# Patient Record
Sex: Female | Born: 1975 | Race: White | Hispanic: No | Marital: Single | Smoking: Never smoker
Health system: Southern US, Community
[De-identification: ages and names within clinical notes are randomized; demographics above are authoritative.]

## PROBLEM LIST (undated history)

## (undated) DIAGNOSIS — I10 Essential (primary) hypertension: Secondary | ICD-10-CM

## (undated) DIAGNOSIS — M5431 Sciatica, right side: Secondary | ICD-10-CM

## (undated) DIAGNOSIS — M1990 Unspecified osteoarthritis, unspecified site: Secondary | ICD-10-CM

## (undated) DIAGNOSIS — K279 Peptic ulcer, site unspecified, unspecified as acute or chronic, without hemorrhage or perforation: Secondary | ICD-10-CM

## (undated) DIAGNOSIS — F32A Depression, unspecified: Secondary | ICD-10-CM

## (undated) DIAGNOSIS — D649 Anemia, unspecified: Secondary | ICD-10-CM

## (undated) DIAGNOSIS — K219 Gastro-esophageal reflux disease without esophagitis: Secondary | ICD-10-CM

## (undated) DIAGNOSIS — N938 Other specified abnormal uterine and vaginal bleeding: Secondary | ICD-10-CM

## (undated) DIAGNOSIS — M47816 Spondylosis without myelopathy or radiculopathy, lumbar region: Secondary | ICD-10-CM

## (undated) DIAGNOSIS — G4730 Sleep apnea, unspecified: Secondary | ICD-10-CM

## (undated) DIAGNOSIS — M722 Plantar fascial fibromatosis: Secondary | ICD-10-CM

## (undated) DIAGNOSIS — F419 Anxiety disorder, unspecified: Secondary | ICD-10-CM

## (undated) HISTORY — DX: Anxiety disorder, unspecified: F41.9

## (undated) HISTORY — DX: Depression, unspecified: F32.A

## (undated) HISTORY — DX: Plantar fascial fibromatosis: M72.2

## (undated) HISTORY — PX: NO PAST SURGERIES: SHX2092

## (undated) HISTORY — DX: Other specified abnormal uterine and vaginal bleeding: N93.8

## (undated) HISTORY — DX: Peptic ulcer, site unspecified, unspecified as acute or chronic, without hemorrhage or perforation: K27.9

## (undated) HISTORY — DX: Gastro-esophageal reflux disease without esophagitis: K21.9

## (undated) HISTORY — DX: Essential (primary) hypertension: I10

## (undated) HISTORY — DX: Anemia, unspecified: D64.9

## (undated) HISTORY — PX: COLONOSCOPY: SHX174

## (undated) HISTORY — DX: Unspecified osteoarthritis, unspecified site: M19.90

## (undated) HISTORY — DX: Spondylosis without myelopathy or radiculopathy, lumbar region: M47.816

## (undated) HISTORY — DX: Sciatica, right side: M54.31

---

## 1994-10-21 HISTORY — PX: CHOLECYSTECTOMY: SHX55

## 2007-06-05 ENCOUNTER — Inpatient Hospital Stay: Payer: Self-pay | Admitting: Obstetrics and Gynecology

## 2007-06-05 ENCOUNTER — Observation Stay: Payer: Self-pay

## 2010-07-10 ENCOUNTER — Emergency Department: Payer: Self-pay | Admitting: Emergency Medicine

## 2011-04-03 IMAGING — CR DG ANKLE COMPLETE 3+V*L*
1 series · 5 of 5 positions shown · non-contrast
Comparison: none

REASON FOR EXAM: injury
COMMENTS:

PROCEDURE:     DXR - DXR ANKLE LEFT COMPLETE  - July 10, 2010 [DATE]
RESULT:     No fracture, dislocation or other acute bony abnormality is
identified. The ankle mortise is well maintained.

[Series 1: view not recorded · 0.17mm/px · 5 of 5 slices shown]
[im 1/5]
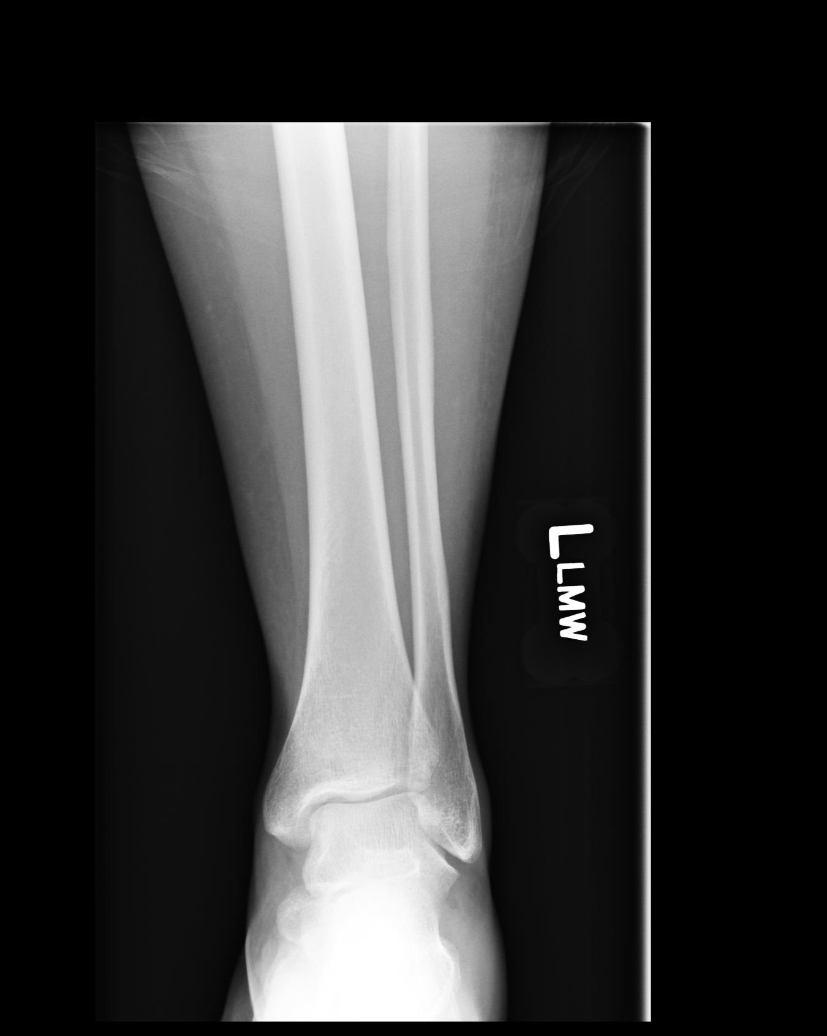
[im 2/5]
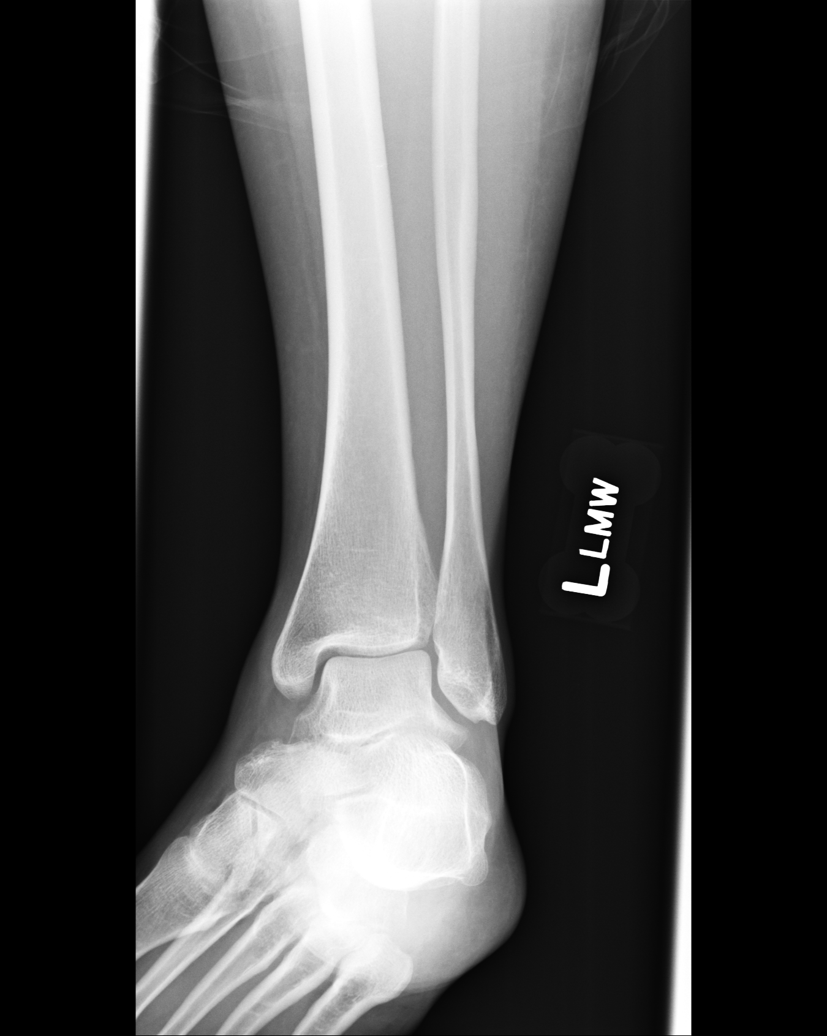
[im 3/5]
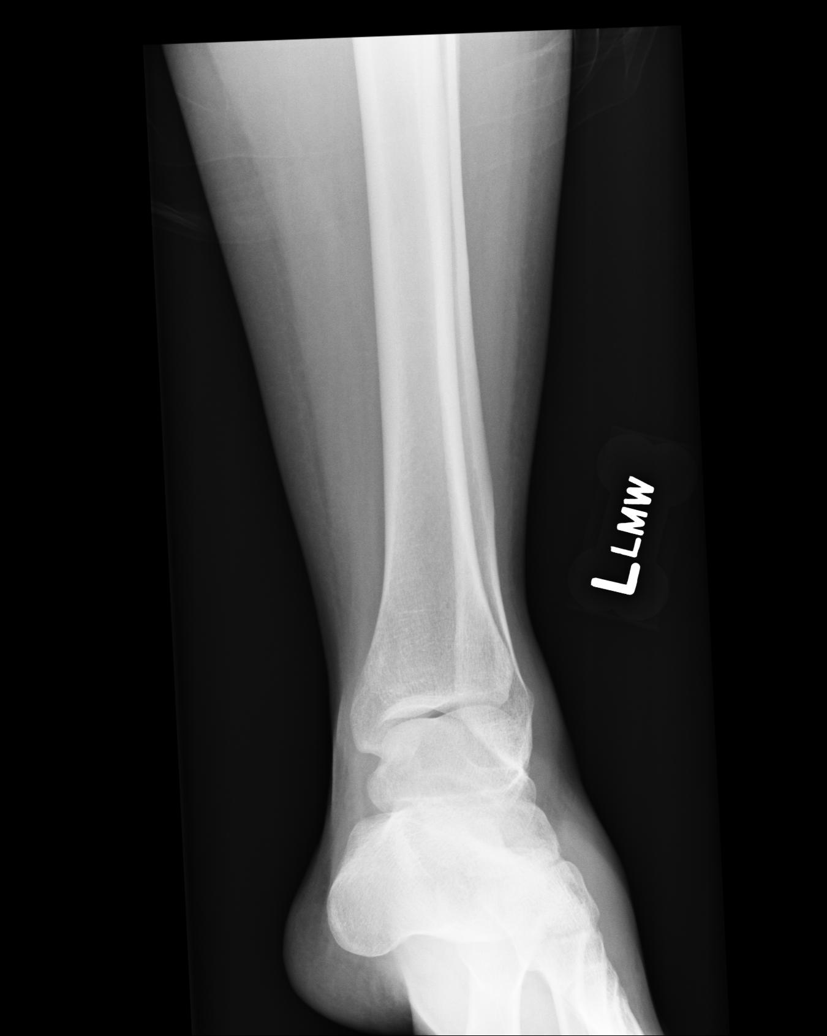
[im 4/5]
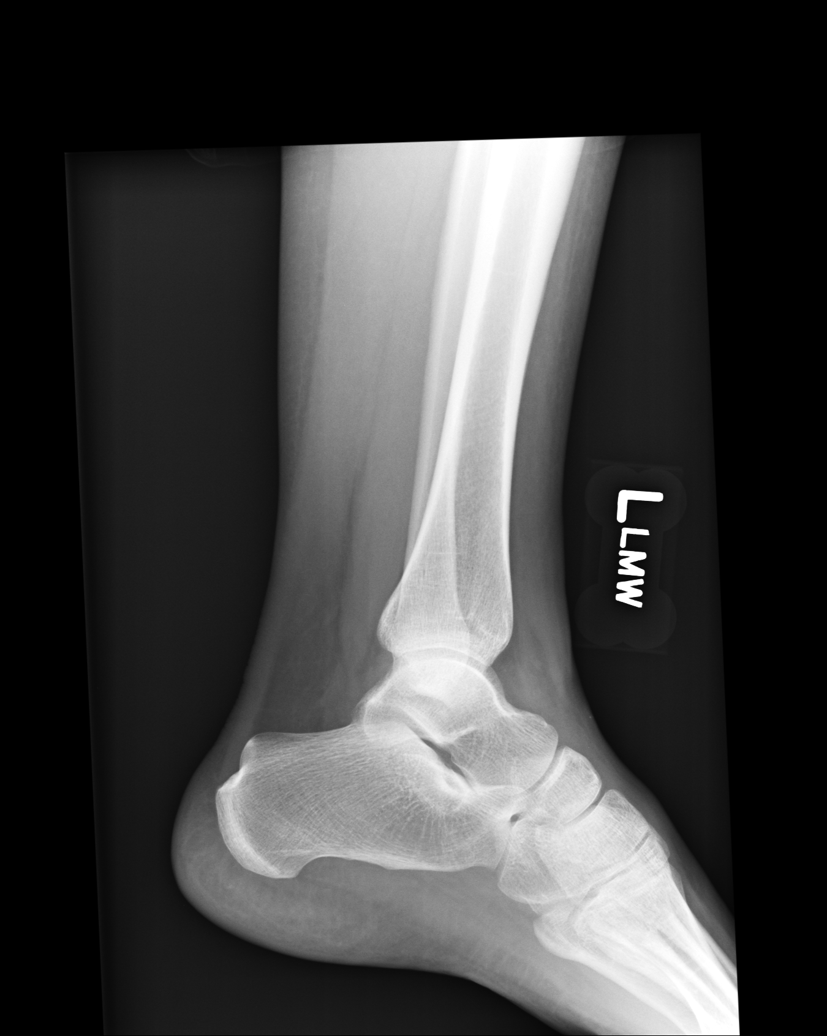
[im 5/5]
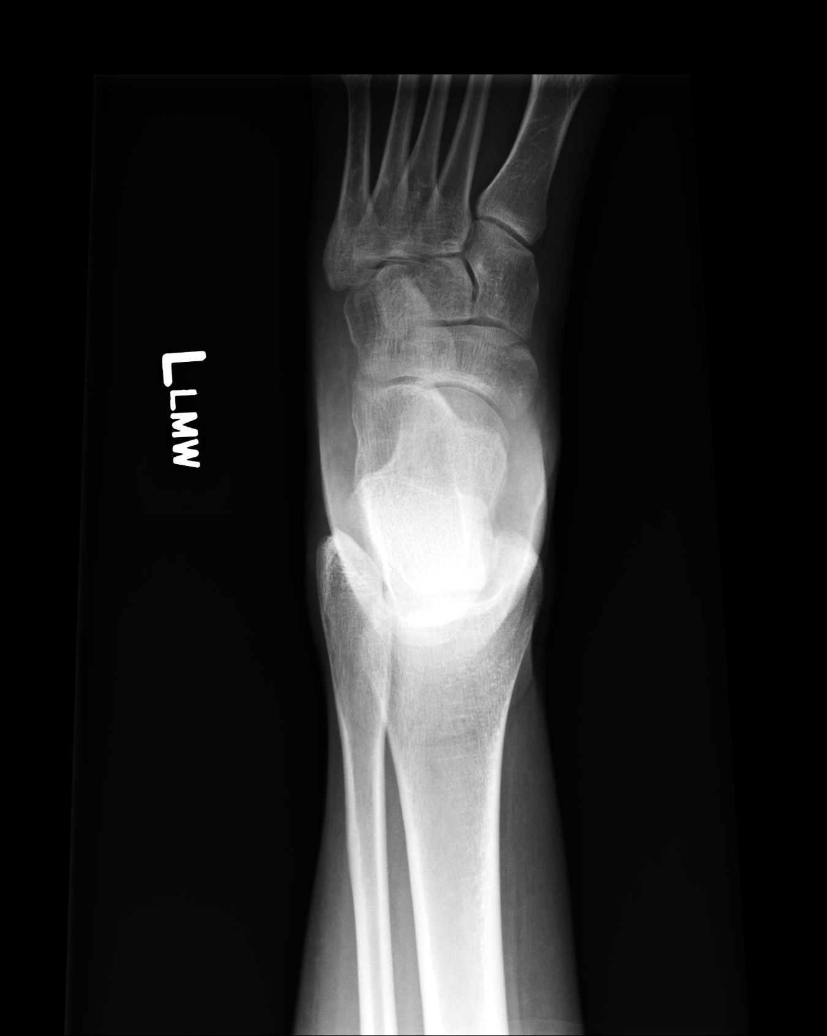

[5 of 5 positions shown; findings below may reference images not displayed]

IMPRESSION: No significant abnormalities are noted.

## 2015-01-24 HISTORY — PX: ANTERIOR CRUCIATE LIGAMENT REPAIR: SHX115

## 2015-10-04 ENCOUNTER — Other Ambulatory Visit: Payer: Self-pay | Admitting: Gastroenterology

## 2015-12-08 ENCOUNTER — Emergency Department
Admission: EM | Admit: 2015-12-08 | Disposition: A | Discharge status: Home / Self Care | Payer: Self-pay | Source: Ambulatory Visit | Attending: Emergency Medicine | Admitting: Emergency Medicine

## 2015-12-08 LAB — HM HIV SCREENING OFFERED

## 2015-12-08 MED ORDER — LIDOCAINE 5 % EX PTCH *I*
1.0000 | MEDICATED_PATCH | CUTANEOUS | Status: DC
Start: 2015-12-08 — End: 2015-12-09
  Administered 2015-12-08: 1 via TRANSDERMAL
  Filled 2015-12-08: qty 1

## 2015-12-08 MED ORDER — OXYCODONE HCL 5 MG PO TABS *I*
10.0000 mg | ORAL_TABLET | Freq: Once | ORAL | Status: DC
Start: 2015-12-08 — End: 2015-12-08

## 2015-12-08 MED ORDER — ACETAMINOPHEN 500 MG PO TABS *I*
1000.0000 mg | ORAL_TABLET | Freq: Once | ORAL | Status: DC
Start: 2015-12-08 — End: 2015-12-09

## 2015-12-08 NOTE — ED Notes (Signed)
Pt presents to the d with c/o chronic lower back and RT knee pain. Pt states she recently relocated from [place]. [place] and she doesn't have a PCP. Pt states she takes Percocet for chronic pain.

## 2015-12-08 NOTE — ED Triage Notes (Signed)
Right knee and back pain / chronic issues / just moved from [place]        Triage Note   Charlett Lango, RN

## 2015-12-08 NOTE — First Provider Contact (Signed)
ED Medical Screening Exam Note    Initial provider evaluation performed by   ED First Provider Contact     Date/Time Event User Comments    12/08/15 1541 ED Provider First Contact Elowen Debruyn, [facility] ANN Initial Face to Face Provider Contact        Patient states she just moved here from [place], hx back and knee pain, now bothering her again, no new injury  Vital signs reviewed.    Orders placed:  ANALGESIA     Patient requires further evaluation.     Mads Borgmeyer ANN Aquilla, NP, 12/08/2015, 3:41 PM    Supervising physician Dr Karleen Hampshire was immediately available     Valarie Cones, Chales Abrahams, NP  12/08/15 1542

## 2015-12-08 NOTE — Discharge Instructions (Signed)
You were seen in the Emergency Department for back pain and knee pain. Even though we did not find any life-threatening process that would require you to stay in the hospital during this visit, if you feel worse or are not improving, we are open 24 hours a day/7 days a week and would be happy to re-evaluate you. It is important that you follow up your visit with a primary care provider in the next 24-48 hours. Please continue taking your home medications as prescribed unless otherwise directed.       Please return to the Emergency Department if:    You have pain that is not relieved with rest or medicine.   You have increasing pain going down into the legs or buttocks.   You have pain that does not improve in one week.   You have night pain.   You lose weight.   You have a fever or chills.      Or you if develop Any worsening or new concerning symptoms    Please follow up with your primary care provider as soon as you can.  A referral to primary care has been made for you.    Thank you for letting us take part in your care today.

## 2015-12-08 NOTE — ED Provider Notes (Addendum)
History     Chief Complaint   Patient presents with    Knee Pain     HPI Comments: [name] is a 40 y.o. female who  has no past medical history on file.  [name] presents with acute exacerbation of her chronic right knee pain and right lumbar back pain.  She reports that this is because she has run out of her Percocet prescription because she recently moved here from [place] and has not been his able to establish care and primary care provider here in [place].  She denies any new trauma to her knee or back, she reports that her pain is her typical pain when not treated with Percocets, she states that she needs a referral to primary care.  He states that her pain is achy and severe.    she  denies: focal weakness, loss of sensation, saddle paresthesia, bowel or bladder incontinence, recent back surgery, IV drug use, waking from sleep due to pain or night sweats.              History provided by:  Patient  Language interpreter used: No      No past medical history on file.     No past surgical history on file.  No family history on file.    Social History    has no tobacco, alcohol, drug, and sexual activity history on file.    Living Situation     Questions Responses    Patient lives with     Homeless     Caregiver for other family member     External Services     Employment     Domestic Violence Risk           Problem List   There is no problem list on file for this patient.      Review of Systems   Review of Systems   Constitutional: Negative for chills and fever.   HENT: Negative for ear pain and sore throat.    Eyes: Negative for photophobia and visual disturbance.   Respiratory: Negative for chest tightness and shortness of breath.    Cardiovascular: Negative for chest pain and palpitations.   Gastrointestinal: Negative for abdominal pain, nausea and vomiting.   Genitourinary: Negative for difficulty urinating and dysuria.   Musculoskeletal: Positive for back pain and joint swelling.    Neurological: Negative for dizziness and light-headedness.   Psychiatric/Behavioral: Negative for confusion and decreased concentration.       Physical Exam     ED Triage Vitals   BP Heart Rate Heart Rate (via Pulse Ox) Resp Temp Temp src SpO2 O2 Device O2 Flow Rate   12/08/15 1543 12/08/15 1543 12/08/15 1543 12/08/15 1543 12/08/15 1543 12/08/15 1543 12/08/15 1543 12/08/15 1543 --   156/92 87 87 18 36.5 C (97.7 F) TEMPORAL 99 % None (Room air)       Weight           12/08/15 1543           86.2 kg (190 lb)                    Physical Exam   Constitutional: She is oriented to person, place, and time. She appears well-developed and well-nourished. No distress.   HENT:   Head: Normocephalic and atraumatic.   Eyes: EOM are normal. Pupils are equal, round, and reactive to light.   Neck: Normal range of motion. Neck supple.  Cardiovascular: Normal rate and regular rhythm.    Pulmonary/Chest: Effort normal and breath sounds normal.   Abdominal: Soft. Bowel sounds are normal. She exhibits no distension. There is no tenderness.   Musculoskeletal: Normal range of motion. She exhibits tenderness (Of paraspinal lumbar musculature on the right.). She exhibits no edema.   Neurological: She is alert and oriented to person, place, and time.   Skin: Skin is warm and dry. She is not diaphoretic.   Nursing note and vitals reviewed.      Medical Decision Making        Initial Evaluation:  ED First Provider Contact     Date/Time Event User Comments    12/08/15 1541 ED Provider First Contact Glennon Mac ANN Initial Face to Face Provider Contact          Patient seen by me today 12/08/2015 at 1630    Assessment:  39 y.o.female comes to the ED with acute exacerbation of chronic back pain due to having run out of her Percocet prescription.    Denies any traumas, new symptoms or acutely concerning issues with back pain.    Differential includes: strain/sprain, osteoarthritis, vertebral fracture (no mechanism to suggest this), central  disc herniation (no extremity involvement, neurologically intact), discitis/epidural abscess (unlikely as patient is afebrile, nontoxic appearance, no posterior erythema, warmth or midline tenderness; no history of IVDU), cauda equina syndrome/cord compression (no bowel or bladder incontinence, normal perineal sensation)    Abdominal exam reassuring, doubt referred pain from intraabdominal pathology. H&P not consistent with renal colic. No rash to suggest zoster. Pain mechanically reproducible.     Findings consistent with lumbar muscular strain, ligamentous sprain. Clinically, acute on chronic back pain. No new symptoms that suggest advanced imaging required.      PLAN:  Disposition:  Patient is safe and stable for discharge.  We'll provide patient with referral to primary care to continue treatment here in [place] for her chronic back pain.  Patient is ambulatory.  Patient has phone she can call 911 with if needed.  Patient will be provided with return precautions prior to discharge.  She is amenable to this plan.        Renaldo Fiddler, DO         Renaldo Fiddler, DO  Resident  12/08/15 2251      Resident Attestation:     Patient seen by me on arrival date of 12/08/2015 at 1817    History:   I reviewed this patient, reviewed the resident's note and agree.  Exam:   I examined this patient, reviewed the resident's note and agree.    Decision Making:   I discussed with the resident his/her documented decision making  and agree.    This is a 40 year old female presenting with chronic right knee and lower back pain.  She is usually followed in [place] recently moved here and does not have a primary care provider.  She states that she is not able to get her insurance for at least several weeks and didn't know where else to go to get her chronic pain medications.  Patient has been without these medications reportedly for several weeks, since the beginning of the month.  She has no focal findings on  examination though she does have scoliosis and some paraspinal muscle spasms.  I discussed with the patient in the emergency department is not the place to come for chronic pain management however I would give her lidocaine patch and ibuprofen as needed.  She  verbalized understanding and agreement with plan and she was given PCP follow-up instructions and was discharged home.    Author Jasper Loser, MD       Jasper Loser, MD  12/09/15 [phone]

## 2015-12-08 NOTE — Progress Notes (Signed)
[facility] SOCIAL WORK  PHARMACY FORM     Todays date:  December 08, 2015    Patient Name: [name]      Medical Record #: [id number]   DOB: [birth year]  Patients Address: [address] [address]                Social Worker: Annamary Rummage, LMSW       Date of Service: December 08, 2015       Funding Source: [employer] Medication Assistance Fund  ___________________________________________________________________    Pharmacy Information:  Date/time sent: December 08, 2015     Time needed: asap    Patient Location: ED    Medication Pick-up Preference: Patient will pick up at the pharmacy    Pharmacy Contact:Donna  Social work signature: Annamary Rummage, LMSW  Supervisor/Manager Approval (if indicated):  Date:  (supervisor signature not required for Medicaid pending)

## 2015-12-09 ENCOUNTER — Encounter: Payer: Self-pay | Admitting: Emergency Medicine

## 2015-12-15 ENCOUNTER — Ambulatory Visit: Payer: Self-pay | Admitting: Primary Care

## 2015-12-15 ENCOUNTER — Encounter: Payer: Self-pay | Admitting: Primary Care

## 2015-12-15 ENCOUNTER — Telehealth: Payer: Self-pay

## 2015-12-15 VITALS — BP 140/82 | HR 74 | Ht 69.0 in | Wt 195.0 lb

## 2015-12-15 DIAGNOSIS — F32A Depression, unspecified: Secondary | ICD-10-CM

## 2015-12-15 DIAGNOSIS — W57XXXA Bitten or stung by nonvenomous insect and other nonvenomous arthropods, initial encounter: Secondary | ICD-10-CM

## 2015-12-15 DIAGNOSIS — M5431 Sciatica, right side: Secondary | ICD-10-CM | POA: Insufficient documentation

## 2015-12-15 DIAGNOSIS — M419 Scoliosis, unspecified: Secondary | ICD-10-CM | POA: Insufficient documentation

## 2015-12-15 DIAGNOSIS — N938 Other specified abnormal uterine and vaginal bleeding: Secondary | ICD-10-CM

## 2015-12-15 DIAGNOSIS — M545 Low back pain, unspecified: Secondary | ICD-10-CM | POA: Insufficient documentation

## 2015-12-15 DIAGNOSIS — M47816 Spondylosis without myelopathy or radiculopathy, lumbar region: Secondary | ICD-10-CM

## 2015-12-15 DIAGNOSIS — D649 Anemia, unspecified: Secondary | ICD-10-CM | POA: Insufficient documentation

## 2015-12-15 DIAGNOSIS — K279 Peptic ulcer, site unspecified, unspecified as acute or chronic, without hemorrhage or perforation: Secondary | ICD-10-CM | POA: Insufficient documentation

## 2015-12-15 DIAGNOSIS — I10 Essential (primary) hypertension: Secondary | ICD-10-CM | POA: Insufficient documentation

## 2015-12-15 DIAGNOSIS — G8929 Other chronic pain: Secondary | ICD-10-CM | POA: Insufficient documentation

## 2015-12-15 MED ORDER — MIRTAZAPINE 30 MG PO TABS *I*
30.0000 mg | ORAL_TABLET | Freq: Every evening | ORAL | 5 refills | Status: DC
Start: 2015-12-15 — End: 2016-01-12

## 2015-12-15 MED ORDER — GABAPENTIN 300 MG PO CAPSULE *I*
300.0000 mg | ORAL_CAPSULE | Freq: Three times a day (TID) | ORAL | 5 refills | Status: DC
Start: 2015-12-15 — End: 2015-12-15

## 2015-12-15 MED ORDER — GABAPENTIN 300 MG PO CAPSULE *I*
900.0000 mg | ORAL_CAPSULE | Freq: Three times a day (TID) | ORAL | 5 refills | Status: DC
Start: 2015-12-15 — End: 2015-12-23

## 2015-12-15 MED ORDER — CYCLOBENZAPRINE HCL 10 MG PO TABS *I*
10.0000 mg | ORAL_TABLET | Freq: Three times a day (TID) | ORAL | 5 refills | Status: DC | PRN
Start: 2015-12-15 — End: 2016-01-12

## 2015-12-15 MED ORDER — AMLODIPINE BESYLATE 10 MG PO TABS *I*
10.0000 mg | ORAL_TABLET | Freq: Every day | ORAL | 5 refills | Status: DC
Start: 2015-12-15 — End: 2016-01-12

## 2015-12-15 MED ORDER — BUPROPION HCL 150 MG PO TB12 *I*
150.0000 mg | ORAL_TABLET | Freq: Two times a day (BID) | ORAL | 5 refills | Status: DC
Start: 2015-12-15 — End: 2016-01-12

## 2015-12-15 MED ORDER — FERROUS SULFATE 325 (65 FE) MG PO TABS *WRAPPED* *I*
325.0000 mg | ORAL_TABLET | Freq: Every day | ORAL | 3 refills | Status: DC
Start: 2015-12-15 — End: 2016-01-12

## 2015-12-15 MED ORDER — OMEPRAZOLE 40 MG PO CPDR *I*
40.0000 mg | DELAYED_RELEASE_CAPSULE | Freq: Every day | ORAL | 5 refills | Status: DC
Start: 2015-12-15 — End: 2016-01-12

## 2015-12-15 MED ORDER — LISINOPRIL 10 MG PO TABS *I*
10.0000 mg | ORAL_TABLET | Freq: Every day | ORAL | 5 refills | Status: DC
Start: 2015-12-15 — End: 2016-01-12

## 2015-12-15 NOTE — Telephone Encounter (Addendum)
Ms. Mallery is calling to schedule an appointment with office. The patient would like to be seen for NPV, peptic ulcer, next available.  Ms. Bettcher was scheduled for 6-7, Sherri Rad, wait listed. Please call the patient to schedule at [phone].  Thank you.

## 2015-12-15 NOTE — Progress Notes (Signed)
[facility] - Outpatient Progress Note  New Patient Visit    SUBJECTIVE    CC:  Pt here to establish care and discuss New Patient Visit; Back Pain (bulging disc and scoliosis, used to take percocet doesn't have any more); Hypertension; Depression; and Bed Bugs (staying in shelter)    Previous PCP:  Sharee Pimple, Family Doctor     Recently moved to [place] from [place].  Present with fianc Willie at today's visit.    1. HTN (hypertension)    2. DJD (degenerative joint disease), lumbar    3. Chronic low back pain    4. Sciatica of right side    5. Scoliosis of lumbar spine    6. Depression, unspecified depression type    7. Peptic ulcer    8. Anemia, unspecified type    9. DUB (dysfunctional uterine bleeding)      HTN:   Diagnosed approx 5 years ago.  Takes Amlodipine and Lisinopril.  Compliant every day.    R ACL tear:  Fell at work.  Repaired 01/24/15.  Still has buckling and pain with weather changes. Completed 10 months of PT with some improvement.    Chronic low back pain/Sciatica:  Made worse when she tore her R ACL due to compensated walking.  Managed in [place] by neurology and had prior injections, PT and TENS unit.  Was going to consider nerve ablation prior to her moving here.  Still feels pain with catching during certain movements.  Had sciatica initially but now can better control with PT exercises.  Takes Gabapentin and Flexeril.  Was also given Percocet 10-325 mg.       MRI reviewed from 03/17/15 shows thoracolumbar scoliosis with multiple small disc abnormalities as described.  No significant canal stenosis.  Generalized overall mild posterior element DJD.    Snoring:  Snores loudly. Wakes up not rested. Was going to go for sleep study but moved.    Peptic ulcer:  Diagnosed by EGD in  2011.  Takes daily omeprazole daily.    Chest pain:   Feels like knife in the middle of her chest every other month.  Lasting 15-20 min.  Rest makes the pain better.  Moving makes pain  worse. Denies palpitations.  Denies LE swelling.  Has been worked up and had EGD showed ulcer and was told her pain was from the ulcer.  She cannot correlate the pain with food.  Pain comes out of nowhere.  Eating bread helps with pain.    Depression/Anxiety:  Takes Remeron and Wellbutrin.  Helps greatly with mood. Denies any SI.    Anemia:  Since childhood.  Has heavy bleeding. Takes Iron.    DUB:  Has irregular heavy bleeding.  Had tubal ligation.  Has known fibroids. Was going to be given 'hormones to knock out period' prior to moving but never took them.    Hordoleum:  L eyelid 'spot' that sometimes leaks pus.  Was supposed to go and see an eye doctor prior to moving.    Hx of colon cancer in father:  Diagnosed at 48. Died at 68.  She has not yet had a colonoscopy.    Bedbugs: Being bitten by bedbugs at the shelter she is currently staying.  Has application in for section 8 housing that should take a couple of months.    Social:  Moved recently from [place]. [place] to 'start over'.  Has a cousin in [place].  Currently in a shelter and has  pending disability case for low back pain secondary to DJD, small disc disease and sciatica. Here with fiance Huel Coventry, partner of 2 years. 5 children living with grandparents currently as she was unable to properly care for them.    The patient's medication list was populated in eRecord and reviewed by myself today.  These medications include:    Current Outpatient Prescriptions:     amLODIPine (NORVASC) 10 MG tablet, Take 1 tablet (10 mg total) by mouth daily, Disp: 30 tablet, Rfl: 5    buPROPion (WELLBUTRIN SR) 150 MG 12 hr tablet, Take 1 tablet (150 mg total) by mouth 2 times daily   Swallow whole. Do not crush, break, or chew., Disp: 30 tablet, Rfl: 5    cyclobenzaprine (FLEXERIL) 10 MG tablet, Take 1 tablet (10 mg total) by mouth 3 times daily as needed for Muscle spasms, Disp: 30 tablet, Rfl: 5    ferrous sulfate 325 (65 FE) MG tablet, Take 1 tablet (325 mg total) by  mouth daily (with breakfast), Disp: 100 tablet, Rfl: 3    lisinopril (PRINIVIL,ZESTRIL) 10 MG tablet, Take 1 tablet (10 mg total) by mouth daily, Disp: 30 tablet, Rfl: 5    mirtazapine (REMERON) 30 MG tablet, Take 1 tablet (30 mg total) by mouth nightly, Disp: 30 tablet, Rfl: 5    omeprazole (PRILOSEC) 40 MG capsule, Take 1 capsule (40 mg total) by mouth daily, Disp: 30 capsule, Rfl: 5    gabapentin (NEURONTIN) 300 MG capsule, Take 3 capsules (900 mg total) by mouth 3 times daily, Disp: 150 capsule, Rfl: 5    oxyCODONE-acetaminophen (PERCOCET) 10-325 MG per tablet, Take 1 tablet by mouth every 4-6 hours as needed for Pain, Disp: , Rfl:       Allergies were populated in eRecord and reviewed by myself today.  These allergies include:  No Known Allergies (drug, envir, food or latex)      Past medical, surgical, social and family histories were updated in eRecord and reviewed by myself today.  This history includes:    Past Medical History:   Diagnosis Date    Anemia     Arthritis     Depression     DJD (degenerative joint disease), lumbar     small disc disease    DUB (dysfunctional uterine bleeding)     GERD (gastroesophageal reflux disease)     Hypertension     Peptic ulcer     Sciatica of right side        Past Surgical History:   Procedure Laterality Date    ANTERIOR CRUCIATE LIGAMENT REPAIR Right 01/24/2015    CESAREAN SECTION, LOW TRANSVERSE  2007    CHOLECYSTECTOMY  1996       Family History   Problem Relation Age of Onset    Brain cancer Mother     Colon cancer Father 86    Asthma Brother        Social History:   reports that she has been smoking.  She has been smoking about 0.25 packs per day. She has never used smokeless tobacco. She reports that she currently engages in sexual activity and has had female partners. She reports using the following method of birth control/protection: Surgical. She reports that she does not drink alcohol or use illicit drugs.      ROS: negative except where  bolded  Gen:  No headaches, dizziness and feels generally well  Eyes: No recent visual changes  HENT:  No nasal discharge, throat pain, hearing  unchanged  CV:  No palpitations or chest pain, peripheral edema or claudication  Resp: No wheezing or shortness of breath  GI:  No nausea, vomiting; eating and drinking normally  GU:  Normal urination and bowel movements  MSK: No joint or muscle aches, normal gait  Skin:  No new rashes or lesions  Neuro: No paresthesias  Psych:  Mood stable  Endo: No extraordinary fatigue, skin/hair changes  Heme: No easy bruising    OBJECTIVE    Blood pressure 140/82, pulse 74, height 1.753 m ( ), weight 88.5 kg (195 lb).      PHYSICAL EXAM:    Vitals as noted; WD alert and in NAD   Psych:  A&Ox3; normal mood and affect   Eyes:  Conjunctiva clear, lids normal   PERRLA with otoscope   ENMT:  TMs normal with limited cerumen in canal   Normal dentition and no lesions of lips   Neck:  Trachea midline, no masses palpated, no thyromegaly   Lymph:  no submandibular or supraclavicular LAD   Resp:  Normal effort; and clear to auscultation without wheeze   Cards: S1S2 regular without murmur; no pedal edema   Abd:  Soft, NT without obvious masses, no hepatosplenomegaly, and NABS   Normal patellar reflex with nonantalgic gait   MSK: normal forward flexion, limited lateral bending on R and L, obvious lumbar scoliosis with L hip higher than left hip, TTP along lumbar spine and R paraspinal muscles, pos SLR R   Skin: scattered erythematous papules over face and neck      ASSESSMENT & PLAN    1. HTN (hypertension)  At upper end of goal during today's visit however patient in pain.  Will continue to monitor clinically.  Refill medications as below.  - amLODIPine (NORVASC) 10 MG tablet; Take 1 tablet (10 mg total) by mouth daily  Dispense: 30 tablet; Refill: 5  - lisinopril (PRINIVIL,ZESTRIL) 10 MG tablet; Take 1 tablet (10 mg total) by mouth daily  Dispense: 30 tablet; Refill: 5    2. DJD  (degenerative joint disease), lumbar/ Chronic low back pain/R sciatica/Scoliosis  Homero Fellers discussion with patient that I'll not be prescribing narcotics for pain to which she agrees without any objection.  Increase gabapentin to 900 mg 3 times a day.  Referral to pain management for alternative modalities for pain management  AMB REFERRAL TO PAIN TREATMENT  - gabapentin (NEURONTIN) 300 MG capsule; Take 3 capsules (900 mg total) by mouth 3 times daily  Dispense: 150 capsule; Refill: 5    3. Depression, unspecified depression   Stable.  Refill meds as below.  -buPROPion (WELLBUTRIN SR) 150 MG 12 hr tablet; Take 1 tablet (150 mg total) by mouth 2 times daily   Swallow whole. Do not crush, break, or chew.  Dispense: 30 tablet; Refill: 5  - mirtazapine (REMERON) 30 MG tablet; Take 1 tablet (30 mg total) by mouth nightly  Dispense: 30 tablet; Refill: 5    4. Hx of Peptic ulcer?/ Family history of early colon cancer in father   Requested records for review.  Continue daily omeprazole.  Referral to GI for endoscopy and colonoscopy.  -omeprazole (PRILOSEC) 40 MG capsule; Take 1 capsule (40 mg total) by mouth daily  Dispense: 30 capsule; Refill: 5  - AMB REFERRAL TO GASTROENTEROLOGY    5. Anemia  Will obtain labs .  Cont with iron supp.    6. DUB (dysfunctional uterine bleeding)  Will likely require obgyn referral.  Will address  in more depth at future visit.  Records requested for review.    7. Bug bites:  Advised discussing with Dealer.  Steroid cream and benadryl prn.    RTC:  1 month multiple issues    Marin Olp, MD

## 2015-12-18 ENCOUNTER — Telehealth: Payer: Self-pay | Admitting: Primary Care

## 2015-12-18 DIAGNOSIS — M545 Low back pain, unspecified: Secondary | ICD-10-CM

## 2015-12-18 DIAGNOSIS — G8929 Other chronic pain: Secondary | ICD-10-CM

## 2015-12-18 NOTE — Telephone Encounter (Signed)
Prior authorization request for Gabapentin received from [facility]. Placing in nurses' bin.

## 2015-12-18 NOTE — Telephone Encounter (Signed)
Patient called because she was told she needs prior authorization for Gabapentin, and states she is still in a lot of pain. She also wanted to inform the doctor that she called pain management and they said they would call her once they looked over her "records".      She can be reached at [phone] temporarily.

## 2015-12-18 NOTE — Telephone Encounter (Signed)
Frequency of the dose (3x daily) for gabapentin 300 mg may be triggering the PA, but it may require one anyway. No obvious alternatives on the formulary. Form started, placed on provider's desk.

## 2015-12-18 NOTE — Telephone Encounter (Signed)
Prior authorization request for Omeprazole received from [facility]. Placing in nurses' bin.

## 2015-12-18 NOTE — Telephone Encounter (Signed)
Review of Medicaid formulary shows that the dose of 40 mg is triggering the PA. Given the diagnosis of peptic ulcer, this should go through. Form started, placed on provider's desk

## 2015-12-21 ENCOUNTER — Telehealth: Payer: Self-pay | Admitting: Primary Care

## 2015-12-21 NOTE — Telephone Encounter (Signed)
[name] is calling to check the status of her appointment request. I see an NPV scheduled for 03/26/16 with Dorathy Daft  [name]'s provider is requesting for this to be an Urgent referral for her to be seen sooner than that.   Please return her call at [phone] or [phone] (at the [facility]) to discuss.   Connected to RIM

## 2015-12-21 NOTE — Telephone Encounter (Signed)
Prior auth filled out for gabapentin and will be sent today.  Checked of expedited review.    I need patient's prior records in order for me to better assess pain quality and how to treat.  For now all I am comfortable prescribing is the gabapentin that needs the prior auth.    Would front staff call patient.    Marin Olp, MD

## 2015-12-21 NOTE — Telephone Encounter (Signed)
Pt called to let dr Ezzie Dural know that pain mgmt wont see her until they get her previous  Pain dr records( faxed ROI earlier in week to all that you had wanted) she is in a lot  Of pain,only medicine she could get was the flexual,but she is in a [facility] shelter and it  Just puts her to sleep. She is wondering if you can prescribe her anything else for her pain?  Please advise.thanks

## 2015-12-21 NOTE — Telephone Encounter (Signed)
Returned call, left message on vcm, called [employer], placed on hold then call disconnected

## 2015-12-21 NOTE — Telephone Encounter (Signed)
Patient has returned call regarding an what she reports to be an urgent referral. She has asked that we call her back at [phone] to move the 6/6 appointment sooner. The patient gives permission to leave a new appointment date/time on her voicemail.

## 2015-12-21 NOTE — Telephone Encounter (Signed)
[name] is returning a call from Nettie. She states this is regarding scheduling a sooner appointment. She is requesting a call back at [phone].  Thank you.

## 2015-12-21 NOTE — Telephone Encounter (Signed)
Prior auth filled out and placed in nurses bin.  Checked off as urgent.    Marin Olp, MD

## 2015-12-21 NOTE — Telephone Encounter (Signed)
PA faxed to [phone], awaiting response.

## 2015-12-21 NOTE — Telephone Encounter (Signed)
Called pt back to let her know dr's msg. She was thankful,and would like a call  Back when we receive her records/medication response from [employer].

## 2015-12-22 ENCOUNTER — Telehealth: Payer: Self-pay | Admitting: Primary Care

## 2015-12-22 NOTE — Telephone Encounter (Signed)
Approval of gabapentin 300 mg capsules received from [employer]/Medicaid, good until 06/23/2016. Placed in nursing staff inbox.

## 2015-12-22 NOTE — Telephone Encounter (Signed)
Approval of gabapentin 300 mg capsules received from [employer]/Medicaid, good until 06/23/2016. Placed in nursing staff inbox.

## 2015-12-22 NOTE — Telephone Encounter (Signed)
Approval message below should be added to current PA encounter please

## 2015-12-22 NOTE — Telephone Encounter (Signed)
Patient received her script for Gabapentin, and it states to take 1 pill three times daily instead of 3 tablets three times daily. Patient states she was told/her AVS said 3 tablets three times a day. They only gave her 90 tablets instead of 150.    She also states that pain management cannot see her without her records from [place]. Writer advised her to call her [facility]'s [facility] and check on the status of the records being sent.    She is requesting a call back about the Gabapentin at [phone].

## 2015-12-22 NOTE — Telephone Encounter (Signed)
This is encounter for omeprazole, please docu gaba approval in other PA encounter

## 2015-12-22 NOTE — Telephone Encounter (Signed)
Records received [facility], includes information from Dr. Vangie BickerNaso. Placed in provider's inbox.

## 2015-12-22 NOTE — Telephone Encounter (Signed)
Received approval for Gabapentin 300mg  beginning 12/21/15 ending 06/23/16.

## 2015-12-22 NOTE — Telephone Encounter (Signed)
PA faxed to Medicaid at [phone]. Awaiting response.

## 2015-12-23 MED ORDER — GABAPENTIN 300 MG PO CAPSULE *I*
900.0000 mg | ORAL_CAPSULE | Freq: Three times a day (TID) | ORAL | 5 refills | Status: DC
Start: 2015-12-23 — End: 2016-01-12

## 2015-12-23 NOTE — Addendum Note (Signed)
Addended by: Marin OlpLIS-HYJEK, Montine Hight on: 12/23/2015 10:58 AM     Modules accepted: Orders

## 2015-12-23 NOTE — Telephone Encounter (Signed)
Medication 300 mg, 3 capsules tid resent to pharmacy.    Marin OlpKarolina Lis-Hyjek, MD

## 2015-12-23 NOTE — Telephone Encounter (Signed)
Called patient 3 x yesterday and eventually left VM stating she could take the gabapentin 900 mg tid as discussed during our visit.  Prior authorization must have gone through with wrong dosage.    Will need to redo prior auth on Monday.      Message sent to nurses.    Marin OlpKarolina Lis-Hyjek, MD

## 2015-12-27 NOTE — Telephone Encounter (Signed)
Called Medicaid at [phone], this has been approved, good until 06/28/2016. [facility] [address], left VM that this has been approved, asking them to contact the patient once this is ready.

## 2016-01-05 ENCOUNTER — Telehealth: Payer: Self-pay | Admitting: Pain Medicine

## 2016-01-05 ENCOUNTER — Telehealth: Payer: Self-pay | Admitting: Primary Care

## 2016-01-05 NOTE — Telephone Encounter (Addendum)
Pt called to let dr know that she cant function on 900 mg of gabapentin 3x a day.  She says it messes with her equilibrium ,and doesn't know if she can keep taking it.  And asked about pain mgmt referral, ( i told her i would call) called them and they confirmed  Received records. Will forward to NP and they will call and get her scheduled.

## 2016-01-05 NOTE — Telephone Encounter (Signed)
Additional info you were requesting before accepting patient has been scanned under her media.  Please advise.

## 2016-01-08 NOTE — Telephone Encounter (Signed)
I reviewed previous records.  Before we see her for her back pain, it looks like she needs to see gyn for hemorrhagic cysts that may be contributing to her back pain.    Once she has seen GYN, we can consider seeing her.

## 2016-01-08 NOTE — Telephone Encounter (Signed)
Would nursing staff please tell patient that pain management has rejected the referral until we can get patient seen for possible hemorrhagic cysts that may be contributing to her back pain.    She can wait to discuss this on our 3/27 appointment or if she would like can come in for an acute this week.    Thanks,  Marin OlpKarolina Lis-Hyjek, MD

## 2016-01-09 ENCOUNTER — Ambulatory Visit: Payer: Self-pay | Admitting: Primary Care

## 2016-01-09 NOTE — Telephone Encounter (Signed)
Called and left voice message for patient to call.

## 2016-01-09 NOTE — Telephone Encounter (Signed)
Patient notified of message below, patient verbalized understanding.  Appointment scheduled for today.

## 2016-01-12 ENCOUNTER — Ambulatory Visit: Payer: Self-pay | Admitting: Primary Care

## 2016-01-12 DIAGNOSIS — G4733 Obstructive sleep apnea (adult) (pediatric): Secondary | ICD-10-CM

## 2016-01-12 DIAGNOSIS — M797 Fibromyalgia: Secondary | ICD-10-CM

## 2016-01-12 DIAGNOSIS — F32A Depression, unspecified: Secondary | ICD-10-CM

## 2016-01-12 DIAGNOSIS — F419 Anxiety disorder, unspecified: Secondary | ICD-10-CM

## 2016-01-12 DIAGNOSIS — I10 Essential (primary) hypertension: Secondary | ICD-10-CM

## 2016-01-12 DIAGNOSIS — F17200 Nicotine dependence, unspecified, uncomplicated: Secondary | ICD-10-CM | POA: Insufficient documentation

## 2016-01-12 DIAGNOSIS — M545 Low back pain, unspecified: Secondary | ICD-10-CM

## 2016-01-12 DIAGNOSIS — G8929 Other chronic pain: Secondary | ICD-10-CM

## 2016-01-12 DIAGNOSIS — M47816 Spondylosis without myelopathy or radiculopathy, lumbar region: Secondary | ICD-10-CM

## 2016-01-12 DIAGNOSIS — G43909 Migraine, unspecified, not intractable, without status migrainosus: Secondary | ICD-10-CM

## 2016-01-12 DIAGNOSIS — K279 Peptic ulcer, site unspecified, unspecified as acute or chronic, without hemorrhage or perforation: Secondary | ICD-10-CM

## 2016-01-12 HISTORY — DX: Nicotine dependence, unspecified, uncomplicated: F17.200

## 2016-01-12 HISTORY — DX: Fibromyalgia: M79.7

## 2016-01-12 HISTORY — DX: Anxiety disorder, unspecified: F41.9

## 2016-01-12 MED ORDER — FERROUS SULFATE 325 (65 FE) MG PO TABS *WRAPPED* *I*
325.0000 mg | ORAL_TABLET | Freq: Every day | ORAL | 3 refills | Status: DC
Start: 2016-01-12 — End: 2021-12-17

## 2016-01-12 MED ORDER — GABAPENTIN 300 MG PO CAPSULE *I*
600.0000 mg | ORAL_CAPSULE | Freq: Three times a day (TID) | ORAL | 5 refills | Status: DC
Start: 2016-01-12 — End: 2016-06-25

## 2016-01-12 MED ORDER — CYCLOBENZAPRINE HCL 10 MG PO TABS *I*
10.0000 mg | ORAL_TABLET | Freq: Three times a day (TID) | ORAL | 5 refills | Status: DC | PRN
Start: 2016-01-12 — End: 2016-06-25

## 2016-01-12 MED ORDER — VENLAFAXINE HCL 37.5 MG PO CP24 *I*
37.5000 mg | ORAL_CAPSULE | Freq: Every day | ORAL | 3 refills | Status: DC
Start: 2016-01-12 — End: 2016-06-25

## 2016-01-12 MED ORDER — LISINOPRIL 10 MG PO TABS *I*
10.0000 mg | ORAL_TABLET | Freq: Every day | ORAL | 5 refills | Status: DC
Start: 2016-01-12 — End: 2016-06-25

## 2016-01-12 MED ORDER — OMEPRAZOLE 40 MG PO CPDR *I*
40.0000 mg | DELAYED_RELEASE_CAPSULE | Freq: Every day | ORAL | 5 refills | Status: DC
Start: 2016-01-12 — End: 2017-09-30

## 2016-01-12 MED ORDER — AMLODIPINE BESYLATE 10 MG PO TABS *I*
10.0000 mg | ORAL_TABLET | Freq: Every day | ORAL | 5 refills | Status: DC
Start: 2016-01-12 — End: 2021-12-17

## 2016-01-12 MED ORDER — MIRTAZAPINE 30 MG PO TABS *I*
30.0000 mg | ORAL_TABLET | Freq: Every evening | ORAL | 5 refills | Status: DC
Start: 2016-01-12 — End: 2016-06-25
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
## 2016-01-15 ENCOUNTER — Ambulatory Visit: Payer: Self-pay | Admitting: Primary Care

## 2016-01-15 ENCOUNTER — Telehealth: Payer: Self-pay | Admitting: Primary Care

## 2016-01-15 NOTE — Telephone Encounter (Addendum)
If pt calls back she needs to set up appt with an obgyn dr.(called [facility]) here in [place].

## 2016-01-15 NOTE — Telephone Encounter (Signed)
As I discussed with patient at our visit:    Pain medicine attempted to call her and left a VM on her partner's phone. There is an encounter from 01/05/16 with missed call on chart review.     She has to call them back to set up an appointment.  Number from google is:    [phone]  Would nursing staff please call her with this info.    Thanks,  Marin OlpKarolina Lis-Hyjek, MD

## 2016-01-15 NOTE — Telephone Encounter (Signed)
Please advise 

## 2016-01-15 NOTE — Telephone Encounter (Signed)
Medicaid approved venlafaxine 37.5mg  with 3 refills. Approval# [id number]. [facility] [address] [facility].

## 2016-01-15 NOTE — Telephone Encounter (Signed)
Please see message below per PCP

## 2016-01-15 NOTE — Telephone Encounter (Signed)
Dr Ezzie Durallis- pt called back saying she has already seen ob and the records are in scanning  But the pain clinic is stating they need her to see the ob. She doesn't have one here in [place].  (i  feel like i am stuck in the middle and not sure what else to do for her?)

## 2016-01-15 NOTE — Telephone Encounter (Signed)
I confirmed with NP Joellyn HaffMichelle Dugan that patient in fact does NOT need to see an obgyn prior to being seen by pain medicine because ultrasound showed no hemorrhagic cysts in 6/16.    Initial MRI showed possible cysts in 5/16 but f/u in 6/16 with pelvic ultrasound excluded that diagnosis.    I have communicated with NP Evert KohlDugan in regards to this and she agreed for her to be seen.    Perhaps we can call the pain center to facilitate in this referral given this information since it seems that something else is being communicated to the patient.      Marin OlpKarolina Lis-Hyjek, MD

## 2016-01-15 NOTE — Telephone Encounter (Signed)
PA for venlafaxine hcl er 37.5 mg cap

## 2016-01-15 NOTE — Telephone Encounter (Signed)
Pt called asking about her pain management referral. She was here on Friday 01/12/16 and   Said her and dr.lis discussed that she was already seen by obgyn. She is calling asking  If dr.lis has contacted [facility]? Please advise.thanks

## 2016-01-15 NOTE — Telephone Encounter (Signed)
Please review, pt NOS for visit today.

## 2016-01-15 NOTE — Telephone Encounter (Signed)
Left vm for pt to call office back and ask for nurse.

## 2016-01-23 ENCOUNTER — Ambulatory Visit: Payer: Self-pay | Admitting: Pain Medicine

## 2016-01-23 ENCOUNTER — Encounter: Payer: Self-pay | Admitting: Pain Medicine

## 2016-01-23 VITALS — BP 127/74 | HR 104 | Resp 18 | Ht 68.0 in | Wt 206.0 lb

## 2016-01-23 DIAGNOSIS — G894 Chronic pain syndrome: Secondary | ICD-10-CM

## 2016-01-23 DIAGNOSIS — M47816 Spondylosis without myelopathy or radiculopathy, lumbar region: Secondary | ICD-10-CM

## 2016-01-23 DIAGNOSIS — Z79899 Other long term (current) drug therapy: Secondary | ICD-10-CM

## 2016-01-23 DIAGNOSIS — G8929 Other chronic pain: Secondary | ICD-10-CM

## 2016-01-23 DIAGNOSIS — M419 Scoliosis, unspecified: Secondary | ICD-10-CM

## 2016-01-23 DIAGNOSIS — M545 Low back pain, unspecified: Secondary | ICD-10-CM

## 2016-01-23 DIAGNOSIS — M5431 Sciatica, right side: Secondary | ICD-10-CM

## 2016-01-23 LAB — PAIN CLINIC PROFILE
Amphetamine,UR: NEGATIVE
Benzodiazepinen,UR: NEGATIVE
Cocaine/Metab,UR: NEGATIVE
Opiates,UR: NEGATIVE
Oxycodone/Oxymorphone,UR: NEGATIVE
THC Metabolite,UR: NEGATIVE

## 2016-01-23 MED ORDER — DICLOFENAC SODIUM 1 % EX GEL *I*
CUTANEOUS | 5 refills | Status: DC
Start: 2016-01-23 — End: 2016-06-25

## 2016-01-23 NOTE — Patient Instructions (Signed)
1. She requires thoracic and lumbar  MRI to assess for disc herniation, stenosis, fracture, anatomical pathology contributing to pain.  Previous conservative treatments were not effective in addressing the patient's symptoms. At least six weeks of physical therapy/chiropractic care, six weeks of reduced activities have been tried without improvement in symptoms.     2. Voltarin apply 4 times a day, She was given a prescription for this    3. PCP could consider trying tizanidine    4. She was started on venlafaxine this could be increased for pain    5 We [facility] the [facility]

## 2016-01-23 NOTE — Progress Notes (Signed)
01/23/2016    It was a pleasure to see [name] [birth year] [id number], today in consultation for pain assessment and recommendations. She was seen at the [facility] at the [place] [phone]) at the request of Dr. Synetta Shadow.  She was seen and examined by Cammy Copa, MSN, FNP-BC.     The plan and impression have been placed at the beginning of this note as a courtesy to the reader.    IMPRESSION:  [name] is a 40 y.o. y.o.pleasant, cooperative, neatly groomed female with chronic pain       Working diagnosis;   1. DJD (degenerative joint disease), lumbar  MR spine thoracic without contrast    MR lumbar spine without contrast    AMB REFERRAL TO PHYS / OCC THERAPY   2. Chronic low back pain  AMB REFERRAL TO PHYS / OCC THERAPY   3. Sciatica of right side  MR spine thoracic without contrast    MR lumbar spine without contrast    AMB REFERRAL TO PHYS / OCC THERAPY   4. Scoliosis of lumbar spine  AMB REFERRAL TO PHYS / OCC THERAPY   5. Scoliosis of thoracolumbar spine, unspecified scoliosis type  MR spine thoracic without contrast    MR lumbar spine without contrast    AMB REFERRAL TO PHYS / OCC THERAPY   6. Chronic pain syndrome  Pain clinic profile    Pain clinic profile   7. Encounter for chronic pain management  Pain clinic profile    Pain clinic profile   8. Encounter for medication management  Pain clinic profile    Pain clinic profile     PLAN:  Patient Instructions   1. She requires thoracic and lumbar  MRI to assess for disc herniation, stenosis, fracture, anatomical pathology contributing to pain.  Previous conservative treatments were not effective in addressing the patient's symptoms. At least six weeks of physical therapy/chiropractic care, six weeks of reduced activities have been tried without improvement in symptoms.     2. Voltarin apply 4 times a day, She was given a prescription for this    3. PCP could consider trying tizanidine    4. She was started  on venlafaxine this could be increased for pain    5 We recommend water therapy at the [facility]    EVALUATION:    She is a 40 y.o. female with a chief complaint of:   Chief Complaint   Patient presents with    Back Pain     lower     Pain is described in the back region . She associates onset of pain with post ACL surgery 2014.    Please refer to the [facility] patient questionnaire for pain diagram.    IMAGING: Personally Reviewed and reports have been copied in to the note  Back x-ray scoliosis    PAIN:  Pain    01/23/16 1300   PainSc:   8   PainLoc: Back      During the past week VAS pain score has been; average 10/10, worst 10/10 and least 8/10.    The pain is described as sharp, dull, shooting, stabbing, lightening shock, cutting, cramping, tight. The pain is alleviated by medication, prayer and TENS unit, is aggravated by lying down, standing, sitting, walking, exercise, bending, lifting, twisting, stairs, changes in weather, use of arms, use of legs, distracting activities and recreation and occurs under certain circumstances constantly (100% of the time). The  pain has increased since the beginning of this pain problem.    FUNCTIONAL LIMITATIONS:   She states the pain often interferes with anything. During the past month, she was able to walk ~ 20 minutes, sit ~ 20 minutes, and stand~ 20 minutes before pain becomes the limiting factor.     Patient working? No disability     CURRENT PAIN MEDICATIONS:  Current pain medication:  venlafaxine  Gabapentin  Remeron    PAST MEDICAL HISTORY and PAST SURGICAL HISTORY: Reviewed    SOCIAL HISTORY: Reviewed  Social History     Social History    Marital status: Single     Spouse name: N/A    Number of children: N/A    Years of education: N/A     Occupational History    Not on file.     Social History Main Topics    Smoking status: Current Every Day Smoker     Packs/day: 0.25    Smokeless tobacco: Never Used    Alcohol use No    Drug use: No      Sexual activity: Yes     Partners: Male     Birth control/ protection: Surgical     Social History Narrative     PSYCHOLOGICAL HISTORY: Reviewed  Psychological Treatments:  She  acknowledges a present or past history of psychiatric, psychological or social work evaluations or treatments for any problems including the current pain problem.  She denies present or past history of suicide ideation or attempt.    ROS:  As below otherwise 12 point review of systems negative.  CONSTITUTIONAL: Appetite good, No fevers, night sweats, or weight loss. Recent infections no. Difficulty sleeping due to pain negative.  EYES: No visual changes, No eye pain.  ENT: No hearing difficulties, No ear pain.  CV: No chest pain, Shortness of breath or peripheral edema.  RESPIRATORY: No cough, wheezing or dyspnea.  Sleep apnea no.  GI: no nausea/vomiting, abdominal pain, change in bowel habits, GERD positive for ulcer .   GU: No dysuria, urgency, or incontinence.    MS: Pain as described.  SKIN: No rashes.   NEURO: No MS changes,  motor weakness negative,  sensory changes negative.   PSYCH: Depression negative, Anxiety negative.   ENDOCRINE: No polyuria/polydipsia or heat intolerance. DM negative.  HEME/LYMPH: no easy bleeding/bruising or swollen nodes. Blood thinners no.   ALL/IMMUN: allergic reactions.    PHYSICAL EXAM:    Vitals:   Visit Vitals    BP 127/74    Pulse 104    Resp 18    Ht 1.727 m (5' 8" )    Wt 93.4 kg (206 lb)    LMP 01/18/2016    BMI 31.32 kg/m2     PSYCHIATRIC:  Mood, behavior, and interactions are appropriate. Affect is normal.    NEURO:   Cognition is not slowed.  Speech is Normal rate and tone.  Pupils are normal.    Motor:  Tone is normal. No evidence of atrophy.    Strength: 5/5 unless documented otherwise    SPINE:  General Spine-  She rises from a seated position without the use of her arms.    Range of motion on flexion-extension as well as lateral rotation: Cervical full and Lumbar full:         Reproducible myofascial tenderness in the paraspinal muscles:  positive    GENERAL:  SKIN: no rashes or bruises, lesions. hyperesthesia negative. allodynia negative. Healed scars. Temperature differences in the  upper or lower extremities negative.  Extremity hair loss negative.    HEENT: sinuses non-tender, sclerae anicteric, mouth moist,   NECK: no JVD, as above  LUNGS: equal expansion, no increased work of breathing.  CHEST/BREASTS: symmetric, no masses  HEART: PMI Regular rate and rhythm  ABDOMEN/GI: ND, soft, non-tender, no masses, no hepatosplenomegaly  EXTREMITIES: no LE edema, calves soft  VASCULAR: 2+ Radial/ulnar, femoral, popliteal, DP PT pulses. No rubor, pallor, varicose veins, edema, hemosiderin staining.    Opioid Risk Assessment:    POCT/Urine Screen:  Urine screen was sent for compliance assessment.  Recent Results (from the past 336 hour(s))   Pain clinic profile    Collection Time: 01/23/16  2:00 PM   Result Value Ref Range    Amphetamine,UR NEG     Cocaine/Metab,UR NEG     Opiates,UR NEG     Oxycodone/Oxymorphone,UR NEG     THC Metabolite,UR NEG     Benzodiazepinen,UR NEG       Opioid Risk Assessment:        01/23/2016    Search results reveiwed    PLAN:  The current regime is not modestly effective in palliating pain.  We have dicussed our expectation regarding opioid use.  The goal is > 30% reduction in pain, improved mood, increased function, and minimization of side effects.  We take in to consideration risk versus benefit.  We evaluate pain types, structural components and modulating factors.  Pain treatment plan may alter based on this information    We have reviewed the expectations of our pain clinic. Pain medications or injections may provide diagnostic value, may enhance or expedite recovery, may provide durable relief over time, and may ultimately improve pain, mood, sleep, and function.  We dissuade use of opioids or repeated pain injections if this criteria has not been met.  It is  expected that patients will actively participate in rehabilitation to avoid long term disability. We have discussed that greater than 3-4 steroid injections a year should be avoided to reduce steroid induced complications. This includes injections performed by our center and other centers.    Patient is aware that we audit for random urine screens, pill counts, i-stop and compliance with care. Urine was obtained for compliance today.    Recommendations and approval requests are listed at at the beginning of this note:    Greater than 50% of the 30 minutes with the patient was spent in counsel and education. She was counseled/educated regarding diagnostic results, impressions, and/or recommended diagnostic studies reviewed, counseled on long term prognosis and strategies for managing chronic pain, risks and benefits of recommended procedural interventions and instructions for management and/or follow-up. Specifics are identified in patient instructions and are pertinent to my note visit purpose.  We reviewed possible etiology/ pathology for pain uitlizing clinical finding, intranet images, spine model, and diagnostic tests. She was given options to palliate pain specific to her potential pain source and type. The patient acknowledged the plan of care.    Disability Notice:   The [facility] in the Department of Neurosurgery at the [facility] does not perform Functional Capacity evaluations.  In addition to objective findings, we rely strongly on patients subjective reports of pain to provide treatment.      Thank you for the opportunity to participate in this patients care.    Cammy Copa, MSN, FNP-BC

## 2016-01-26 ENCOUNTER — Ambulatory Visit: Payer: Self-pay | Admitting: Primary Care

## 2016-01-29 ENCOUNTER — Ambulatory Visit
Admission: RE | Admit: 2016-01-29 | Discharge: 2016-01-29 | Disposition: A | Discharge status: Home / Self Care | Payer: Self-pay | Source: Ambulatory Visit | Attending: General Practice | Admitting: General Practice

## 2016-01-29 LAB — LIPID PANEL
Chol/HDL Ratio: 3.2
Cholesterol: 184 mg/dL
HDL: 58 mg/dL
LDL Calculated: 110 mg/dL
Non HDL Cholesterol: 126 mg/dL
Triglycerides: 79 mg/dL

## 2016-01-30 LAB — HEPATITIS B PROF
HBV Core Ab: NEGATIVE
HBV S Ab Quant: 0.89 m[IU]/mL
HBV S Ab: NEGATIVE
HBV S Ag: NEGATIVE

## 2016-01-30 LAB — MUMPS ANTIBODY, IGG: Mumps IgG: POSITIVE

## 2016-01-30 LAB — MEASLES IGG AB: Measles IgG: POSITIVE

## 2016-01-30 LAB — RUBELLA ANTIBODY, IGG: Rubella IgG AB: POSITIVE

## 2016-02-02 ENCOUNTER — Telehealth: Payer: Self-pay | Admitting: Primary Care

## 2016-02-02 NOTE — Telephone Encounter (Signed)
Per Dr. Ezzie DuralLis, please call patient and schedule with Dr. Gerrit HeckKranz for fuv pain issues. Called patient, unable to leave a message.

## 2016-02-02 NOTE — Progress Notes (Signed)
Per Peyton Najjaramian at [employer] [place].  Patient's plan does not require auth through [facility].

## 2016-02-07 ENCOUNTER — Ambulatory Visit
Admission: RE | Admit: 2016-02-07 | Discharge: 2016-02-07 | Disposition: A | Discharge status: Home / Self Care | Payer: Self-pay | Source: Ambulatory Visit | Attending: General Practice | Admitting: General Practice

## 2016-02-07 ENCOUNTER — Ambulatory Visit: Payer: Self-pay | Admitting: Gastroenterology

## 2016-02-07 ENCOUNTER — Other Ambulatory Visit: Payer: Self-pay | Admitting: General Practice

## 2016-02-07 LAB — SEDIMENTATION RATE, AUTOMATED: Sedimentation Rate: 22 mm/hr — ABNORMAL HIGH (ref 0–20)

## 2016-02-07 NOTE — Telephone Encounter (Signed)
Called, unable to leave VM. Will send letter.

## 2016-02-08 LAB — ANTINUCLEAR ANTIBODY SCREEN: ANA Screen: NEGATIVE

## 2016-02-08 LAB — CYCLIC CITRULLINATED PEPTIDE: Cyclic Citrullin Peptide Ab: 0 U (ref 0–19)

## 2016-02-08 LAB — RHEUMATOID FACTOR,SCREEN: Rheumatoid Factor: <10 IU/mL

## 2016-02-16 ENCOUNTER — Ambulatory Visit: Payer: Self-pay | Admitting: Orthopedic Surgery

## 2016-02-27 ENCOUNTER — Telehealth: Payer: Self-pay | Admitting: Pain Medicine

## 2016-02-27 NOTE — Telephone Encounter (Signed)
She can see me back for FU after she has completed water therapy.  At that time we can discuss requesting again.      At this point no FU until I have documentation that she has completed 4-6 weeks water therapy

## 2016-03-05 ENCOUNTER — Ambulatory Visit: Payer: Self-pay | Admitting: Orthopedic Surgery

## 2016-03-26 ENCOUNTER — Encounter: Payer: Self-pay | Admitting: Orthopedic Surgery

## 2016-03-26 ENCOUNTER — Ambulatory Visit: Payer: Self-pay | Admitting: Gastroenterology

## 2016-03-26 ENCOUNTER — Ambulatory Visit: Payer: Self-pay | Admitting: Orthopedic Surgery

## 2016-03-26 VITALS — BP 139/77 | Ht 68.0 in | Wt 195.0 lb

## 2016-03-26 DIAGNOSIS — M9420 Chondromalacia, unspecified site: Secondary | ICD-10-CM

## 2016-03-26 NOTE — Progress Notes (Signed)
Patient seen at [facility], a dictated note will follow...

## 2016-03-26 NOTE — Progress Notes (Signed)
[name]:   Fetch, [name]  MR #:  [id number]   ACCOUNT #:  [id number] DOB:  October 13, 1976   DICTATED BY:  Arvil PersonsBrian D Dorean Daniello, MD DATE OF VISIT:  03/26/2016     CHIEF COMPLAINT:  Bilateral knee pain.    HISTORY OF PRESENT ILLNESS:  [name] is a 40 year old woman who has undergone prior right knee ACL reconstruction in the [place] area back in 2016, using BTB autograft.  She was doing reasonably well after this surgery, but unfortunately sustained a slip and fall landing directly onto the anterior aspect of both knees.  She has subsequently experienced severe pain and dysfunction, limited ability to tolerate more strenuous forms of physical activity.  She is walking with a limp.  She has utilized basic home exercises to optimize function and strength.  She has a frequent sense of grinding, catching, and clicking in both knees.  The knees feel weak and give out on her.  She takes Percocet, Lyrica or gabapentin for her current pain.     Details of past medical and surgical history, medications, allergies, social history, review of systems, family history are available per the intake questionnaire of 03/26/2016.  History of hypertension, osteoarthritis, gastroesophageal reflux disease, depression, anxiety.  She is status post ACL reconstruction, cholecystectomy, C-section.    ALLERGIES:  None.    PHYSICAL EXAMINATION:  GENERAL:  Well-appearing, in no acute distress.  Alert and oriented x3 with a pleasant mood and affect.  MUSCULOSKELETAL:  She has crepitus of the patellofemoral joint with range of motion both knees.  She is hyper mobile physiologically, which is symmetrical bilaterally.  She has patellar hypermobility.  Knees are stable to varus and valgus stress at 0 and 30 degrees.  Anterior and posterior drawer and Lachman tests are negative.  Dial test and pivot shift tests are negative.  Calves are soft and nontender.  She has a firm endpoint to her graft on the right.  She has pain with circumduction maneuvers  bilaterally.  Tenderness over the medial and lateral joint lines bilaterally.    IMAGING:  X-rays reveal sequela of prior ACL reconstruction with an interference screw in the tibia, a very vertical femoral tunnel with a soft tissue button, possibly within the intra-articular space.    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  1. I would like [name] to undergo an MRI of both knees, one to evaluate the integrity of her prior ACL reconstruction, as well as to evaluate for unstable chondral flaps of the patellofemoral space, and to determine whether the menisci are intact.  2. Following MRI, we will discuss options for further treatment.    3. She will continue activities to tolerance in the meantime.             ______________________________  Arvil PersonsBrian D Ahijah Devery, MD    BDG/MODL  DD:  03/26/2016 12:06:43  DT:  03/26/2016 12:25:01  Job #:  [id number]    cc:

## 2016-04-22 ENCOUNTER — Ambulatory Visit: Payer: Self-pay | Admitting: Rehabilitative and Restorative Service Providers"

## 2016-04-22 DIAGNOSIS — M549 Dorsalgia, unspecified: Secondary | ICD-10-CM

## 2016-04-22 DIAGNOSIS — M25569 Pain in unspecified knee: Secondary | ICD-10-CM

## 2016-04-22 NOTE — Patient Instructions (Signed)
POOL PASS:  [facility] Location Only  Aquatic Pool Pass for [facility]  Valid ONLY Tues and Thursday, between 8:00-10:00      Patient's Name:       [name] BrinkDavis, Fatumata                                              Date:   04/22/16                                  Pool Instructions:  Aquatic therapy is held at [facility],  [address], [place], [place] [id number] (across from [facility])    Expectations:  We are [facility] the [facility].  It is an expectation of all of our patients to comply with the rules and regulations of the [facility] at all times.  It is also an expectation that every patient stop at the front desk to check in/show pass.  We expect our patients to be polite to the front desk staff.  Failure to comply with the [facility] regulations, or if the patient fails to treat [facility] staff politely, they may be denied entrance to the building and discharged from aquatic PT.    Pool sessions are on Tuesdays and Thursdays from 8:00-10:00.  Your appointments will be scheduled within this time.    For Cancellations and Rescheduling:  Call [phone].  Dont call the [facility].    Arrive before at your scheduled time.  Go directly to the locker room and change.  Be at pool side at your scheduled time.    Cancellation and No Show Policy:    If you have to cancel your appointment, you may reschedule.   If you no-show to an appointment, you will NOT be seen if you do not have an appointment.     If you cancel or no show 2 or more appointments, [facility] reserves the right to discharge you from further therapy.    Prescription:  Your current prescription for Physical Therapy will expire 30 days after the doctor wrote it.  You will be responsible for obtaining a new prescription from your doctor at that time.  Without a prescription, you cannot participate in PT.  Call your doctors office and have the prescription faxed to [facility] at  [phone].    Parking:  You can park in the [facility] [address] garage or at any of  the metered spots in front of the [facility].  Your appointment will last 30 minutes, but plan for longer since you will need time to change.  If you park in the [facility] [address] garage, you can have your parking ticket validated in the [facility] for discounted parking.    Do not get into the pool until a therapist has arrived, unless you are a member of the [facility].    A bathing suit is required per the [facility] policy.  This is strictly enforced.  (You might be allowed to wear a t-shirt over your suit, but this is at the discretion of the supervising lifeguard.)    Bring your own lock and towel.  You must take your belongings home each day unless you pay for a locker rental.    Your hair may get wet when exercising in the pool.  You may use  a bathing /swim cap if you wish.    The pool temperature is about 84 degrees.  You will only need to get in chest deep to perform most exercises.    Childcare is not provided for aquatic therapy. Please do not bring children to your aquatic therapy session.    Your status will be re-evaluated by the Physical Therapist to determine your progress and the next appropriate transition once the aquatic program has been established.

## 2016-04-22 NOTE — Progress Notes (Signed)
Physical Therapy Daily Flowsheet:  *Please see Physical Therapy Exercise Flowsheet for details regarding exercises completed this session.*     04/22/16 1300   Overview   Diagnosis bilateral knee/back pain   Insurance Medicaid   Script Date 04/11/16   Visit # 1   Functional Outcome Measures   Self Reported Functional Measures Yes   Low Back Pain and Disability Index (Revised Oswestry) Yes   Pain Intensity 4   Personal Care 3   Lifting 5   Walking 5   Sitting 4   Standing 4   Sleeping 4   Social Life 3   Driving/Riding in car, etc 4   Changing degree of pain 5   Low back pain and disability index score 82   Patient Education   Patient Education Yes   Additional Patient Education aquatic PT   Time Calculation   PT Timed Codes 0   PT Untimed Codes 30   PT Total Treatment 30   Plan and Onset date   Plan of Care Date 04/22/16   Onset Date 11/17/14   Treatment Start Date 04/22/16   Charges   CC Charges PT Eval Low complexity - code 7161   Erich MontaneKimberly Nicandro Perrault, PT

## 2016-04-22 NOTE — Progress Notes (Signed)
Department of Physical Medicine & Rehabilitation  Physical Therapy Initial Assessment    History/Subjective    Diagnosis:back/knee pain    Referring practitioner: Song    Onset date of symptoms:  11/17/14    Mechanism of injury: fall    Work Status:  Not working    Symptoms Worsen With: walking, stairs, sit to stand    Symptoms Better With: rest      Pain:   Today: 8/10   Best: 6/10   Worst: 9/10      Location: bilateral knees, back    Past Medical History:   Diagnosis Date    Anemia     Arthritis     Depression     DJD (degenerative joint disease), lumbar     small disc disease    DUB (dysfunctional uterine bleeding)     GERD (gastroesophageal reflux disease)     Hypertension     Peptic ulcer     Sciatica of right side      Past Surgical History:   Procedure Laterality Date    ANTERIOR CRUCIATE LIGAMENT REPAIR Right 01/24/2015    CESAREAN SECTION, LOW TRANSVERSE  2007    CHOLECYSTECTOMY  1996       Comorbidities affecting treatment/recovery:   Depression   Osteoarthritis (OA)  Personal factors affecting treatment/recovery:   Unemployed    Objective    Observation: pleasant female, NAD.      Palpation: mm guarding in lumbar spine    Sensation: intact    Gait: able to ascend and descend 4 stairs with railing    ROM Lumbar Spine % deficit   Flexion 50   Extension 50   Right Sidebending 50   Left Sidebending 50     Knee ROM:  L:  10-105                       R:  10-115    Strength:  Left LE: 4/5  Right LE: 4/5     Oswestry score:  82%    Assessment:  40 y.o. y/o female  who presents to physical therapy with  pain due to   1. Back pain      11/17/14   2. Knee pain     .  Pt will benefit from aquatic PT to increase functional mobility and decrease pain as buoyancy of water will alleviate joint pressures and facilitate active exercise.      Rehab potential/prognosis: good  Patient's understanding: good    Clinical presentation:   stable    Patient complexity:     low level as indicated by above stability of  condition, personal factors, environmental factors and comorbidities in addition to their impairments found on physical exam.  Plan  Plan of Care: Appropriate for PT    PT interventions: Aquatic therapy    PT frequency:  Once a week, Twice a week    PT duration: 4 weeks      Short Term Goals: (2 weeks)  2. Initiate aquatic exercise program without an increase in the patient's pain.  3. Patient will report a decrease in pain by 3 points by 2nd pool visit.  4. Patient able to tolerate >/= 10 minutes aquatic exercise activity.    Long Terms Goals: (4 weeks)   3. Independent with aquatic exercise program.  4. Patient will be able to walk for 15 minutes continuously without pain for one week.  5. Patient able to tolerate >= 20 minutes  continuous aquatic exercise activity.  6. Patient will have improved Oswestry to 70%      Pt Goal:  Manage pain better, get some relief    Thank you for the referral.  If you have any questions and/or concerns, please feel free to contact me at [phone].      [name], PT

## 2016-04-26 ENCOUNTER — Other Ambulatory Visit
Admission: RE | Admit: 2016-04-26 | Discharge: 2016-04-26 | Disposition: A | Discharge status: Home / Self Care | Payer: Self-pay | Source: Ambulatory Visit | Attending: Psychiatry | Admitting: Psychiatry

## 2016-04-26 LAB — COMPREHENSIVE METABOLIC PANEL
ALT: 15 U/L (ref 0–35)
AST: 18 U/L (ref 0–35)
Albumin: 4.3 g/dL (ref 3.5–5.2)
Alk Phos: 60 U/L (ref 35–105)
Anion Gap: 14 (ref 7–16)
Bilirubin,Total: 0.2 mg/dL (ref 0.0–1.2)
CO2: 25 mmol/L (ref 20–28)
Calcium: 9.2 mg/dL (ref 8.8–10.2)
Chloride: 103 mmol/L (ref 96–108)
Creatinine: 0.79 mg/dL (ref 0.51–0.95)
GFR,Black: 108 *
GFR,Caucasian: 93 *
Glucose: 95 mg/dL (ref 60–99)
Lab: 11 mg/dL (ref 6–20)
Potassium: 3.7 mmol/L (ref 3.3–5.1)
Sodium: 142 mmol/L (ref 133–145)
Total Protein: 7.3 g/dL (ref 6.3–7.7)

## 2016-04-26 LAB — CBC AND DIFFERENTIAL
Baso # K/uL: 0 10*3/uL (ref 0.0–0.1)
Basophil %: 0.8 %
Eos # K/uL: 0.1 10*3/uL (ref 0.0–0.4)
Eosinophil %: 1.4 %
Hematocrit: 35 % (ref 34–45)
Hemoglobin: 11.9 g/dL (ref 11.2–15.7)
IMM Granulocytes #: 0 10*3/uL (ref 0.0–0.1)
IMM Granulocytes: 0.2 %
Lymph # K/uL: 2.2 10*3/uL (ref 1.2–3.7)
Lymphocyte %: 45 %
MCH: 28 pg/cell (ref 26–32)
MCHC: 34 g/dL (ref 32–36)
MCV: 81 fL (ref 79–95)
Mono # K/uL: 0.3 10*3/uL (ref 0.2–0.9)
Monocyte %: 6.1 %
Neut # K/uL: 2.3 10*3/uL (ref 1.6–6.1)
Nucl RBC # K/uL: 0 10*3/uL (ref 0.0–0.0)
Nucl RBC %: 0.2 /100 WBC (ref 0.0–0.2)
Platelets: 303 10*3/uL (ref 160–370)
RBC: 4.3 MIL/uL (ref 3.9–5.2)
RDW: 15.7 % — ABNORMAL HIGH (ref 11.7–14.4)
Seg Neut %: 46.5 %
WBC: 4.9 10*3/uL (ref 4.0–10.0)

## 2016-04-26 LAB — TSH: TSH: 0.74 u[IU]/mL (ref 0.27–4.20)

## 2016-04-26 LAB — T4, FREE: Free T4: 1 ng/dL (ref 0.9–1.7)

## 2016-04-27 LAB — HEMOGLOBIN A1C: Hemoglobin A1C: 5.5 % (ref 4.0–6.0)

## 2016-04-30 ENCOUNTER — Ambulatory Visit: Payer: Self-pay | Admitting: Orthopedic Surgery

## 2016-04-30 ENCOUNTER — Encounter: Payer: Self-pay | Admitting: Orthopedic Surgery

## 2016-04-30 VITALS — BP 136/63 | HR 104 | Ht 69.0 in | Wt 195.0 lb

## 2016-04-30 DIAGNOSIS — M25551 Pain in right hip: Secondary | ICD-10-CM

## 2016-04-30 DIAGNOSIS — M25561 Pain in right knee: Secondary | ICD-10-CM

## 2016-04-30 DIAGNOSIS — M25562 Pain in left knee: Secondary | ICD-10-CM

## 2016-04-30 NOTE — Progress Notes (Signed)
[name]:   [name], [name]  MR #:  [id number]   ACCOUNT #:  [id number] DOB:  1976/01/08   DICTATED BY:  Arvil PersonsBrian D Darienne Belleau, MD DATE OF VISIT:  04/30/2016     INTERVAL HISTORY:  [name] is here today for followup and ongoing treatment of bilateral knee pain.  She continues to have pain and dysfunction of both knees, limiting ability to participate in physical activity.  Kneeling, squatting, and bending cause significant symptomatic exacerbation.  MRIs of both knees were ordered to evaluate the integrity of ACL graft in the right knee, meniscus, articular cartilage.    PHYSICAL EXAMINATION:  She has crepitus of the patellofemoral joints with range of motion of both knees.  Tenderness over the medial joint line.  She has pain with circumduction maneuvers.  Ligamentous exam remains at baseline.  Distal neurovascular exam is normal.     MRI of both knees reveals intact ACL graft, right knee, with patellofemoral chondromalacia, meniscal tearing, and hypertrophy of the infrapatellar synovial fat pad.  Symmetrical changes bilaterally.  Intact ACL, cruciate, collateral ligaments, left knee.    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     [name] will undergo bilateral intra-articular corticosteroid injection utilizing the injection as an adjunct to ongoing nonoperative treatment, based on response, effectiveness, and duration of symptomatic relief.  We discussed the option of repeat injectables versus alternative injectables, such as viscosupplementation or ortho biologics, and possibly arthroscopic debridement.  Risks of all forms of treatment were discussed.    PROCEDURE NOTE:  Bilateral knees identified as the correct procedural site.  Sterilely prepped with Betadine over the anterolateral aspect of both knee joints.  Injected with 2 cc of 6 mg/cc Celestone and 5 cc  0.25% Marcaine.  Tolerated the procedure without complications.  Band-Aid was applied.             ______________________________  Arvil PersonsBrian D Ranger Petrich, MD    BDG/MODL  DD:  04/30/2016 16:29:29  DT:  04/30/2016 18:12:40  Job #:  [id number]    cc:  Leola BrazilSoonil Song, MD   [address]   [place], [place] [id number]

## 2016-04-30 NOTE — Progress Notes (Signed)
Patient seen at [facility], a dictated note will follow...

## 2016-05-17 ENCOUNTER — Ambulatory Visit: Payer: Self-pay | Admitting: Orthopedic Surgery

## 2016-05-28 ENCOUNTER — Ambulatory Visit: Payer: Self-pay

## 2016-05-28 ENCOUNTER — Telehealth: Payer: Self-pay

## 2016-05-28 NOTE — Telephone Encounter (Signed)
Patient called stating her transportation did not arrive today, so she missed her 9:30am PFT.    Please call her to reschedule.

## 2016-05-28 NOTE — Progress Notes (Signed)
Physical Therapy Daily Flowsheet:  *Please see Physical Therapy Exercise Flowsheet for details regarding exercises completed this session.*     05/28/16 1000   Overview   Diagnosis bilateral knee/ back pain   Insurance Medicaid   Script Date 05/30/16   Missed Visit Canceled   Additional Comments Transportation did not show up.   Charges   No Charge Code No Charge Code   Elpidio AnisShannon Ellamae Lybeck, PT, DPT  Pager # [phone]

## 2016-05-30 ENCOUNTER — Ambulatory Visit: Payer: Self-pay

## 2016-05-30 DIAGNOSIS — M549 Dorsalgia, unspecified: Secondary | ICD-10-CM

## 2016-05-30 NOTE — Progress Notes (Signed)
Department of Physical Medicine and Rehabilitation  Physical Therapy Aquatic Session                   05/30/16 0800   Aquatic Therapy Exercises   Aquatic Therapy Exercises Yes   Hip Abduction Comment B x 10   Hip Extension Comment B x 10   Hip Flexion Comment B x 10   Marches Comment B x 10   Minisquats Comment B x 10   Push/Pulls, Paddles Comment B x 10   Shoulder Abduction, Paddles Comment B x 10   Shoulder Flexion, Paddles Comment B x 10   Shoulder Horizontal Adduction/Abduction, Paddles Comment B x 10   Spinal Stabilizations Comment VCs t/o for posture.    Water Walking Comment x 4 laps w/o res   Calf Raises Comment B x 10   Sidestepping Comment x 4 laps   Additional Exercises Hamstring stretch w/ noodle x 30 sec/2 reps   additional exercise Tolerated well.    Total time 27 min     Reita Clicheracy Melbert Botelho, [place]  Pager [phone]

## 2016-05-30 NOTE — Progress Notes (Signed)
Department of Physical Medicine & Rehabilitation  Physical Therapy Progress Note    History:  Diagnosis: bilateral knee/ back pain  Frequency: Twice a week  Attendance: Consistent, initial aquatic therapy session. First scheduled session was cancelled due to transportation issues  Treatment: Aquatic therapy    Subjective:  Pain: Unchanged    0-10 Scale: 8     Pain Location: Back             Objective:  The patient reports she can walk for 15 minutes, stand for 15 minutes, and can sit for 15 minutes. She reports she rely on her UE's for completing sit to stands.        Functional outcome measures:      Low back pain and disability index score: 74%           Patient Education  Additional Patient Education: Pt educated on the no show/cancellation policy     Assessment:  Pt presents to aquatic therapy today with reports of back pain with symptoms that are impacting her functional activity tolerance and quality of life. She reports impairments impaired strength and endurance causing increased pain. These symptoms are causing functional limitations for walking, standing, sitting, and mobility on the stairs. She is appropriate for aquatic therapy to address her impairments and functional limitations in a gravity reduced environment promoting decreased pain, improved ROM, and improved strength in order to improve her overall functional mobility and activity tolerance on land.    Progress toward previous goals: good   Patients compliance with therapy and home exercise program:  good      Plan:  Continue aquatic therapy  Short Term Goals: (2 weeks) Ongoing and appropriate  1. Initiate aquatic exercise program without an increase in the patient's pain.  2. Patient will report a decrease in pain by 3 points by 2nd pool visit.  3. Patient able to tolerate >/= 10 minutes aquatic exercise activity.    Long Terms Goals: (4 weeks) Ongoing and appropriate    1. Independent with aquatic exercise program.  2. Patient will be able to  walk for 15 minutes continuously without pain for one week.  3. Patient able to tolerate >= 20 minutes continuous aquatic exercise activity.  4. Patient will have improved Oswestry to 70%    Plan of Care:  Continue current PT program, progressing as tolerated    Thank you for the referral.  If you have any questions and/or concerns, please feel free to contact me at [phone].  Elpidio AnisShannon Muzamil Harker, PT

## 2016-05-30 NOTE — Progress Notes (Signed)
Physical Therapy Daily Flowsheet:  *Please see Physical Therapy Exercise Flowsheet for details regarding exercises completed this session.*       05/30/16 0800   Overview   Diagnosis bilateral knee/ back pain   Insurance Medicaid   Script Date 05/30/16   Visit # 2   Additional Comments Aquatic Therapy   Pain Assessment   Pain X   0-10 Scale 8   Pain Location Back   Functional Task   Functional Tasks Yes   Additional Comments The patient reports she can walk for 15 minutes, stand for 15 minutes, and can sit for 15 minutes. She reports she rely on her UE's for completing sit to stands.   Functional Outcome Measures   Low Back Pain and Disability Index (Revised Oswestry) Yes   Pain Intensity 5   Personal Care 3   Lifting 4   Walking 2   Sitting 3   Standing 4   Sleeping 4   Social Life 4   Driving/Riding in car, etc 3   Changing degree of pain 5   Low back pain and disability index score 74   Patient Education   Additional Patient Education Pt educated on the no show/cancellation policy   Time Calculation   PT Timed Codes 27   PT Untimed Codes 0   PT Unbilled Time 0   PT Total Treatment 27   Plan and Onset date   Plan of Care Date 04/22/16   Onset Date 11/17/14   Treatment Start Date 04/22/16   Charges   [facility] Charges Aquatic Therapy - code [id number] (15 minutes x2)   Elpidio AnisShannon Ravinder Hofland, PT, DPT  Pager # [phone]

## 2016-06-04 ENCOUNTER — Ambulatory Visit: Payer: Self-pay

## 2016-06-04 DIAGNOSIS — M549 Dorsalgia, unspecified: Secondary | ICD-10-CM

## 2016-06-04 NOTE — Progress Notes (Signed)
Department of Physical Medicine and Rehabilitation  Physical Therapy Aquatic Session                   06/04/16 0800   Aquatic Therapy Exercises   Aquatic Therapy Exercises Yes   Hip Abduction Comment B x 20   Hip Extension Comment B x 20   Hip Flexion Comment B x 20   Marches Comment B x 20   Minisquats Comment B x 20   Push/Pulls, Paddles Comment B x 20   Shoulder Abduction, Paddles Comment B x 20   Shoulder Flexion, Paddles Comment B x 20   Shoulder Horizontal Adduction/Abduction, Paddles Comment B x 20B x 20   Spinal Stabilizations Comment Minimal cues - carryover for 50% of exercises.   Water Walking Comment x 4 laps w/ res   Calf Raises Comment B x 20   Sidestepping Comment x 4 laps   Additional Exercises B x 20   additional exercise B x 20   Total time 29 min.     [name], PTA  Pager [phone]

## 2016-06-04 NOTE — Progress Notes (Signed)
Physical Therapy Daily Flowsheet:  *Please see Physical Therapy Exercise Flowsheet for details regarding exercises completed this session.*     06/04/16 0800   Overview   Diagnosis bilateral knee/ back pain   Insurance Medicaid   Script Date 05/30/16   Visit # 3   Additional Comments Aquatic Therapy   Pain Assessment   Pain X   0-10 Scale 8   Pain Location Back   Time Calculation   PT Timed Codes 29   PT Untimed Codes 0   PT Unbilled Time 0   PT Total Treatment 29   Plan and Onset date   Plan of Care Date 04/22/16   Onset Date 11/17/14   Treatment Start Date 04/22/16   Charges   [facility] Charges Aquatic Therapy - code [id number] (15 minutes x2)     Reita Clicheracy Su Duma, PTA  Pager [phone]

## 2016-06-06 ENCOUNTER — Ambulatory Visit: Payer: Self-pay

## 2016-06-06 DIAGNOSIS — M549 Dorsalgia, unspecified: Secondary | ICD-10-CM

## 2016-06-06 NOTE — Progress Notes (Signed)
Physical Therapy Daily Flowsheet:  *Please see Physical Therapy Exercise Flowsheet for details regarding exercises completed this session.*     06/06/16 0900   Overview   Diagnosis bilateral knee/ back pain   Insurance Medicaid   Script Date 05/30/16   Visit # 4   Additional Comments Aquatic Therapy   Pain Assessment   Pain X   0-10 Scale 8   Pain Location Back   Time Calculation   PT Timed Codes 30   PT Untimed Codes 0   PT Unbilled Time 0   PT Total Treatment 30   Plan and Onset date   Plan of Care Date 04/22/16   Onset Date 11/17/14   Treatment Start Date 04/22/16   Charges   [facility] Charges Aquatic Therapy - code [id number] (15 minutes x2)   Reita Clicheracy Lafe Clerk, PTA  Pager [phone]

## 2016-06-06 NOTE — Progress Notes (Signed)
Department of Physical Medicine and Rehabilitation  Physical Therapy Aquatic Session                   06/06/16 0900   Aquatic Therapy Exercises   Aquatic Therapy Exercises Yes   Hip Abduction Comment B x 30   Hip Extension Comment B x 30   Hip Flexion Comment B x 30   Marches Comment B x 30   Minisquats Comment B x 30   Push/Pulls, Paddles Comment B x 30   Shoulder Abduction, Paddles Comment B x 30   Shoulder Flexion, Paddles Comment B x 30   Shoulder Horizontal Adduction/Abduction, Paddles Comment B x 30   Spinal Stabilizations Comment Min. Cues, increased carryover for TE.   Water Walking Comment x 4 laps w/ res   Calf Raises Comment B x 30   Sidestepping Comment x 4 laps   Additional Exercises Hamstring stretch w/ noodle - 2 x 30 sec.   additional exercise Step ups x 15 w/ blue board for RLE strengthening (buckling.)   additional exercise Tolerated well   Total time 30 min.     Reita Clicheracy Teresea Donley, PTA  Pager [phone]

## 2016-06-11 ENCOUNTER — Ambulatory Visit: Payer: Self-pay | Admitting: Rehabilitative and Restorative Service Providers"

## 2016-06-11 DIAGNOSIS — M549 Dorsalgia, unspecified: Secondary | ICD-10-CM

## 2016-06-11 NOTE — Progress Notes (Signed)
.  Physical Therapy Daily Flowsheet:  *Please see Physical Therapy Exercise Flowsheet for details regarding exercises completed this session.*     06/11/16 1000   Overview   Diagnosis bilateral knee/ back pain   Insurance Medicaid   Script Date 05/30/16   Visit # 5   Additional Comments Aquatic Therapy   Pain Assessment   Pain X   0-10 Scale 8   Pain Location Back   Functional Outcome Measures   Self Reported Functional Measures Yes   Low Back Pain and Disability Index (Revised Oswestry) Yes   Pain Intensity 4   Personal Care 3   Lifting 3   Walking 2   Sitting 3   Standing 3   Sleeping 3   Social Life 3   Driving/Riding in car, etc 3   Changing degree of pain 4   Low back pain and disability index score 62   Patient Education   Patient Education Yes   Educated in home exercise program Yes   Time Calculation   PT Timed Codes 32   PT Untimed Codes 0   PT Unbilled Time 0   PT Total Treatment 32   Plan and Onset date   Plan of Care Date 04/22/16   Onset Date 11/17/14   Treatment Start Date 04/22/16   Charges   [facility] Charges Aquatic Therapy - code [id number] (15 minutes x2)   Elana Almeborah Brandin Stetzer, PT

## 2016-06-11 NOTE — Progress Notes (Signed)
Department of Physical Medicine and Rehabilitation  Physical Therapy Aquatic Session                   06/11/16 1000   Aquatic Therapy Exercises   Aquatic Therapy Exercises Yes   Hip Abduction Comment BX35   Hip Extension Comment BX35   Hip Flexion Comment BX35   Marches Comment BX35   Minisquats Comment BX35   Push/Pulls, Paddles Comment BX35   Shoulder Abduction, Paddles Comment BX35   Shoulder Flexion, Paddles Comment BX35   Shoulder Horizontal Adduction/Abduction, Paddles Comment BX35   Water Walking Comment x 5 laps resisted   Calf Raises Comment BX35   Sidestepping Comment x 5 laps   Additional Exercises Hamstring stretch w/ noodle - 2 x 30 sec.   additional exercise Step ups x 15 w/ blue board for RLE strengthening (buckling.)   additional exercise Tolerated well   Total time 32min   Elana Almeborah Enna Warwick, PT

## 2016-06-11 NOTE — Progress Notes (Signed)
Department of Physical Medicine & Rehabilitation  Physical Therapy Progress Note    History:  Diagnosis: bilateral knee/ back pain  Frequency: Twice a week  Attendance: Consistent  Treatment: Aquatic therapy, AROM/PROM/Therapeutic exercise    Subjective:  Pain: Unchanged    0-10 Scale: 8     Pain Location: Back             Objective:  ROM:    Strength:    Function:   1. Unchanged  2. Mobility:  Ambulates without assistive device    Functional outcome measures:   Self-Reported :     Low back pain and disability index score: 62        Patient Education  Patient Education: Yes  Educated in home exercise program: Yes     Assessment:  40 y.o. y/o female with   1. Back pain, unspecified back location, unspecified back pain laterality, unspecified chronicity      who has completed 4 aquatic PT visits and is now independent with the aquatic exercise program.  The patient can continue  her program on her own to maximize functional outcomes and continue progressing strength and endurance. Patient is independent with aquatic PT exercise program and has been [facility] to continue on her [facility]. The patient has been educated on the importance of compliance with aquatic exercise. The patient was also educated on obtaining a new script for land based physical therapy after one month of independent aquatic exercise. Discontinue PT at this time.    Progress toward previous goals: fair   Patients compliance with therapy and home exercise program:  good      Plan:  Short Term Goals:  Partially achieved  Long Term goals:  achieved  Plan of Care:  Discharge PT, all goals achieved and patient independent with a home exercise program    Thank you for the referral.  If you have any questions and/or concerns, please feel free to contact me at [phone].  Elana Almeborah Dorothymae Maciver, PT

## 2016-06-21 ENCOUNTER — Encounter: Payer: Self-pay | Admitting: Orthopedic Surgery

## 2016-06-21 ENCOUNTER — Telehealth: Payer: Self-pay | Admitting: Gastroenterology

## 2016-06-21 ENCOUNTER — Ambulatory Visit: Payer: Self-pay | Admitting: Orthopedic Surgery

## 2016-06-21 VITALS — BP 141/94 | Ht 69.0 in | Wt 191.0 lb

## 2016-06-21 DIAGNOSIS — M9420 Chondromalacia, unspecified site: Secondary | ICD-10-CM

## 2016-06-21 NOTE — Progress Notes (Signed)
[name]:   [name], [name]  MR #:  [id number]   ACCOUNT #:  [id number] DOB:  [birth year]   DICTATED BY:  Arvil PersonsBrian D Ihan Pat, MD DATE OF VISIT:  06/21/2016     FOLLOWUP:      INTERVAL HISTORY:  [name] is here today for followup and ongoing treatment of her of bilateral knee pain.  At last visit, she underwent bilateral intra-articular corticosteroid injections, which gave excellent symptomatic relief.  Pain relief has been short-lived and pain has returned.  Previous imaging revealed on meniscal and chondral pathology.  She is interested at this time in pursuing arthroscopic intervention.    PHYSICAL EXAMINATION:  She continues to have crepitus of the patellofemoral joint with range of motion.  She has pain with circumduction maneuvers.  Mild effusions of both knees.  Ligamentous exam remains at baseline.  Calf is soft, nontender.  She can extend both knees against resistance.    ASSESSMENT/PLAN:  A 40 year old woman with ongoing bilateral knee pain secondary to progressive meniscal, chondral, and synovial pathology.     [name] was offered the option of right knee arthroscopy with meniscectomy, chondroplasty, and synovectomy.  Risks, benefits, alternatives, and reasonable expectations of surgery were discussed.  Prognosis, rate of rehabilitation and recovery were reviewed.  Questions were invited and answered to her satisfaction.  She will weigh her options and set up surgery at a time of her convenience.             ______________________________  Arvil PersonsBrian D Shaelynn Dragos, MD    BDG/MODL  DD:  06/21/2016 11:03:30  DT:  06/21/2016 11:13:08  Job #:  [id number]    cc:  Leola BrazilSoonil Song, MD   [address]   [place], [place] [id number]

## 2016-06-21 NOTE — Progress Notes (Signed)
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       CPT Code:     Hip   [] Right      [] Left        [] Diagnostic Arthroscopy, 29860        [] Removal of loose bodies, 29861        [] Chondroplasty, 29862        [] Synovectomy, 29863        [] Femoroplasty, 29914        [] Acetabuloplasty, 29915        [] Labral repair, 29916        [] Trochanteric Bursectomy - open, 27062/29999        [] Gluteus Medius Repair - open, 27110/29999        [] IT Band Release, 27305        [] Abductor Tenotomy,  27001        [] Sciatic Neurolysis,  64712        [] Other:    Shoulder     [] Right      [] Left        [] Rotator Cuff Repair Arthroscopy, 29827    [] possible       [] Superior Capsular Reconstruction Arthroscopy, 29806  [] possible          [] Arthroscopic Debridement,  29823         [] Subacromial Decompression/Acromioplasty Arthroscopy, 29826         [] Distal Clavicle Excision Arthroscopy, 29824         [] Arthroscopic Bankart Repair, 29806   [] Open Bankart Repair, 23455          [] Labral/SLAP Repair Arthroscopy, 29807         [] Synovectomy Arthroscopy, (full: 29821 partial:29822),               [] Bicep Tenodesis/Tenotomy, 23430    [] possible           [] Total shoulder athroplasty, 23472   [] Reverse athroplasty, 23472   [] Latarjet, Open, 23462        [] Other:       Knee     [x] Right      [] Left        [] Knee Arthroscopy, 29870        [] A'scopic menisectomy vs. repair, 29881        [x] A'scopic chondroplasty, 29877        [] ACL reconstruction with Autograft Arthroscopy, 29888    [] Bone-Tendon-Bone [] Hamstring         [] ACL reconstruction with Allograft Arthroscopy, 29888        [] Synovectomy Arthroscopy, (full: 29876 partial:29875)        [] Lateral Release Arthroscopy, 29873        [] Patella-Femoral Realignment, Open, 27422        [] Loose Body removal Arthroscopy, 29874        [] Patella repair, 27380        [] Quad repair,  27385        [] Other:        Elbow    []  Right      [] Left        [] Arthroscopic Debridement, 29837        [] Arthroscopic Extensive Debridement,   29838        [] Loose Body Removal Arthroscopy, 29838          [] Ulnar nerve transpose/decompression, 64718        [] Distal bicep repair, 24342        [] Elbow arthroplasty, 24363        [] Open capsular release, 24006        [] Debridement of the Common Extensor, Open, 24359         [] Other:    Anesthesia: [x] Choice [] General [] MAC [] Brachial Plexus Block    [] IA  [] Pre op fem Block  [] POST op fem Block    [] Regional    Position: [] Beach chair/Spider  [] Lateral Decubitus [x] Supine    Admit Type:  [x] ASC  [] Inpatient/SDA [] 23hr      Length of Procedure:  [x] 30 min [] 1 hr  [] 1.5 hr  []2 hr    [] other:    Labs:    [] CBC, SMA 12, PT/PTT, ESR, CRP, UA w/C+S, Type & Screen   [] EKG   [] Chest XR    Medical Clearance:   [] PCP  [] Cardiology [] Rheum [] Other:   [] Anesthesia consult    Special Equipment:   [] C-arm [] Skytron table/Nicolson's headrest  [] CPM   [] Hand table   [] Other:   [] Arthrex:   [] Zimmer:    Insurance:  [] WC  [] MVA    Biologics:        [] Angel PRP        [] Stem Cell (MSC)    Therapy Preferences:  [x] Sports PT   [] Hand Therapy [] Physical [facility]   [] [place]   [] [place]   [] [place]   [] [place]    [] [place]   [] [place]   [] [place]      [] HH  [x] [facility]    COMMENTS:

## 2016-06-21 NOTE — Telephone Encounter (Signed)
Patient calling to re-schedule canceled appointment and coloscopy. Patient states she was told she was sceduled for a coloscopy but can not remember time and date. [name] is requesting a call back at [phone]

## 2016-06-25 ENCOUNTER — Encounter: Payer: Self-pay | Admitting: Orthopedic Surgery

## 2016-06-25 NOTE — Telephone Encounter (Signed)
Returned call to patient. She was referred for Peptic Ulcer, needs NPV, no answer, left VM for the patient to return call.

## 2016-06-25 NOTE — Anesthesia Preprocedure Evaluation (Addendum)
Anesthesia Pre-operative History and Physical for [name] BrinkAngela [name]    ______________________________________________________________________________________    Summary:  I have reviewed the RN anesthesia screening information and erecords for [facility]. The patient is scheduled for KNEE ARTHROSCOPY WITH CHONDROPLASTY - Right knee chondroplasty 30 minute case (Right Knee) on 06/26/16 with Dr. Lossie FaesBrian Giordano. The patient was flagged for review by the Anesthesia screening nurses based on the screening criteria of:    The patient's medical history is significant for:  - Pt was [facility] July [facility] for fainting. Denies CP or SOB. Holter placed and was negative (results in media). She did have several weeks of diarrhea prior that contributed to her dehydration. Did not see cardiology. No s/s since.   - Chronic pain - DJD (degenerative joint disease), lumbar / Arthritis - knee/ Fibromyalgia - [facility] pain clinic, Mobic, percocet daily, Gabapentin at hs and Lyrica. Climbing 1 FOS painful on knees and needs to rest , denies Chest pain or SOB. Completed aqua therapy and can walk to corner store on occasion, no other regular exercise routine.   - Anemia- po iron - H/H stable  - Hypertension - Amlodipine  - +Snores loudly, believes apneic episodes- never been tested    - Depression / Anxiety - Cymbalta, Remeron, Hydroxyzine   - Tobacco abuse     - GERD (gastroesophageal reflux disease) / Peptic ulcer - Omeprazole    - BMI 28.3    According to the anesthesia RN screening report the patient has no present complaints of difficulty breathing. No additional cardiopulmonary testing or in person anesthesia evaluation is indicated for this procedure.    Spoke with Dr. Marcene DuosPyne - plan to proceed at [facility] as scheduled.    This patient is deemed an appropriate candidate for the proposed surgery in an ambulatory setting.  By Corene CorneaHRISTINE NORTHRUP, NP at 9:16 AM on 06/25/2016    <URMCANSURGSITE>  Anesthesia  Evaluation Information Source: patient, records              PULMONARY    + Smoker    + Snoring    + Sleep apnea (possible, no testing )    CARDIOVASCULAR    + Hypertension    + Cardiac Testing (holter monitoring negative)    GI/HEPATIC/RENAL  Last PO Intake: Enter Last PO Intake in ROS/Med Hx Tab    + GERD NEURO/PSYCH    + Headaches            migraines    + Chronic pain          fibromyalgia, lower back, opioid tolerant, sees pain specialist    + Psychiatric Issues          anxiety, depression        HEMALOGIC    + Blood dyscrasia            anemia    + Arthritis            knees and lumbar       Physical Exam    Airway            Mouth opening: normal            Mallampati: II            TM distance (fb): >3 FB            TM distance (cm): 3            Neck ROM: full  Cardiovascular  Normal Exam      General Survey    Normal Exam   Pulmonary   Normal Exam         ________________________________________________________________________  Plan  ASA Score  3  Anesthetic Plan general    Induction (routine IV); General Anesthesia/Sedation Maintenance Plan (IV bolus and inhaled agents); Airway (LMA); Line ( use current access); Monitoring (standard ASA); Positioning (supine); PONV Plan (dexamethasone, ondansetron and haloperidol); Pain (per surgical team); PostOp (ASC or PACU)    Informed Consent     Risks:          Risks discussed were commensurate with the plan listed above with the following specific points: N/V, aspiration and sore throat , damage to:(eyes, nerves, teeth), allergic Rx, unexpected serious injury    Anesthetic Consent:      Anesthetic plan (and risks as noted above) were discussed with patient    Plan also discussed with team members including:  CRNA, resident and sCRNA    Attending Attestation:  As the primary attending anesthesiologist, I attest that the patient or proxy understands and accepts the risks and benefits of the anesthesia plan. I also attest that I have personally performed a  pre-anesthetic examination and evaluation, and prescribed the anesthetic plan for this particular location within 48 hours prior to the anesthetic as documented. Maximino Sarin, MD 8:00 AM

## 2016-06-25 NOTE — OR PreOp (Addendum)
Pt given  preop instructions per SWG Guidelines    1. Where will you be staying when you are discharged after surgery-pt's home  2.  How will you be getting there-fiance  3. Who will be taking care of you when you get there-fiance    Medications for DOS reviewed with pt  - omeprazole and amlodipine    Pt takes meloxicam 15 mg daily.    [facility] July for C/O CP.  Pt says she wore a holter.  Did not see cardiology.  And results were -neg per pt.  OSA listed in Pt's medical record.  Pt says she snores loudly and has witnessed apnea and is sure she has OSA but has never been tested.  Pt is seen by pain clinic and takes oxycodone 2 tabs daily for "chronic back and knee pain"

## 2016-06-26 ENCOUNTER — Telehealth: Payer: Self-pay | Admitting: Orthopedic Surgery

## 2016-06-26 ENCOUNTER — Encounter: Payer: Self-pay | Admitting: Orthopedic Surgery

## 2016-06-26 ENCOUNTER — Encounter: Payer: Self-pay | Admitting: Nurse Practitioner

## 2016-06-26 ENCOUNTER — Ambulatory Visit
Admission: RE | Admit: 2016-06-26 | Discharge: 2016-06-26 | Disposition: A | Discharge status: Home / Self Care | Payer: Self-pay | Source: Ambulatory Visit | Attending: Orthopedic Surgery | Admitting: Orthopedic Surgery

## 2016-06-26 ENCOUNTER — Encounter
Admission: RE | Disposition: A | Discharge status: Home / Self Care | Payer: Self-pay | Source: Ambulatory Visit | Attending: Orthopedic Surgery

## 2016-06-26 LAB — POCT URINE PREGNANCY: Lot #: 170601

## 2016-06-26 SURGERY — ARTHROSCOPY, KNEE, WITH CHONDROPLASTY
Anesthesia: General | Site: Knee | Laterality: Right | Wound class: Clean

## 2016-06-26 MED ORDER — LIDOCAINE HCL 2 % IJ SOLN *I*
INTRAMUSCULAR | Status: DC | PRN
Start: 2016-06-26 — End: 2016-06-26
  Administered 2016-06-26 (×2): 100 mg via INTRAVENOUS

## 2016-06-26 MED ORDER — CEFAZOLIN 1000 MG IN STERILE WATER 10ML SYRINGE *I*
2000.0000 mg | PREFILLED_SYRINGE | INTRAVENOUS | Status: AC
Start: 2016-06-26 — End: 2016-06-26
  Administered 2016-06-26: 2000 mg via INTRAVENOUS

## 2016-06-26 MED ORDER — EPINEPHRINE 1 MG/ML IJ SOLUTION WRAPPED *I*
INTRAMUSCULAR | Status: AC
Start: 2016-06-26 — End: 2016-06-26
  Filled 2016-06-26: qty 30

## 2016-06-26 MED ORDER — EPINEPHRINE 1 MG/ML IJ SOLUTION WRAPPED *I*
INTRAMUSCULAR | Status: DC | PRN
Start: 2016-06-26 — End: 2016-06-26
  Administered 2016-06-26: 2 mg

## 2016-06-26 MED ORDER — OXYCODONE HCL 5 MG PO TABS *I*
5.0000 mg | ORAL_TABLET | ORAL | 0 refills | Status: DC | PRN
Start: 2016-06-26 — End: 2016-06-26

## 2016-06-26 MED ORDER — OXYCODONE HCL 5 MG PO CAPS *A*
5.0000 mg | ORAL_CAPSULE | ORAL | 0 refills | Status: DC | PRN
Start: 2016-06-26 — End: 2016-06-26

## 2016-06-26 MED ORDER — HYDROMORPHONE HCL PF 1 MG/ML IJ SOLN *WRAPPED*
0.5000 mg | INTRAMUSCULAR | Status: DC | PRN
Start: 2016-06-26 — End: 2016-06-27
  Administered 2016-06-26: 0.5 mg via INTRAVENOUS

## 2016-06-26 MED ORDER — OXYCODONE HCL 5 MG/5ML PO SOLN *I*
ORAL | Status: AC
Start: 2016-06-26 — End: 2016-06-26
  Filled 2016-06-26: qty 10

## 2016-06-26 MED ORDER — PREGABALIN 75 MG PO CAPS *A*
150.0000 mg | ORAL_CAPSULE | Freq: Once | ORAL | Status: AC
Start: 2016-06-26 — End: 2016-06-26
  Administered 2016-06-26: 150 mg via ORAL

## 2016-06-26 MED ORDER — ACETAMINOPHEN 80 MG PO TBDP *I*
960.0000 mg | ORAL_TABLET | Freq: Once | ORAL | Status: AC
Start: 2016-06-26 — End: 2016-06-26

## 2016-06-26 MED ORDER — ACETAMINOPHEN 325 MG PO TABS *I*
975.0000 mg | ORAL_TABLET | Freq: Once | ORAL | Status: AC
Start: 2016-06-26 — End: 2016-06-26
  Administered 2016-06-26: 975 mg via ORAL

## 2016-06-26 MED ORDER — OXYCODONE HCL 5 MG/5ML PO SOLN *I*
5.0000 mg | Freq: Once | ORAL | Status: AC | PRN
Start: 2016-06-26 — End: 2016-06-26

## 2016-06-26 MED ORDER — HYDROMORPHONE HCL PF 1 MG/ML IJ SOLN *WRAPPED*
INTRAMUSCULAR | Status: AC
Start: 2016-06-26 — End: 2016-06-26
  Filled 2016-06-26: qty 1

## 2016-06-26 MED ORDER — LIDOCAINE HCL 1 % IJ SOLN *I*
INTRAMUSCULAR | Status: AC
Start: 2016-06-26 — End: 2016-06-26
  Filled 2016-06-26: qty 2

## 2016-06-26 MED ORDER — ACETAMINOPHEN 325 MG PO TABS *I*
ORAL_TABLET | ORAL | Status: AC
Start: 2016-06-26 — End: 2016-06-26
  Filled 2016-06-26: qty 3

## 2016-06-26 MED ORDER — IPRATROPIUM-ALBUTEROL 0.5-2.5 MG/3ML IN SOLN *I*
3.0000 mL | Freq: Four times a day (QID) | RESPIRATORY_TRACT | Status: DC
Start: 2016-06-26 — End: 2016-06-27

## 2016-06-26 MED ORDER — KETOROLAC TROMETHAMINE 10 MG PO TABS *I*
10.0000 mg | ORAL_TABLET | Freq: Four times a day (QID) | ORAL | 0 refills | Status: AC | PRN
Start: 2016-06-26 — End: 2016-06-28

## 2016-06-26 MED ORDER — ALBUTEROL SULFATE HFA 108 (90 BASE) MCG/ACT IN AERS *I*
INHALATION_SPRAY | RESPIRATORY_TRACT | Status: DC | PRN
Start: 2016-06-26 — End: 2016-06-26
  Administered 2016-06-26 (×2): 6 via RESPIRATORY_TRACT

## 2016-06-26 MED ORDER — DEXAMETHASONE SODIUM PHOSPHATE 4 MG/ML INJ SOLN *WRAPPED*
INTRAMUSCULAR | Status: DC | PRN
Start: 2016-06-26 — End: 2016-06-26
  Administered 2016-06-26: 4 mg via INTRAVENOUS

## 2016-06-26 MED ORDER — DIAZEPAM 5 MG/ML IJ SOLN *I*
INTRAMUSCULAR | Status: DC | PRN
Start: 2016-06-26 — End: 2016-06-26
  Administered 2016-06-26: 10 mg via INTRAVENOUS

## 2016-06-26 MED ORDER — DEXTROSE IN LACTATED RINGERS 5 % IV SOLN *I*
INTRAVENOUS | Status: DC | PRN
Start: 2016-06-26 — End: 2016-06-26

## 2016-06-26 MED ORDER — LIDOCAINE HCL 1 % IJ SOLN *I*
0.1000 mL | Freq: Once | INTRAMUSCULAR | Status: DC | PRN
Start: 2016-06-26 — End: 2016-06-27
  Administered 2016-06-26: 0.1 mL via SUBCUTANEOUS

## 2016-06-26 MED ORDER — BUPIVACAINE-EPINEPHRINE 0.25 % IJ SOLUTION *WRAPPED*
INTRAMUSCULAR | Status: AC
Start: 2016-06-26 — End: 2016-06-26
  Filled 2016-06-26: qty 60

## 2016-06-26 MED ORDER — CEFAZOLIN 1000 MG IN STERILE WATER 10ML SYRINGE *I*
PREFILLED_SYRINGE | INTRAVENOUS | Status: AC
Start: 2016-06-26 — End: 2016-06-26
  Filled 2016-06-26: qty 20

## 2016-06-26 MED ORDER — MIDAZOLAM HCL 1 MG/ML IJ SOLN *I* WRAPPED
INTRAMUSCULAR | Status: AC
Start: 2016-06-26 — End: 2016-06-26
  Filled 2016-06-26: qty 2

## 2016-06-26 MED ORDER — PREGABALIN 75 MG PO CAPS *A*
ORAL_CAPSULE | ORAL | Status: AC
Start: 2016-06-26 — End: 2016-06-26
  Filled 2016-06-26: qty 2

## 2016-06-26 MED ORDER — ALBUTEROL SULFATE (2.5 MG/3ML) 0.083% IN NEBU *I*
2.5000 mg | INHALATION_SOLUTION | Freq: Once | RESPIRATORY_TRACT | Status: DC | PRN
Start: 2016-06-26 — End: 2016-06-27
  Administered 2016-06-26: 2.5 mg via RESPIRATORY_TRACT

## 2016-06-26 MED ORDER — OXYCODONE HCL 5 MG PO TABS *I*
5.0000 mg | ORAL_TABLET | ORAL | 0 refills | Status: DC | PRN
Start: 2016-06-26 — End: 2016-07-03

## 2016-06-26 MED ORDER — FENTANYL CITRATE 50 MCG/ML IJ SOLN *WRAPPED*
INTRAMUSCULAR | Status: AC
Start: 2016-06-26 — End: 2016-06-26
  Filled 2016-06-26: qty 2

## 2016-06-26 MED ORDER — ACETAMINOPHEN 500 MG PO TABS *I*
1000.0000 mg | ORAL_TABLET | Freq: Three times a day (TID) | ORAL | 0 refills | Status: DC | PRN
Start: 2016-06-26 — End: 2016-09-11

## 2016-06-26 MED ORDER — BUPIVACAINE-EPINEPHRINE 0.25 % IJ SOLUTION *WRAPPED*
INTRAMUSCULAR | Status: DC | PRN
Start: 2016-06-26 — End: 2016-06-26
  Administered 2016-06-26: 10 mL via SUBCUTANEOUS

## 2016-06-26 MED ORDER — ACETAMINOPHEN 160 MG/5 ML PO ORDERABLE *I*
975.0000 mg | Freq: Once | Status: AC
Start: 2016-06-26 — End: 2016-06-26

## 2016-06-26 MED ORDER — KETAMINE HCL 10 MG/ML IJ/IV SOLN *WRAPPED*
Status: AC
Start: 2016-06-26 — End: 2016-06-26
  Filled 2016-06-26: qty 20

## 2016-06-26 MED ORDER — KETOROLAC TROMETHAMINE 30 MG/ML IJ SOLN *I*
INTRAMUSCULAR | Status: DC | PRN
Start: 2016-06-26 — End: 2016-06-26
  Administered 2016-06-26: 30 mg via INTRAVENOUS

## 2016-06-26 MED ORDER — PHENYLEPHRINE 100 MCG/ML IN NS 10 ML *WRAPPED*
INTRAMUSCULAR | Status: DC | PRN
Start: 2016-06-26 — End: 2016-06-26
  Administered 2016-06-26 (×3): 100 ug via INTRAVENOUS

## 2016-06-26 MED ORDER — ONDANSETRON HCL 2 MG/ML IV SOLN *I*
INTRAMUSCULAR | Status: DC | PRN
Start: 2016-06-26 — End: 2016-06-26
  Administered 2016-06-26: 4 mg via INTRAVENOUS

## 2016-06-26 MED ORDER — HALOPERIDOL LACTATE 5 MG/ML IJ SOLN *I*
1.0000 mg | Freq: Once | INTRAMUSCULAR | Status: AC | PRN
Start: 2016-06-26 — End: 2016-06-26

## 2016-06-26 MED ORDER — BACITRACIN-POLYMYXIN B 500-10000 UNIT/GM EX OINT *I*
TOPICAL_OINTMENT | CUTANEOUS | Status: AC
Start: 2016-06-26 — End: 2016-06-26
  Filled 2016-06-26: qty 15

## 2016-06-26 MED ORDER — KETAMINE HCL 10 MG/ML IJ/IV SOLN *WRAPPED*
Status: DC | PRN
Start: 2016-06-26 — End: 2016-06-26
  Administered 2016-06-26: 20 mg via INTRAVENOUS
  Administered 2016-06-26 (×2): 10 mg via INTRAVENOUS

## 2016-06-26 MED ORDER — OXYCODONE HCL 5 MG/5ML PO SOLN *I*
10.0000 mg | Freq: Once | ORAL | Status: AC | PRN
Start: 2016-06-26 — End: 2016-06-26
  Administered 2016-06-26: 10 mg via ORAL

## 2016-06-26 MED ORDER — FENTANYL CITRATE 50 MCG/ML IJ SOLN *WRAPPED*
INTRAMUSCULAR | Status: DC | PRN
Start: 2016-06-26 — End: 2016-06-26
  Administered 2016-06-26: 09:00:00 100 ug via INTRAVENOUS

## 2016-06-26 MED ORDER — EPHEDRINE 5MG/ML IN NS IV/IJ *WRAPPED*
INTRAMUSCULAR | Status: DC | PRN
Start: 2016-06-26 — End: 2016-06-26
  Administered 2016-06-26 (×3): 5 mg via INTRAVENOUS

## 2016-06-26 MED ORDER — ALBUTEROL SULFATE (2.5 MG/3ML) 0.083% IN NEBU *I*
INHALATION_SOLUTION | RESPIRATORY_TRACT | Status: AC
Start: 2016-06-26 — End: 2016-06-26
  Filled 2016-06-26: qty 3

## 2016-06-26 MED ORDER — SUCCINYLCHOLINE CHLORIDE 20 MG/ML IV/IJ SOLN *WRAPPED*
Status: DC | PRN
Start: 2016-06-26 — End: 2016-06-26
  Administered 2016-06-26 (×2): 20 mg via INTRAVENOUS

## 2016-06-26 MED ORDER — DIAZEPAM 5 MG/ML IJ SOLN *I*
INTRAMUSCULAR | Status: AC
Start: 2016-06-26 — End: 2016-06-26
  Filled 2016-06-26: qty 2

## 2016-06-26 MED ORDER — PROPOFOL 10 MG/ML IV EMUL (INTERMITTENT DOSING) WRAPPED *I*
INTRAVENOUS | Status: DC | PRN
Start: 2016-06-26 — End: 2016-06-26
  Administered 2016-06-26: 100 mg via INTRAVENOUS
  Administered 2016-06-26: 20 mg via INTRAVENOUS
  Administered 2016-06-26: 250 mg via INTRAVENOUS

## 2016-06-26 MED ORDER — LACTATED RINGERS IV SOLN *I*
20.0000 mL/h | INTRAVENOUS | Status: DC
Start: 2016-06-26 — End: 2016-06-27
  Administered 2016-06-26: 20 mL/h via INTRAVENOUS

## 2016-06-26 SURGICAL SUPPLY — 29 items
BANDAGE ELAST SLF-CLSR 6X11 LF NONSTER (Dressing) ×2 IMPLANT
BANDAGE ESMARK 6IN LF STER USE 219461 (Dressing) IMPLANT
BLADE GREAT WHITE 4.2MM (Supply) IMPLANT
BLADE GREAT WHITE LG 5.5MM (Supply) IMPLANT
BLADE GREAT WHITE PREBENT 4.2MM (Supply) IMPLANT
BLADE TORPEDO 4MM X 13CM (Other) ×2 IMPLANT
BOOT KNEE HIGH NON SKID (Supply) ×2 IMPLANT
BRACE BREG POST-OP KNEE SHT (Supply) IMPLANT
CAUTERY BOVIE SURGICAL PENCIL (Supply) IMPLANT
COUNTER NEEDLE ADH 10/20FM BLACK STER USE 221513 (Supply) IMPLANT
CUTTER SUTR KNOT PUSHER SLOTTED CAN SET (Other) IMPLANT
DRAPE SHEET 70X100 (Drape) ×1
DRAPE SUR W70XL100IN STD SMS POLYPR FULL SHT W/O FLD PCH DISP (Drape) ×1 IMPLANT
DRESSING CURITY NONADHERE 3X3 (Dressing) ×2 IMPLANT
ELECTRODE VAPR PREMIER 50DEG 3 X 3.65MM (Supply) ×2 IMPLANT
GLOVE SURG PROTEXIS PI CLASSIC 8.0 PF SYN (Glove) ×8 IMPLANT
GOWN SIRIUS RAGLAN NONREINFORCED XL (Gown) ×2 IMPLANT
HANDLE LITE EZ RIGID NL (Supply) IMPLANT
PACK CUSTOM ACL CDS (Pack) IMPLANT
PACK CUSTOM ARTHROSCOPY KNEE CDS (Pack) ×2 IMPLANT
PAD GROUNDING ADULT SURE FIT (Supply) IMPLANT
SET TBNG GRAVITY FOUR-SPIKE (Supply) ×2 IMPLANT
SOL LACT RINGER IRRIG 3000ML BAG (Drug) ×4 IMPLANT
SUTR ETHILON MONO 3-0 PS-2 BLACK (Suture) ×2 IMPLANT
SUTR ORTHOCORD 2-0 MO-7 VIOLET BLUE (Suture) IMPLANT
SUTR VICRYL ANTIB BRAID 1 CT-1 36IN VIOL (Suture) IMPLANT
SUTR VICRYL ANTIB BRD 2-0 CP-2 18 UNDYED (Suture) IMPLANT
TIP SUCT FRAZIER 12FR (Supply) IMPLANT
TUBING MEDI-VAC NONCONDUC 12FT X 3/16IN (Tubing) ×2 IMPLANT

## 2016-06-26 NOTE — Anesthesia Postprocedure Evaluation (Signed)
Anesthesia Post-Op Note    Patient: [name]    Procedure(s) PerformedGlennis Brink:  Procedure Summary     Date Anesthesia Start Anesthesia Stop Room / Location    06/26/16 0859 [id number] [id number]       Procedure Diagnosis Surgeon Attending Anesthesia    chondroplasty right knee with foreign body removal   (Right Knee) Chondromalacia  (Chondromalacia [M94.20]) Arvil PersonsGiordano, Brian D, MD Maximino SarinLindenmuth, Tarryn Bogdan, MD        Recovery Vitals  BP: 140/88 (06/26/2016 11:00 AM)  Heart Rate: 88 (06/26/2016 11:00 AM)  Heart Rate (via Pulse Ox): 86 (06/26/2016 11:00 AM)  Resp: 16 (06/26/2016 11:30 AM)  Temp: 36.4 C (97.5 F) (06/26/2016 11:30 AM)  SpO2: 100 % (06/26/2016 11:00 AM)  O2 Device: None (Room air) (06/26/2016 11:30 AM)  O2 Flow Rate: 2 L/min (06/26/2016 11:00 AM)   0-10 Scale: 7 (06/26/2016 11:30 AM)  Anesthesia type:  General  Complications Noted During Procedure or in PACU:  None   Comment:    Patient Location:  PACU  Level of Consciousness:    Recovered to baseline  Patient Participation:     Able to participate  Temperature Status:    Normothermic  Oxygen Saturation:    Within patient's normal range  Cardiac Status:   Within patient's normal range  Fluid Status:    Stable  Airway Patency:     Yes  Pulmonary Status:    Baseline  Pain Management:    Adequate analgesia  Nausea and Vomiting:  None    Post Op Assessment:    Tolerated procedure well   Attending Attestation:  All indicated post anesthesia care provided     -

## 2016-06-26 NOTE — INTERIM OP NOTE (Signed)
Interim Op Note (Surgical Log ID: [id number])       Date of Surgery: 06/26/2016       Surgeons: Moishe SpiceSurgeon(s) and Role:     * Arvil PersonsGiordano, Brian D, MD - Primary     * Gloris ManchesterSchaffer, Okey RegalJoseph C, MD - Resident - Assisting       Pre-op Diagnosis: Pre-Op Diagnosis Codes:     * Chondromalacia [M94.20]       Post-op Diagnosis: Post-Op Diagnosis Codes:     * Chondromalacia [M94.20]       Procedure(s) Performed: Procedures:    * chondroplasty right knee with foreign body removal         Additional CPT Codes: [id number]: Pr Knee Scope,Shave Articular Cart;         Anesthesia Type: General        Fluid Totals: I/O this shift:  09/06 0700 - 09/06 1459  In: 1700 (19.4 mL/kg) [P.O.:400; I.V.:1300]  Out: 10 (0.1 mL/kg) [Blood:10]  Net: 1690  Weight: 87.5 kg        Estimated Blood Loss: Blood Loss: 10 mL       Specimens to Pathology:  * No specimens in log *       Temporary Implants:        Packing:                 Patient Condition: good       Findings (Including unexpected complications): Foreign body (suture button) in suprapatellar soft tissue at anterior femur - removed  Patellofemoral chondromalacia  Suprapatellar fibrosis - debrided     Signed:  Ambrose MantleJoseph C Chantae Soo, MD  on 06/26/2016 at 12:48 PM

## 2016-06-26 NOTE — Progress Notes (Signed)
All ready for discharge, just waiting for medicab.

## 2016-06-26 NOTE — Progress Notes (Signed)
[facility]  Peri-Operative Care Note      Patient is seen immediate post-op right knee surgery - arthroscopy performed 06/26/2016.    Pain Assessment  Pain control measures reviewed, including appropriate use of ice and medications as prescribed. Patient is to call MD in the event of uncontrolled pain, nausea/vomiting or fever or with any additional concerns.    Joint Protection and Education  Proper don/doff of brace instructed pre-operatively, reviewed/confirmed post-operatively.   Joint protection concepts for ADLs, dressing, sleeping and self-care instructed pre-operatively; reviewed/confirmed post-operatively.     Immediate Post-Operative Exercises  Patient was instructed to perform exercises as follows per post-operative protocol:   Foot/ankle- Ankle pumps, begin today    Knee- Quad sets, begin today    Knee- PROM - flex, ext, begin tomorrow, as tolerated    Activity Counseling  Touch down Weight-Bearing - right lower extremity for 48 hours, then progress as tolerated.  Patient was educated on crutch ambulation including stairs.  Patient was advised to avoid stairs for the first 24-48 hours.  Patient was instructed not to drive.    Goals:  1. Pt independent with joint protection concepts for ADLs  2. Pt independent with joint ROM/muscular exercises as instructed and appropriate    Patient/family provided contact information for additional questions/concerns/follow-up care.  Patient was scheduled for therapy with a location of her choice.  All questions were invited and answered to their satisfaction.    Gracy BruinsLisa Kanna Dafoe, ATC

## 2016-06-26 NOTE — Discharge Instructions (Addendum)
Knee Arthroscopy   Ina Kick, MD         [phone]          [phone]                 Phone:  [place]   [address] [place], [place] [address]    DISCHARGE/POST-OPERATIVE INSTRUCTIONS: KNEE ARTHROSCOPY    ON THE DAY OF YOUR SURGERY   You must be accompanied by a responsible adult upon discharge and for 24 hours after surgery.   Be aware of dizziness, which may cause you to fall. Change positions slowly.   Do not make any important decisions for 24 hours due to the potential lingering effects of anesthesia.    ACTIVITY   You may bear weight as tolerated, but use crutches for the first 48 hours for additional support and stability while walking. Physical therapy will help you wean off crutches and return to your desired activities.   Wiggle the toes of your surgical extremity, squeeze/contract your quadriceps muscle, and pump your foot/ankle up and down at least 10 times/hour and whenever you think about it. This will promote circulation within your operative extremity.    DIET   Begin with clear liquids and advance to your regular diet as tolerated.    DRESSINGS   Keep your dressings clean and dry. They may be removed 2-3 days after your surgery. At this time, apply Band-Aids to your incisions until your stitches are removed, 7-10 days after surgery by your surgeon or physical therapist.   You can get your surgical incisions wet in the shower after dressings are removed. **Do not submerge your operative knee in a bath, pool, or Jacuzzi for at least 2 weeks after surgery.   After removing your dressings, rewrap your ACE wrap around the knee.    ICE   Use a well-sealed bag of ice over your knee intermittently. A good general rule is to ice your operative area for 20 minutes at a time on and off.   Ice your knee after therapy and as long as pain and swelling persist.    NAUSEA/VOMITING   Nausea/vomiting may occur after surgery due to lingering effects of your  anesthetic or pain medications. These effects should be short lived. If you feel nauseous on returning home, decrease activities and return to a liquid diet. If the problem persists, call your surgeon.    MEDICATION     You should not drive, operate machinery/power tools, or drink alcoholic beverages while you are taking prescription narcotic pain medication.   Do not take additional Tylenol (Acetaminophen) with narcotic pain medication.   Ibuprofen (Alleve, Motrin) may be taken for added pain control.   Take pain medication with food as it may cause nausea/upset stomach.   Pain medication may cause constipation in which case over the counter stool   softeners are usually effective.    DRIVING   For surgery involving the right knee or if you drive a standard transmission, you may drive AFTER you are off crutches and all narcotic pain medications.   For surgery involving your left knee, you may drive when you are comfortable and off all narcotic pain medication.  CALL YOUR DOCTOR FOR ANY OF THE FOLLOWING   Fevers > 101   Redness, warmth, or persistent drainage from the surgical incision   Pain that does not improve with pain medication   Persistent nausea or vomiting into the next day.   Bleeding or continuous oozing  from your surgical incisions   Increased swelling in your toes or severe tightness within your operative leg (not improved by loosening your ACE wrap or elevating your limb above your heart)   Pale, blue, or cold toe nail beds (compared with your non-operative leg)   Notify your surgeon if you have not urinated within 12 hours after discharge.    FOLLOWUP CARE   Your first postoperative (7-10 days after surgery) visit will be arranged when   you call to schedule surgery. If this was not arranged preoperatively, please   call immediately following surgery to be seen within 7-10 days from the day   of your surgery.   Physical Therapy will begin 2-3 days following surgery.   If you  have any questions regarding your postoperative care, please call Dr.   Idamae LusherGiordano at [phone]   If you need to change your appointment, please call [phone]  I have been instructed in and received a copy of the discharge instructions. I have  had the opportunity to ask questions regarding my postoperative care and my  questions have been answered satisfactorily.      Due to the sedation medication and or general anesthesia you have been given today, please follow these discharge instructions:    [x]  Do not drive or operate any machinery for 24 hours or the specified time frame that was recommended by your doctor, please refer to post op instructions.  [x]  Do not drink any alcoholic beverages for 24 hours after your procedure and/or if you are taking a narcotic pain reliever (e.g. Vicodin, Percocet or Tylenol #3).  [x]  Do not make any major decisions or sign contracts for 24 hours.  []  Prescription information provided from [facility].   [x]  Prescription information given to patient and/or patient representative, prescription not filled at [facility].    Diet: begin with liquids, advance as tolerated.    Your last pain medication was given to you at: Toradol (like ibuprofen) given at 9:30 a.m.                                                                              Oxycodone given at 10:45 a.m.    Your next dose of pain medication is due after: Oxycodone due at 2:45 p.m as prescribed                                                                              Toradol due at 3:30 p.m. As prescribed  Tylenol due at 3:30 p.m. As prescribed    If unable to reach your doctor at [phone], call 911 for a true emergency or go to your nearest emergency department.    It is our recommendation that you have someone stay with you for 24 hours following your procedure.

## 2016-06-26 NOTE — Anesthesia Case Conclusion (Signed)
CASE CONCLUSION  Emergence  Actions:  Deep extubation, suctioned and soft bite block  Criteria Used for Airway Removal:  Adequate Tv & RR and acceptable O2 saturation  Assessment:  Unexpected delayed  Transport  Directly to: PACU  Airway:  Facemask  Oxygen Delivery:  10 lpm  Position:  Upright  Patient Condition on Handoff  Level of Consciousness:  Alert/talking/calm  Patient Condition:  Stable  Handoff Report to:  RN

## 2016-06-26 NOTE — Anesthesia Procedure Notes (Signed)
---------------------------------------------------------------------------------------------------------------------------------------    AIRWAY   GENERAL INFORMATION AND STAFF    Patient location during procedure: OR  CONDITION PRIOR TO MANIPULATION     Current Airway/Neck Condition:  Normal        For more airway physical exam details, see Anesthesia PreOp Evaluation  AIRWAY METHOD     Patient Position:  Sniffing    Preoxygenated: yes      Induction: IV  Mask Difficulty Assessment:  1 - vent by mask    Number of Attempts at Approach:  1    Number of Other Approaches Attempted:  0  FINAL AIRWAY DETAILS    Final Airway Type:  LMA    Adjunct Airway: soft bite block    Final LMA: Unique    LMA Size: 3  ----------------------------------------------------------------------------------------------------------------------------------------

## 2016-06-26 NOTE — Addendum Note (Signed)
Addended by: Fransisca ConnorsKAPLAN, Capria Cartaya on: 06/26/2016 08:01 PM     Modules accepted: Orders

## 2016-06-26 NOTE — Op Note (Signed)
Patient: [name]  MRN: [id number]   DOS: 06/26/2016     Surgeon: Arlys John D. Idamae Lusher MD    First Assistant: Samule Ohm [facility]    Preoperative Diagnosis:    Right knee symptomatic lateral meniscus tear  patellofemoral, trochlear and lateral tibial plateau chondromalacia  Synovitis  Intraarticular loose body/bodies    Postoperative Diagnosis:   Same    Procedure:  Right knee diagnostic and operative arthroscopy  Partial lateral meniscectomy 20%  Synovectomy infrapatellar, intraarticular notch, medial compartment, lateral compartment and patellofemoral compartment  Chondroplasty patellofemoral and lateral compartment  Arthroscopic loose body removal    Anesthesia Type: General with LMA.     Estimated Blood Loss: Minimal    Fluid Intake: Per anesthesia record    Urine Output: None    Specimens: None    Tourniquet time: None    Implants: None    Patient Condition at Termination of Procedure: Stable to PACU    Complications: None    Intraoperative Findings:   Synovium diffusely hyperemic and injected with frond-like projections  Infrapatellar synovial fat pad thickened and hypertrophic with evidence of impingement on terminal extension  Medial meniscus Normal  Lateral meniscus Radial tear degenerative posterior horn  ACL intact but attenuated; PCL intact and taut  Articular cartilage Patella Grade III 30% with delamination  Trochlea Grade III 30% with delamination  Medial Compartment Grade II 20% with delamination  Lateral Compartment Grade IV 20% with delamination  Patella tracking normal    Procedure:    The patient was identified in the pre-anesthesia holding area and the right knee was marked by the attending surgeon as the corrective operative site. Pre-operative antibiotics were administered within 30 minutes on call to the operating room. The patient was then brought to the operating room and placed supine on the operating table. All bony prominences were well padded.     The patient underwent induction of general LMA  anesthesia. The operative extremity was then prepped and draped in the usual sterile manner for the procedure. A surgical pause was undertaken, identifying the right knee as the correct surgical site. A well padded stress post was raised. Proposed arthroscopy portal sites were injected with 2-3 cc of 1% Lidocaine to further anesthetize the portal tracts. An anterolateral portal was established in the usual fashion. Diagnostic arthroscopy commenced with findings noted above. Under the guidance of an 18 gauge spinal needle, an anteromedial portal was established just superior to the medial meniscus.     A 30 degree arthroscope was brought into the patellofemoral compartment. The patellar articular cartilage was delaminated, A full radius shaver was introduced into the patellofemoral compartment via the anteromedial portal and chondroplasty of the patellar articular cartilage was carried out, stabilizing the peripheral border where chondral delamination was present; microfracture was not performed. The trochlear articular cartilage was delaminated, A full radius shaver was introduced into the patellofemoral compartment via the anteromedial portal and chondroplasty of the trochlear cartilage was carried out, stabilizing the peripheral border where chondral delamination was present microfracture was not peformed Synovium within the patellofemoral space was hyperemic. A shaver was brought into the patellofemoral space and synovectomy was completed, removing hyperemic and injected portions of synovium. Medial and lateral gutter spaces were inspected and a metallic foreign body representing an implant from the patient's prior ACL reconstruction was present within the patellofemoral space. It measured 1cm x 5mm. This was removed in their entirety with an arthroscopic grasper.    Patellar tracking was assessed with the knee flexed  and extended. The patella was noted to track centrally.     Next, the knee was flexed and the  arthroscope was brought into the medial compartment. Synovectomy of friable and hypertrophic synovium was completed. The operative extremity was brought into a valgus position using a well padded valgus stress post. The medial meniscus was carefully inspected and probed. The meniscus was normal, intact, and in continuity throughout its course.The medial compartment was then evacuated of all residual meniscal debris.The medial compartment articular cartilage demonstrated delamination. A full radius shaver was introduced into the medial compartment via the anteromedial portal and chondroplasty of delaminated articular cartilage was carried out, stabilizing the peripheral border where chondral delamination was present microfracture was not performed.    The arthroscope was then brought into the intraarticular notch space. The infrapatella fat pad and synovium were hypertrophic and demonstrated impingement in terminal extension. The infrapatellar fat pad and synovium were debrided. The ACL and PCL demonstrated attrition and attenuation but was overall intact.     Next, the knee was brought to a figure-of-4 position, allowing exposure of the lateral compartment and lateral meniscus. Synovectomy of friable and hypertrophic synovium was completed. The popliteus was visualized and was found to be intact. The lateral meniscus was carefully inspected and probed. The meniscus demonstrated tearing at the posterior horn. The tear type was radial. The tear was deemed irreparable based on appearance and stability. Using a combination of shaving and biting instruments, the unstable margin of the meniscus was trimmed back to a stable contour. Further probing demonstrated that the periphery of the meniscus was now stable following trimming. The lateral compartment was then evacuated of all residual meniscal debris. The lateral compartment articular cartilage demonstrated delamination. A full radius shaver was introduced into the  medial compartment via the anteromedial portal and chondroplasty of delaminated articular cartilage was carried out, stabilizing the peripheral border where chondral delamination was present microfracture was not performed.    After completion of all necessary meniscal, chondral, and synovial work, meticulous hemostasis was obtained and the knee was finally evacuated of all residual soft tissue debris. Incisions were copiously irrigated and closed with 3-0 Nylon sutures. Sterile dressings were applied and overwrapped with an Ace wrap. The patient was awoken from anesthesia in stable condition. All sharps and counts were correct at the conclusion of the case. No intraoperative complications were noted. I was present and scrubbed for all portions of the procedure.

## 2016-06-26 NOTE — Telephone Encounter (Signed)
Pt called answering service reporting her insurance would not cover Oxy capsules ordered today. [facility] on [address]. Discontinued Oxy capsules at [facility] and called pharmacy to confirm cancellation.    Fransisca ConnorsNathan Fantasia Jinkins, MD  Orthopaedic Surgery  06/26/2016, 5:44 PM

## 2016-06-26 NOTE — H&P (Signed)
UPDATES TO PATIENT'S CONDITION on the DAY OF SURGERY/PROCEDURE    I. Updates to Patient's Condition (to be completed by a provider privileged to complete a H&P, following reassessment of the patient by the provider):    Day of Surgery/Procedure Update:  History  History reviewed and no change    Physical  Physical exam updated and no change            II. Procedure Readiness   I have reviewed the patient's H&P and updated condition. By completing and signing this form, I attest that this patient is ready for surgery/procedure.    III. Attestation   I have reviewed the updated information regarding the patient's condition and it is appropriate to proceed with the planned surgery/procedure.    Arvil PersonsBrian D Jamerica Snavely, MD as of 8:54 AM 06/26/2016

## 2016-06-30 ENCOUNTER — Telehealth: Payer: Self-pay | Admitting: Student in an Organized Health Care Education/Training Program

## 2016-06-30 MED ORDER — OXYCODONE HCL 5 MG PO TABS *I*
5.0000 mg | ORAL_TABLET | Freq: Four times a day (QID) | ORAL | 0 refills | Status: DC | PRN
Start: 2016-06-30 — End: 2016-07-03

## 2016-06-30 NOTE — Telephone Encounter (Signed)
Patient called to request additional pain medicine. Was given 50 oxycodone tabs post-op. States she has been taking them as prescribed 1-2 tabs every 6 hours and only has a few left. Says she's in a lot of pain and would like enough just to get her to her appointment this week. Sent a script for 20 additional tabs to her pharmacy.     Cipriano Mileouglas Brinnley Lacap, MD   Orthopaedic Surgery Resident  06/30/2016 6:58 PM

## 2016-07-03 ENCOUNTER — Encounter: Payer: Self-pay | Admitting: Orthopedic Surgery

## 2016-07-03 ENCOUNTER — Ambulatory Visit: Payer: Self-pay | Admitting: Orthopedic Surgery

## 2016-07-03 VITALS — BP 139/91 | HR 94 | Ht 68.0 in | Wt 191.0 lb

## 2016-07-03 DIAGNOSIS — M25561 Pain in right knee: Secondary | ICD-10-CM

## 2016-07-03 MED ORDER — OXYCODONE HCL 5 MG PO TABS *I*
5.0000 mg | ORAL_TABLET | Freq: Four times a day (QID) | ORAL | 0 refills | Status: DC | PRN
Start: 2016-07-03 — End: 2016-09-11

## 2016-07-03 NOTE — Progress Notes (Signed)
[name]:   [name], [name]  MR #:  [id number]   ACCOUNT #:  [id number] DOB:  03/03/1976   DICTATED BY:  Chaya Jananiel G Jahmez Bily, PA DATE OF VISIT:  07/03/2016     CHIEF COMPLAINT:  One week post right knee arthroscopic surgery with meniscectomy and chondroplasty.    INTERVAL HISTORY:  [name] states that she is doing well.  She has continued to have pain.  She is asking for a refill of her oxycodone at this point.  She has not started physical therapy as she states that she did not have a PT script.  She denies any calf pain.  Denies any fever or chills.  Denies any drainage from her incisions.    PHYSICAL EXAMINATION:  [name] is a 40 year old woman, alert and oriented x3.  Incisions are intact.  No signs of infection.  She can fully extend the knee to 0 degrees.  Flexion is to 120.  Calves are soft and nontender bilaterally.  She can extend and flex the knee against resistance.  Good strength.  No pain.  Distal neurovascular exam intact.    ASSESSMENT AND PLAN:  [name] is a 40 year old woman now 1 week post right knee arthroscopic surgery with meniscectomy and chondroplasty.  At this time, I did provide her with a script for physical therapy.  I will write her a small dose of oxycodone 5 mg immediate release #20, taking 1 tab every 6 hours as needed for pain as she cannot take nonsteroidal antiinflammatory drugs due to a history of peptic ulcer.  I would like her to transition to Tylenol after this script.  She will follow up with us in 4-6 weeks to check her progress.  Perioperative photos were reviewed.  Sutures were removed.  Steri-Strips applied.  She will continue icing and elevating at this point.       Dictated By:  Chaya Jananiel G Seraphim Trow, PA      ______________________________  Arvil PersonsBrian D Giordano, MD    DGK/MODL  DD:  07/03/2016 16:06:03  DT:  07/03/2016 16:40:16  Job #:  [id number]    cc:

## 2016-07-23 ENCOUNTER — Ambulatory Visit: Payer: Self-pay | Admitting: Rehabilitative and Restorative Service Providers"

## 2016-07-23 ENCOUNTER — Encounter: Payer: Self-pay | Admitting: Rehabilitative and Restorative Service Providers"

## 2016-07-23 DIAGNOSIS — M25561 Pain in right knee: Secondary | ICD-10-CM

## 2016-07-23 NOTE — Progress Notes (Unsigned)
Department of Physical Medicine & Rehabilitation  Physical Therapy Initial Assessment      HISTORY:       Past Medical History:   Diagnosis Date    Anemia     Anxiety 01/12/2016    Arthritis     Depression     DJD (degenerative joint disease), lumbar     small disc disease    DUB (dysfunctional uterine bleeding)     Fibromyalgia 01/12/2016    GERD (gastroesophageal reflux disease)     Hypertension     Nicotine dependence 01/12/2016    Peptic ulcer     Sciatica of right side      Past Surgical History:   Procedure Laterality Date    ANTERIOR CRUCIATE LIGAMENT REPAIR Right 01/24/2015    CESAREAN SECTION, LOW TRANSVERSE  2007    CHOLECYSTECTOMY  1996       Referring practitioner: Arvil PersonsGiordano, Brian D, MD  Onset date on symptoms/Date of Surgery:  06/26/16  Mechanism of injury:   Diagnostic tests:   Previous Treatments: PT for back and knee pain this year  Work Status:   Sport(s)/Activities:     SUBJECTIVE:   Symptom location: right knee  Relevant symptoms:   Symptom frequency:    Symptom intensity (0 - 10 scale): Now {PAIN AVWUJW:11914}LEVELS:22940} Best {PAIN LEVELS:22940} Worst {PAIN LEVELS:22940}   Symptoms worsen with/Current functional limitations:     Symptoms improve with:       Patients goals for therapy: Reduce pain      OBJECTIVE:  Observation: Pleasant, cooperative female in NAD.    Gait:   Palpation: {Swelling pain deformity:60594}   Sensation: {Sensation:23628}       Active Range of Motion Right Left   Hip Flexion     Hip Extension     Hip Abduction     Hip Adduction     Hip IR     Hip ER     Knee extension 0    Knee Flexion 120    Ankle PF     Ankle DF     Ankle Inversion     Ankle Eversion     Great toe extension     *Indicates pain    Strength:      LE Muscle group Right  Left    Hip Flexors       Hip Extensors     Hip Abductors     Hip Adductors     Knee Extensors      Knee Flexors      Ankle Dorsiflexors     Ankle Plantar flexors      Ankle Inverters      Ankle Evertors     Extensor hallucis longus           Flexibility: {DESC; LE FLEXIBILITY:37103}     Special Tests:      Hip: {Hip Test:23753}  Knee: {Knee Tests:23754:p}  Ankle: {Ankle / foot:23755:p}     Balance: ***  Endurance: {Desc; good/fair/poor:18582}  Exercises: See flowsheet for details  Patient Education:     Functional outcome measures: ***    ASSESSMENT:  [name] is a 40 y.o. female who presents to physical therapy with pain, decreased ROM, weakness, postural and gait deviations secondary to signs and symptoms consistent with  . Skilled physical therapy services indicated to increase function and to address goals below.     The following comorbidities may affect treatment/recovery: Anxiety  Fibromyalgia and the following Personal factors may affect treatment/recovery: none  identified.   Clinical presentation: stable    Patient complexity is low level as indicated by above personal factors, environmental factors and comorbidities in addition to their impairments found on physical exam.      Rehab potential/prognosis: good  Patient's understanding: good     Short term goals: 4 weeks  Pain:  {pain goals:29925}  HEP:  {HEP goals:29926}  Functional Mobility/Activities:   {goals:29928}  Functional Outcomes:   {Functional Outcome goals:29932}     Long term goals: 8 weeks  Pain:  {pain goals:29925}  HEP:  {HEP goals:29926}  Functional Mobility/Activities:   {goals:29928}  Functional Outcomes:   {Functional Outcome goals:29932}    PLAN:  Plan of care: Appropriate for PT  PT interventions: AROM/PROM/Therapeutic exercise, Balance activites, Closed chain activites, Cold, Flexibility, Gait training/Functional activities, General conditioning, Home exercise program instruction, Joint mobilizations, Manual therapy, Patient/Family Education, Postural training/body Curator education, Proprioceptive training, Strengthening  PT frequency:  Twice a week  PT duration: 8 weeks                                                                               *Based on  clinical judgement, objective measures and subjective reports      Thank you for the referral.  If you have any questions and/or concerns, please feel free to contact me at [phone].        ***

## 2016-07-23 NOTE — Progress Notes (Signed)
Physical Therapy Daily Flowsheet:  *Please see Physical Therapy Exercise Flowsheet for details regarding exercises completed this session.*     07/23/16 1100   Overview   Diagnosis post right knee arthroscopic surgery with meniscectomy and chondroplasty.   Insurance [facility], [facility])   Script Date 07/23/16  (07/03/16)   Visit # 1   Missed Visit (Pt arrived 25 minutes late to appt, rescheduled for 10:30 am)   Additional Comments IE see note   Patient Education   Patient Education Yes   Educated in disease process Yes   Educated in home exercise program Yes   Educated in proper body mechanics Yes   Educated in proper postural mechanics and management Yes   Time Calculation   PT Timed Codes 5   PT Untimed Codes 25   PT Unbilled Time 0   PT Total Treatment 30   Plan and Onset date   Plan of Care Date 07/23/16   Onset Date 06/26/16   Treatment Start Date 07/23/16   Charges   CC Charges PT Eval Low complexity - code 7161     Raynelle FanningJulie Buerger, PT, DPT

## 2016-07-23 NOTE — Progress Notes (Signed)
Physical Therapy Exercise Flowsheet:  *Please refer to Physical Therapy Daily Flowsheet for further details of this session.*     07/23/16 1100   Lumbar/Abdominal Exercises   Single Knee To Chest w/ Straight Leg Raise Comment HEP   Hip Exercises   Hip Abduction, Sidelying Comment HEP   Hip Extension, Standing Comment HEP   Knee Exercises   Straight Leg Raise Comment HEP     Raynelle FanningJulie Buerger, PT, DPT

## 2016-07-23 NOTE — Progress Notes (Signed)
Department of Physical Medicine & Rehabilitation  Physical Therapy Initial Assessment      HISTORY:     post right knee arthroscopic surgery with meniscectomy and chondroplasty         Past Medical History:   Diagnosis Date    Anemia     Anxiety 01/12/2016    Arthritis     Depression     DJD (degenerative joint disease), lumbar     small disc disease    DUB (dysfunctional uterine bleeding)     Fibromyalgia 01/12/2016    GERD (gastroesophageal reflux disease)     Hypertension     Nicotine dependence 01/12/2016    Peptic ulcer     Sciatica of right side            Past Surgical History:   Procedure Laterality Date    ANTERIOR CRUCIATE LIGAMENT REPAIR Right 01/24/2015    CESAREAN SECTION, LOW TRANSVERSE  2007    CHOLECYSTECTOMY  1996       Referring practitioner: Arvil PersonsGiordano, Brian D, MD  Onset date on symptoms/Date of Surgery:  06/26/16  Previous Treatments: PT for back and knee pain this year  Work Status: NA  Sport(s)/Activities: walking    SUBJECTIVE:   Symptom location: right knee  Relevant symptoms: sharp pains from low back to right foot   Symptom frequency:  constant   Symptom intensity (0 - 10 scale): Now 8 Best 8 Worst 8   Symptoms worsen with/Current functional limitations:   stairs, walking > 5 minutes, feels unstable, afraid of falling   Symptoms improve with:   rest    Patients goals for therapy: Reduce pain      OBJECTIVE:  Observation: Pleasant, cooperative female in NAD.    Gait: antalgic, no device. Trialed ambulation with straight cane, decreased antalgic pattern and pt reported more comfortable walking  Palpation: WNL   Sensation: No deficits noted       Active Range of Motion Right Left   Hip Flexion     Hip Extension     Hip Abduction     Hip Adduction     Hip IR     Hip ER     Knee extension 0    Knee Flexion 120    Ankle PF     Ankle DF     Ankle Inversion     Ankle Eversion     Great toe extension     *Indicates pain    Strength:      LE  Muscle group Right  Left    Hip Flexors  5    Hip Extensors 4    Hip Abductors 4    Hip Adductors     Knee Extensors  4+    Knee Flexors 4+    Ankle Dorsiflexors 5    Ankle Plantar flexors      Ankle Inverters      Ankle Evertors     Extensor hallucis longus          Flexibility: Hamstring:  Poor       Balance: UE support required  Endurance: fair   Exercises: See flowsheet for details  Patient Education:     Patient Education Yes   Educated in disease process Yes   Educated in home exercise program Yes   Educated in proper body mechanics Yes   Educated in proper postural mechanics and management Yes       Functional outcome measures: LEFS: 6/80    ASSESSMENT:  [name] is a 40 y.o. female who presents to physical therapy with pain, decreased ROM, weakness, postural and gait deviations secondary to s/p right knee arthroscopic surgery with meniscectomy and chondroplasty. Skilled physical therapy services indicated to increase function and to address goals below.     The following comorbidities may affect treatment/recovery: Anxiety  Fibromyalgia and the following Personal factors may affect treatment/recovery: none identified.     Clinical presentation: stable    Patient complexity is low level as indicated by above personal factors, environmental factors and comorbidities in addition to their impairments found on physical exam.     Rehab potential/prognosis: good  Patient's understanding: good     Short term goals: 4 weeks  Pain:  Patient will report a decrease in their maximal pain level by 3 points.  HEP:  Patient will be independent with a basic home exercise program.  Functional Mobility/Activities:   Patient will be able to ascend/descend steps in a step-to pattern with upper extremity support.   Patient will ambulate with least restrictive device 10 min without increased pain.     Long term goals: 8 weeks  Pain:  Patient will report pain at 6/10 at its worst with ADLs  for 1 week.  HEP:  Patient will be independent with a comprehensive exercise program.  Functional Mobility/Activities:   Patient will be able to ascend/descend steps reciprocally with upper extremity support..  Patient will increase score on the LEFS >/= 10 points indicating functional improvement.       PLAN:  Plan of care: Appropriate for PT  PT interventions: AROM/PROM/Therapeutic exercise, Balance activites, Closed chain activites, Cold, Flexibility, Gait training/Functional activities, General conditioning, Home exercise program instruction, Joint mobilizations, Manual therapy, Patient/Family Education, Postural training/body Curator education, Proprioceptive training, Strengthening  PT frequency:  Twice a week  PT duration: 8 weeks                                                                               *Based on clinical judgement, objective measures and subjective reports      Thank you for the referral.  If you have any questions and/or concerns, please feel free to contact me at [phone].        Trish Mage, PT, DPT

## 2016-07-31 ENCOUNTER — Ambulatory Visit: Payer: Self-pay

## 2016-07-31 DIAGNOSIS — M25561 Pain in right knee: Secondary | ICD-10-CM

## 2016-07-31 NOTE — Progress Notes (Signed)
Physical Therapy Exercise Flowsheet:  *Please refer to Physical Therapy Daily Flowsheet for further details of this session.*     07/31/16 1550   Lumbar/Abdominal Exercises   Lumbar/Abdominal Exercises Yes   Bridges w/ Pillow Squeeze Comment 10x5sec   Single Knee To Chest w/ Straight Leg Raise Comment 10x5sec   Hip Exercises   Hip Abduction, Sidelying Comment 10x5sec   Hip Abduction, Standing, Theraband Comment level 1, 10 reps   Hip Extension, Standing Comment level 1, 10 reps   Knee Exercises   Straight Leg Raise Comment 10x5sec   [name], PTA

## 2016-07-31 NOTE — Progress Notes (Signed)
Physical Therapy Daily Flowsheet:  *Please see Physical Therapy Exercise Flowsheet for details regarding exercises completed this session.*     07/31/16 1550   Overview   Diagnosis post right knee arthroscopic surgery with meniscectomy and chondroplasty.   Insurance ([facility]CD)   Script Date 07/23/16   Visit # 2   Pain Assessment   Pain X   0-10 Scale 6   Pain Location Knee   Modalities   Modalities Yes   Cold Pack Yes   Time 10 min   Patient Education   Patient Education Yes   Educated in disease process Yes   Educated in home exercise program Yes   Educated in proper body mechanics Yes   Educated in proper postural mechanics and management Yes   Time Calculation   PT Timed Codes 0   PT Untimed Codes 30 min   PT Unbilled Time 15 min   PT Total Treatment 30   Charges   CC Charges Therapeutic Exercises - code [phone] (15 minutes x2)   Mendel RyderSherrielyn Anyae Griffith, PTA

## 2016-08-01 ENCOUNTER — Encounter: Payer: Self-pay | Admitting: Orthopedic Surgery

## 2016-08-01 ENCOUNTER — Ambulatory Visit: Payer: Self-pay | Admitting: Orthopedic Surgery

## 2016-08-01 VITALS — BP 138/100 | Ht 68.0 in | Wt 191.0 lb

## 2016-08-01 DIAGNOSIS — M1711 Unilateral primary osteoarthritis, right knee: Secondary | ICD-10-CM

## 2016-08-01 DIAGNOSIS — Z9889 Other specified postprocedural states: Secondary | ICD-10-CM

## 2016-08-01 NOTE — Progress Notes (Signed)
[name]:   Kenner, Jeweline  MR #:  [id number]   ACCOUNT #:  [id number] DOB:  [birth year]   DICTATED BY:  Chaya Jananiel G Kleehammer, PA DATE OF VISIT:  08/01/2016     CHIEF COMPLAINT:  Is now 6 weeks post right knee arthroscopic surgery and meniscectomy and chondroplasty.    INTERVAL HISTORY:  [name] states that she is doing well.  She continues to progress with Physical Therapy.  Occasionally, having some discomfort sleeping at night, but she makes note that her left knee is starting to bother her as well.  She states the incisions have completely healed.    PHYSICAL EXAMINATION:  [name] is a 40 year old woman, alert and oriented x3.  She can fully extend the knee to 0 degrees.  Flexion is to 120.  She has crepitus with range of motion.  Incisions are healed.  Calves are soft and nontender bilaterally.  Distal neurovascular exam intact.  Ligamentous exam is stable.    ASSESSMENT AND PLAN:  [name] is a 40 year old woman, now 6 weeks post right knee arthroscopic surgery.  At this time, I would like her to continue to progress with Physical Therapy.  We did discuss the role of a left knee arthroscopic surgery.  She states that she would like to pursue as was previously discussed by Dr. Idamae LusherGiordano.  She will call to schedule at her convenience.  She will follow up with us in the next 8-12 weeks for her right knee.  If she should have any further questions or concerns, feel free to contact the office.       Dictated By:  Chaya Jananiel G Kleehammer, PA      ______________________________  Arvil PersonsBrian D Samiya Mervin, MD    DGK/MODL  DD:  08/01/2016 10:43:13  DT:  08/01/2016 12:50:02  Job #:  [id number]    cc:

## 2016-08-05 ENCOUNTER — Ambulatory Visit: Payer: Self-pay

## 2016-08-05 DIAGNOSIS — M25561 Pain in right knee: Secondary | ICD-10-CM

## 2016-08-05 NOTE — Progress Notes (Signed)
Physical Therapy Exercise Flowsheet:  *Please refer to Physical Therapy Daily Flowsheet for further details of this session.*     08/05/16 1500   Lumbar/Abdominal Exercises   Lumbar/Abdominal Exercises Yes   Bridges w/ Pillow Squeeze Comment 10x5sec   Single Knee To Chest w/ Straight Leg Raise Comment 10x5sec   Hip Exercises   Hip Abduction, Sidelying Comment 10x5sec   Hip Abduction, Standing, Theraband Comment level 1, 15 reps   Hip Extension, Standing Comment level 1, 15 reps   Knee Exercises   Straight Leg Raise Comment 10x5sec   Mendel RyderSherrielyn Isebella Upshur, PTA

## 2016-08-05 NOTE — Progress Notes (Signed)
Physical Therapy Daily Flowsheet:  *Please see Physical Therapy Exercise Flowsheet for details regarding exercises completed this session.*     08/05/16 1502   Overview   Diagnosis post right knee arthroscopic surgery with meniscectomy and chondroplasty.   Insurance [facility], MCD)   Script Date 07/23/16   Visit # 3   Pain Assessment   Pain X   0-10 Scale 6   Pain Location Knee   Modalities   Modalities No   Patient Education   Patient Education Yes   Educated in disease process Yes   Educated in home exercise program Yes   Educated in proper body mechanics Yes   Educated in proper postural mechanics and management Yes   Time Calculation   PT Timed Codes 0   PT Untimed Codes 29 mn   PT Unbilled Time 0   PT Total Treatment 29   Charges   CC Charges Therapeutic Exercises - code [id number] (15 minutes x2)   [name], PTA

## 2016-08-07 NOTE — Procedures (Signed)
[name] is a 40 y.o. female patient.  No diagnosis found.  Past Medical History:   Diagnosis Date   . Anemia    . Anxiety    . Depression    . GERD (gastroesophageal reflux disease)    . Hypertension    . Scoliosis      Blood pressure (!) 155/87, pulse 86, temperature 36.3 C (97.3 F), temperature source Temporal, resp. rate 16, height 1.727 m (5\' 8" ), weight 89.4 kg (197 lb), last menstrual period 08/02/2016, SpO2 99 %.    Procedures      Procedural Sedation  Consent: Written consent obtained and given by patient, or family member when appropriate.   Risks and benefits: risks, benefits and alternatives were discussed  Time out: Immediately prior to procedure a "time out" was called to verify the correct patient, procedure, equipment, support staff and site/side marked as required.  NPO status: 8 hours  Pre-procedure preparation: bag valve mask available, oxygen available, personal items removed, ride availability confirmed, suction available and patient identification present and verified  Notes reviewed: see notes for documentation of history, review of systems, and physical exam  Mouth opening: good  Neck range of motion: full  Mallampati classification: I  ASA class: ASA 2 - Patient with mild systemic disease with no functional limitations  Monitoring utilized: constant attendance by RN until patient recovered, continuous pulse oximetry, intubation and emergency airway equipment available and frequent vital sign checks  Sedation type: moderate sedation  Medication used: fentanyl and midazolam  Procedural start and stop times are recorded in nursing documentation.  Reversal agent: none  Respiratory status: normal  Duration of procedures (minutes): 19 minutes  Withdrawal Time: 6 minutes  Patient tolerance: Patient tolerated the procedure well with no immediate complications.    Procedure: Outpatient Colonoscopy     Attending Attestation: I performed the procedure.  Assistant: none    Pre-Procedure History &  Physical:  Patient's medications, allergies, past medical, surgical, social and family histories were reviewed and updated as appropriate    Indications: CRC screening, High Risk, Father with CRC in 40's    Sedation: Versed 7 mg IV, fentanyl 100 mcg IV    Instrument Used: Olympus Pediatric Colonoscope    Informed Consent:   A discussion of informed consent was had with the patient and/or the patient's family prior to the procedure, including sedation.  The alternatives, benefits and risks of  The procedure including but not limited to perforation, hemorrhage, infection, adverse drug reaction and aspiration were discussed.     Description of Procedure:  After discussion of informed consent, and appropriate level of sedation were attained, the patient was placed in the left lateral position.  The patient was monitored continuously with pulse oximetry, blood pressure monitoring, and direct observations.      After digital rectal examination, the Pediatric Colonoscope was inserted into the rectum and advanced under direct vision to the level of the cecum, which was identified by the appendiceal orifice.  The quality of the colonic preparation was excellent.  A careful inspection was made as the colonoscope was withdrawn.  Findings and interventions are described below.      Findings: Normal appearing mucosa.  No masses or polyps identified.  Normal rectum on retroflexion.           Complications: No immediate complications were noted.  Estimated Blood loss: none    Impression:  Normal exam.      Recommendations:  1. Repeat exam in five years  2. Resume home medications     Disposition: home in stable condition.       Louann Liv, MD  08/07/2016

## 2016-08-16 ENCOUNTER — Ambulatory Visit: Payer: Self-pay

## 2016-08-22 ENCOUNTER — Ambulatory Visit: Payer: Self-pay

## 2016-08-22 DIAGNOSIS — M25561 Pain in right knee: Secondary | ICD-10-CM

## 2016-08-22 NOTE — Progress Notes (Addendum)
Department of Physical Medicine & Rehabilitation  Physical Therapy Discharge Note    HISTORY:  Diagnosis:  post right knee arthroscopic surgery with meniscectomy and chondroplasty.  Date of Surgery/Onset Date of Symptoms:        06/26/2016  Referring Provider:        Arvil PersonsGiordano, Brian D, MD  Frequency:    Once a week  Attendance:    Consistent  Patient's compliance with therapy and home exercise program:      good   Treatment:    AROM/PROM/Therapeutic exercise, Cold pack, Flexibility, Home exercise program instructions/Patient education, Patient/Family Education, Postural training/body Curatormechanic education, Strengthening    SUBJECTIVE:  Pain:    Improved     0-10 Scale: 2, 3   Pain Location: Knee     OBJECTIVE:  ROM:    ROM   R Knee Flexion (0-140): 125   R Knee Extension (0): 0  Strength:    STRENGTH   L Hip Flexion : 4+/5   L Hip Abduction : 4+/5   L Knee Flexion : 4+/5   L Knee Extension : 4/5, 4+/5   Level 3 Theraband Exercises  Function:    Improved   Functional Tasks:     Improving with: Able to navigate stairs with less difficulty using rails, decreased pain with ambulation ~ 15 minutes      Still difficulty with the following: stair negotiation, squatting, long distance walking              Additional comments: Limited with functions due to left knee pain    Functional Outcome Measures:    Self-Reported: Lower Limb Functional scale:20%         Patient Education   Patient Education: Yes   Educated in disease process: Yes   Educated in home exercise program: Yes   Educated in proper body mechanics: Yes   Educated in proper Radiographer, therapeuticpostural mechanics and management: Yes         Rehab potential/prognosis:good  Patient's understanding:good  ASSESSMENT:  Patient is a 40 yo female who has improved ROM, strength and overall functional mobility. Patient is discharged with majority of goals achieved except with stair navigation due to pain. Patient will continue with independent home exercise program. All  questions and concerns were addressed today.     Progress Towards Previous Goals:    good       Short term goals:achieved  Pain: Patient will report a decrease in their maximal pain level by 3 points. - Achieved  HEP: Patient will be independent with a basic home exercise program. - Achieved  Functional Mobility/Activities: Patient will be able to ascend/descend steps in a step-to pattern with upper extremity support. - partially achieved/ limited due left knee pain  Patient will ambulate with least restrictive device 10 min without increased pain. - Achieved    Long term goals:acheived  Pain: Patient will report pain at 6/10 at its worst with ADLs for 1 week. - Achieved  HEP: Patient will be independent with a comprehensive exercise program. - Achieved  Functional Mobility/Activities: Patient will be able to ascend/descend steps reciprocally with upper extremity support..- partially achieved/ limited due left knee pain  Patient will increase score on the LEFS >/= 10 points indicating functional improvement. - Achieved          PLAN:  Plan of Care:     Discharge PT, all goals achieved and patient independent with a home exercise program    Thank you for the referral.  If you have any questions and/or concerns, please feel free to contact me at [phone].      Sherrielyn Eulis FosterAlberto, PTA  Doloris HallLindsay Gaines Cartmell, PT    Phone # [phone]  Fax # [phone]

## 2016-08-22 NOTE — Progress Notes (Signed)
Physical Therapy Daily Flowsheet:  *Please see Physical Therapy Exercise Flowsheet for details regarding exercises completed this session.*     08/22/16 1600   Overview   Diagnosis 08/15/2016   Insurance [employer]([facility], MCD)   Script Date 07/23/16   Visit # 4   Pain Assessment   Pain X   0-10 Scale 2;3   Pain Location Knee   ROM   R Knee Flexion (0-140) 125   R Knee Extension (0) 0   STRENGTH   L Hip Flexion  4+/5   L Hip Abduction  4+/5   L Knee Flexion  4+/5   L Knee Extension  4/5;4+/5   Modalities   Modalities No   Functional Outcome Measures   Self Reported Functional Measures Yes   Lower Limb Functional Scale Yes   Any of your usual work, housework or school activities 2   Your usual hobbies, recreational or sporting activities 2   Getting into or out of the bath 1   Walking between rooms 3   Putting on your shoes 3   Squatting 0   Lifting an object like a bag of groceries from the floor 2   Performing light activites around your home 3   Performing heavy activites around your home 0   Getting in or out of the car 1   Walking 2 blocks 0   Walking a mile 0   Going up or down 10 stairs (about 1 flight) 0   Standing for 1 hour 0   Sitting for 1 hour 0   Running on even ground 0   Running on uneven ground 0   Making sharp turns while running fast 0   Hopping 0   Rolling over in bed 3   Lower Limb Functional Score 20   Patient Education   Patient Education Yes   Educated in disease process Yes   Educated in home exercise program Yes   Educated in proper body mechanics Yes   Educated in proper postural mechanics and management Yes   Time Calculation   PT Timed Codes 0   PT Untimed Codes 30   PT Unbilled Time 0   PT Total Treatment 30   Plan and Onset date   Plan of Care Date 07/23/16   Onset Date 06/26/16   Treatment Start Date 07/23/16   Charges   CC Charges Therapeutic Exercises - code [id number] (15 minutes x2)   Mendel RyderSherrielyn Kathyleen Radice, PTA

## 2016-08-22 NOTE — Progress Notes (Signed)
Physical Therapy Exercise Flowsheet:  *Please refer to Physical Therapy Daily Flowsheet for further details of this session.*     08/22/16 1600   Lumbar/Abdominal Exercises   Lumbar/Abdominal Exercises Yes   Bridges w/ Pillow Squeeze Comment 10x5sec   Single Knee To Chest w/ Straight Leg Raise Comment 10x5sec   Hip Exercises   Hip Abduction, Sidelying Comment 10x5sce   Hip Abduction, Standing, Theraband Comment level 3, 10x5sec   Hip Extension, Standing Comment level 3, 10x5sce   Knee Exercises   Straight Leg Raise Comment 10x5sec   Mendel RyderSherrielyn Jonatha Gagen, PTA

## 2016-08-27 ENCOUNTER — Encounter: Payer: Self-pay | Admitting: Orthopedic Surgery

## 2016-08-27 ENCOUNTER — Encounter (INDEPENDENT_AMBULATORY_CARE_PROVIDER_SITE_OTHER): Payer: Self-pay

## 2016-09-03 ENCOUNTER — Telehealth: Payer: Self-pay

## 2016-09-03 NOTE — Telephone Encounter (Signed)
Patient is calling to get a copy of her PT attendance sent to her case work Amy B at [phone] attention amy b.

## 2016-09-05 NOTE — OR PreOp (Signed)
Pt given  preop instructions per [facility] Guidelines    Medications for DOS reviewed with pt  - omeprazole and amlodipine      Chart needs review for the following problem:    Pt had surg @ [facility]- 06/2016.  She says she was told her"lungs didn't take well to anesth".  Pt says she is trying to quit smoking this week.    Pt says  she snores very loudly and had a sleep study 2 weeks ago.  She was  told she has "mod OSA" and CPAP ordered.  It has not arrived yet.     Pt had  Epidural block yesterday @ [facility]. Lacretia NicksW. Dr. Winfred Leedshiang

## 2016-09-06 NOTE — Anesthesia Preprocedure Evaluation (Addendum)
Anesthesia Pre-operative History and Physical for [name]    ______________________________________________________________________________________    Summary:  I have reviewed the RN anesthesia screening information and erecords for [name]. The patient is scheduled for  LEFT KNEE ARTHROSCOPY WITH MENISCECTOMY  With Dr Idamae LusherGiordano on 09/11/16.  Pt had previous [facility] surgery 06/26/16 with Dr. Lossie FaesBrian Giordano. T     The patient's medical history is significant for:  - [facility]. Denies CP or SOB. Holter placed and was negative (results in media). She did have several weeks of diarrhea prior that contributed to her dehydration. Did not see cardiology. No s/s since.   - Chronic pain - DJD (degenerative joint disease), lumbar / Arthritis - knee/ Fibromyalgia - [facility] pain clinic, Mobic, percocet daily, Gabapentin at hs and Lyrica. Climbing 1 FOS painful on knees and needs to rest , denies Chest pain or SOB. Completed aqua therapy and can walk to corner store on occasion, no other regular exercise routine.   - Anemia- po iron - H/H stable  - Hypertension - Amlodipine  - +Snores loudly, believes apneic episodes- never been tested                 - Depression / Anxiety - Cymbalta, Remeron, Hydroxyzine   - Tobacco abuse                                                - GERD (gastroesophageal reflux disease) / Peptic ulcer - Omeprazole                 - BMI 28.3              Pulmonary Function Tests:  06/2016 CXR care everywhere   Indication:Chronic right-sided back pain.     Comparison:Thoracic spine radiographs 05/17/16    Technique:Frontal chest radiograph is obtained with 3 additional views of the right ribs.    Chest findings: Cardiomediastinal silhouette is within normal limits. The lung there is minimal atelectasis at the right lung base. Lungs are otherwise clear. There is no pneumothorax or pleural effusion. There is marked scoliosis. Surgical clips  are  seen in the right upper abdomen.    Right rib findings:No fracture or destructive rib lesion is seen.    Impression:   1. No active disease in the chest. Marked scoliosis.  2. No fracture or destructive right rib lesion is seen  This patient is deemed an appropriate candidate for the proposed surgery in an ambulatory setting.  By Lisbeth RenshawIANE WEEZORAK, NP at 11:35 AM on 09/06/2016    <[email or web address]>  Anesthesia Evaluation Information Source: records, patient     ANESTHESIA  Pertinent(-):  history of anesthetic complications, Family Hx of Anesthetic Complications    GENERAL  Pertinent (-):  history of anesthetic complications, Family Hx of Anesthetic Complications     PULMONARY    + Smoker            advised to quit    + Sleep apnea (started CPAP two days ago)          CPAP  Pertinent(-): asthma, recent URI, snoring, COPD    CARDIOVASCULAR  Good(4+METs) Exercise Tolerance    + Hypertension (held amlodipine and lisinopril today. DBP about 100 per patient.)  Pertinent(-):  past MI, angina (sharp stabbing pain for  about five minutes, unrelated to activity, every two months for 20 years.)    GI/HEPATIC/RENAL  Last PO Intake: >8hr before procedure and >2hr before procedure (clears)    + GERD (worse with spicy foods.  Controlled with omeprazole)    + PUD  Pertinent(-):  liver  issues, renal issues NEURO/PSYCH    + Headaches            migraines    + Chronic pain          fibromyalgia, lower back    + Psychiatric Issues          depression, anxiety    + Neuromuscular disease  Pertinent(-):  seizures, cerebrovascular event    ENDO/OTHER  Pertinent(-):  diabetes mellitus, thyroid disease    HEMALOGIC    + Arthritis  Pertinent(-):  bruises/bleeds easily       Physical Exam    Airway            Mouth opening: normal            Mallampati: I            TM distance (fb): >3 FB            Neck ROM: full  Dental   Normal Exam   Cardiovascular           Rhythm: regular           Rate: normal  No murmur       Pulmonary     breath  sounds clear to auscultation    Mental Status     oriented to person, place and time       ________________________________________________________________________  Plan  ASA Score  3  Anesthetic Plan general    Induction (routine IV); General Anesthesia/Sedation Maintenance Plan (inhaled agents); Airway (LMA); Line ( use current access); Monitoring (standard ASA); Positioning (supine); PONV Plan (dexamethasone and ondansetron); Pain (per surgical team); PostOp (PACU)    Informed Consent     Risks:          Risks discussed were commensurate with the plan listed above with the following specific points: N/V, aspiration and sore throat , damage to:(eyes, nerves, teeth), unexpected serious injury, allergic Rx    Anesthetic Consent:      Anesthetic plan (and risks as noted above) were discussed with patient    Plan also discussed with team members including:  CRNA    Attending Attestation:  As the primary attending anesthesiologist, I attest that the patient or proxy understands and accepts the risks and benefits of the anesthesia plan. I also attest that I have personally performed a pre-anesthetic examination and evaluation, and prescribed the anesthetic plan for this particular location within 48 hours prior to the anesthetic as documented. Gwen PoundsSTEWART J Allon Costlow, MD 1:10 PM

## 2016-09-11 ENCOUNTER — Encounter
Admission: RE | Disposition: A | Discharge status: Home / Self Care | Payer: Self-pay | Source: Ambulatory Visit | Attending: Orthopedic Surgery

## 2016-09-11 ENCOUNTER — Ambulatory Visit
Admission: RE | Admit: 2016-09-11 | Discharge: 2016-09-11 | Disposition: A | Discharge status: Home / Self Care | Payer: Self-pay | Source: Ambulatory Visit | Attending: Orthopedic Surgery | Admitting: Orthopedic Surgery

## 2016-09-11 ENCOUNTER — Encounter: Payer: Self-pay | Admitting: Anesthesiology

## 2016-09-11 DIAGNOSIS — S83282A Other tear of lateral meniscus, current injury, left knee, initial encounter: Secondary | ICD-10-CM

## 2016-09-11 HISTORY — PX: PR ARTHRS KNE SURG W/MENISCECTOMY MED/LAT W/SHVG: 29881

## 2016-09-11 LAB — POCT URINE PREGNANCY: Lot #: 171151

## 2016-09-11 SURGERY — ARTHROSCOPY, KNEE, WITH MENISCECTOMY
Anesthesia: General | Site: Knee | Laterality: Left | Wound class: Clean

## 2016-09-11 MED ORDER — ONDANSETRON HCL 2 MG/ML IV SOLN *I*
1.0000 mg | Freq: Once | INTRAMUSCULAR | Status: AC | PRN
Start: 2016-09-11 — End: 2016-09-11

## 2016-09-11 MED ORDER — OXYCODONE HCL 5 MG/5ML PO SOLN *I*
ORAL | Status: AC
Start: 2016-09-11 — End: 2016-09-11
  Filled 2016-09-11: qty 10

## 2016-09-11 MED ORDER — KETOROLAC TROMETHAMINE 30 MG/ML IJ SOLN *I*
INTRAMUSCULAR | Status: DC | PRN
Start: 2016-09-11 — End: 2016-09-11
  Administered 2016-09-11: 30 mg via INTRAVENOUS

## 2016-09-11 MED ORDER — OXYCODONE HCL 5 MG/5ML PO SOLN *I*
5.0000 mg | Freq: Once | ORAL | Status: AC | PRN
Start: 2016-09-11 — End: 2016-09-11

## 2016-09-11 MED ORDER — BACITRACIN-POLYMYXIN B 500-10000 UNIT/GM EX OINT *I*
TOPICAL_OINTMENT | CUTANEOUS | Status: DC | PRN
Start: 2016-09-11 — End: 2016-09-11
  Administered 2016-09-11: 1 via TOPICAL

## 2016-09-11 MED ORDER — FENTANYL CITRATE 50 MCG/ML IJ SOLN *WRAPPED*
INTRAMUSCULAR | Status: DC | PRN
Start: 2016-09-11 — End: 2016-09-11
  Administered 2016-09-11 (×4): 50 ug via INTRAVENOUS

## 2016-09-11 MED ORDER — DIPHENHYDRAMINE HCL 50 MG/ML IJ SOLN *I*
INTRAMUSCULAR | Status: AC
Start: 2016-09-11 — End: 2016-09-11
  Filled 2016-09-11: qty 1

## 2016-09-11 MED ORDER — FENTANYL CITRATE 50 MCG/ML IJ SOLN *WRAPPED*
INTRAMUSCULAR | Status: AC
Start: 2016-09-11 — End: 2016-09-11
  Filled 2016-09-11: qty 2

## 2016-09-11 MED ORDER — MIDAZOLAM HCL 1 MG/ML IJ SOLN *I* WRAPPED
INTRAMUSCULAR | Status: DC | PRN
Start: 2016-09-11 — End: 2016-09-11
  Administered 2016-09-11: 2 mg via INTRAVENOUS

## 2016-09-11 MED ORDER — HALOPERIDOL LACTATE 5 MG/ML IJ SOLN *I*
1.0000 mg | Freq: Once | INTRAMUSCULAR | Status: AC | PRN
Start: 2016-09-11 — End: 2016-09-11

## 2016-09-11 MED ORDER — MIDAZOLAM HCL 1 MG/ML IJ SOLN *I* WRAPPED
INTRAMUSCULAR | Status: AC
Start: 2016-09-11 — End: 2016-09-11
  Filled 2016-09-11: qty 2

## 2016-09-11 MED ORDER — HYDROMORPHONE HCL PF 1 MG/ML IJ SOLN *WRAPPED*
INTRAMUSCULAR | Status: AC
Start: 2016-09-11 — End: 2016-09-11
  Filled 2016-09-11: qty 0.5

## 2016-09-11 MED ORDER — EPINEPHRINE 1 MG/ML IJ SOLUTION WRAPPED *I*
INTRAMUSCULAR | Status: DC | PRN
Start: 2016-09-11 — End: 2016-09-11
  Administered 2016-09-11: 2 mg

## 2016-09-11 MED ORDER — CEFAZOLIN 1000 MG IN STERILE WATER 10ML SYRINGE *I*
PREFILLED_SYRINGE | INTRAVENOUS | Status: AC
Start: 2016-09-11 — End: 2016-09-11
  Filled 2016-09-11: qty 20

## 2016-09-11 MED ORDER — LIDOCAINE HCL 1 % IJ SOLN *I*
0.1000 mL | Freq: Once | INTRAMUSCULAR | Status: AC | PRN
Start: 2016-09-11 — End: 2016-09-11

## 2016-09-11 MED ORDER — PROMETHAZINE HCL 25 MG/ML IJ SOLN *I*
6.2500 mg | INTRAMUSCULAR | Status: DC | PRN
Start: 2016-09-11 — End: 2016-09-12

## 2016-09-11 MED ORDER — ACETAMINOPHEN 80 MG PO TBDP *I*
960.0000 mg | ORAL_TABLET | Freq: Once | ORAL | Status: AC
Start: 2016-09-11 — End: 2016-09-11

## 2016-09-11 MED ORDER — ONDANSETRON HCL 2 MG/ML IV SOLN *I*
INTRAMUSCULAR | Status: DC | PRN
Start: 2016-09-11 — End: 2016-09-11
  Administered 2016-09-11: 4 mg via INTRAVENOUS

## 2016-09-11 MED ORDER — HYDROMORPHONE HCL PF 1 MG/ML IJ SOLN *WRAPPED*
0.5000 mg | INTRAMUSCULAR | Status: DC | PRN
Start: 2016-09-11 — End: 2016-09-12
  Administered 2016-09-11 (×2): 0.5 mg via INTRAVENOUS

## 2016-09-11 MED ORDER — OXYCODONE-ACETAMINOPHEN 5-325 MG PO TABS *I*
1.0000 | ORAL_TABLET | ORAL | 0 refills | Status: DC | PRN
Start: 2016-09-11 — End: 2016-09-17

## 2016-09-11 MED ORDER — PROPOFOL 10 MG/ML IV EMUL (INTERMITTENT DOSING) WRAPPED *I*
INTRAVENOUS | Status: DC | PRN
Start: 2016-09-11 — End: 2016-09-11
  Administered 2016-09-11: 200 mg via INTRAVENOUS

## 2016-09-11 MED ORDER — BUPIVACAINE-EPINEPHRINE 0.25 % IJ SOLUTION *WRAPPED*
INTRAMUSCULAR | Status: DC | PRN
Start: 2016-09-11 — End: 2016-09-11
  Administered 2016-09-11: 11 mL via SUBCUTANEOUS
  Administered 2016-09-11: 19 mL via SUBCUTANEOUS

## 2016-09-11 MED ORDER — LIDOCAINE HCL 2 % IJ SOLN *I*
INTRAMUSCULAR | Status: DC | PRN
Start: 2016-09-11 — End: 2016-09-11
  Administered 2016-09-11: 80 mg via INTRAVENOUS

## 2016-09-11 MED ORDER — ACETAMINOPHEN 325 MG PO TABS *I*
975.0000 mg | ORAL_TABLET | Freq: Once | ORAL | Status: AC
Start: 2016-09-11 — End: 2016-09-11
  Administered 2016-09-11: 975 mg via ORAL

## 2016-09-11 MED ORDER — OXYCODONE HCL 5 MG/5ML PO SOLN *I*
10.0000 mg | Freq: Once | ORAL | Status: AC | PRN
Start: 2016-09-11 — End: 2016-09-11
  Administered 2016-09-11: 10 mg via ORAL

## 2016-09-11 MED ORDER — HYDROMORPHONE HCL PF 1 MG/ML IJ SOLN *WRAPPED*
INTRAMUSCULAR | Status: DC | PRN
Start: 2016-09-11 — End: 2016-09-11
  Administered 2016-09-11: 0.5 mg via INTRAVENOUS

## 2016-09-11 MED ORDER — CEFAZOLIN 1000 MG IN STERILE WATER 10ML SYRINGE *I*
2000.0000 mg | PREFILLED_SYRINGE | INTRAVENOUS | Status: AC
Start: 2016-09-11 — End: 2016-09-11
  Administered 2016-09-11: 2000 mg via INTRAVENOUS

## 2016-09-11 MED ORDER — DEXAMETHASONE SODIUM PHOSPHATE 4 MG/ML INJ SOLN *WRAPPED*
INTRAMUSCULAR | Status: DC | PRN
Start: 2016-09-11 — End: 2016-09-11
  Administered 2016-09-11: 4 mg via INTRAVENOUS

## 2016-09-11 MED ORDER — LACTATED RINGERS IV SOLN *I*
20.0000 mL/h | INTRAVENOUS | Status: DC
Start: 2016-09-11 — End: 2016-09-12

## 2016-09-11 MED ORDER — ACETAMINOPHEN 325 MG PO TABS *I*
ORAL_TABLET | ORAL | Status: AC
Start: 2016-09-11 — End: 2016-09-11
  Filled 2016-09-11: qty 3

## 2016-09-11 MED ORDER — LIDOCAINE HCL 1 % IJ SOLN *I*
INTRAMUSCULAR | Status: AC
Start: 2016-09-11 — End: 2016-09-11
  Filled 2016-09-11: qty 2

## 2016-09-11 MED ORDER — ACETAMINOPHEN 160 MG/5 ML WRAPPED *I*
975.0000 mg | Freq: Once | Status: AC
Start: 2016-09-11 — End: 2016-09-11

## 2016-09-11 SURGICAL SUPPLY — 18 items
BANDAGE ELAST SLF-CLSR 6X11 LF NONSTER (Dressing) ×2 IMPLANT
BLADE GREAT WHITE 4.2MM (Supply) IMPLANT
BLADE GREAT WHITE LG 5.5MM (Supply) IMPLANT
BLADE GREAT WHITE PREBENT 4.2MM (Supply) IMPLANT
BLADE TORPEDO 4MM X 13CM (Other) ×2 IMPLANT
BOOT KNEE HIGH NON SKID (Supply) ×2 IMPLANT
CUTTER SUTR KNOT PUSHER SLOTTED CAN SET (Other) IMPLANT
DRAPE SHEET 70X100 (Drape) ×1
DRAPE SUR W70XL100IN STD SMS POLYPR FULL SHT W/O FLD PCH DISP (Drape) ×1 IMPLANT
DRESSING CURITY NONADHERE 3X3 (Dressing) ×2 IMPLANT
ELECTRODE VAPR PREMIER 50DEG 3 X 3.65MM (Supply) ×2 IMPLANT
GLOVE SURG PROTEXIS PI CLASSIC 8.0 PF SYN (Glove) ×12 IMPLANT
GOWN SIRIUS RAGLAN NONREINFORCED XL (Gown) ×4 IMPLANT
PACK CUSTOM ARTHROSCOPY KNEE CDS (Pack) ×2 IMPLANT
SET TBNG GRAVITY FOUR-SPIKE (Supply) ×2 IMPLANT
SOL LACT RINGER IRRIG 3000ML BAG (Drug) ×4 IMPLANT
SUTR ETHILON MONO 3-0 PS-2 BLACK (Suture) ×2 IMPLANT
TUBING MEDI-VAC NONCONDUC 12FT X 3/16IN (Tubing) ×2 IMPLANT

## 2016-09-11 NOTE — Anesthesia Postprocedure Evaluation (Signed)
Anesthesia Post-Op Note    Patient: [name]    Procedure(s) Performed:  Procedure Summary     Date Anesthesia Start Anesthesia Stop Room / Location    09/11/16 1135 [id number] [id number] [facility]       Procedure Diagnosis Surgeon Attending Anesthesia    LEFT KNEE ARTHROSCOPY WITH PARTIAL LATERAL MENISCECTOMY, CHONDROPLASTY     (Left Knee) Acute pain of left knee  (Acute pain of left knee [M25.562]) Arvil PersonsGiordano, Brian D, MD Gwen PoundsLustik, Glori Machnik J, MD        Recovery Vitals  BP: 140/95 (09/11/2016  1:00 PM)  Heart Rate: 85 (09/11/2016 10:34 AM)  Heart Rate (via Pulse Ox): 102 (09/11/2016  1:00 PM)  Resp: 16 (09/11/2016 12:30 PM)  Temp: 36.2 C (97.2 F) (09/11/2016 12:30 PM)  SpO2: 100 % (09/11/2016  1:00 PM)  O2 Flow Rate: 2 L/min (09/11/2016 12:30 PM)   0-10 Scale: 8 (09/11/2016  1:00 PM)  Anesthesia type:  General  Complications Noted During Procedure or in PACU:  None   Comment:    Patient Location:  PACU  Level of Consciousness:    Awake  Patient Participation:     Able to participate  Temperature Status:    Normothermic  Oxygen Saturation:    Within patient's normal range  Cardiac Status:   Stable  Fluid Status:    Stable  Airway Patency:     Yes  Pulmonary Status:    Baseline  Pain Management:    Adequate analgesia  Nausea and Vomiting:  None  Comments:       Post Op Assessment:    Tolerated procedure well and complications noted below:   Attending Attestation:  All indicated post anesthesia care provided     -

## 2016-09-11 NOTE — Anesthesia Procedure Notes (Signed)
---------------------------------------------------------------------------------------------------------------------------------------    AIRWAY   GENERAL INFORMATION AND STAFF    Patient location during procedure: OR       Date of Procedure: 09/11/2016 12:05 PM  CONDITION PRIOR TO MANIPULATION     Current Airway/Neck Condition:  Normal        For more airway physical exam details, see Anesthesia PreOp Evaluation  AIRWAY METHOD     Patient Position:  Sniffing    Preoxygenated: yes      Induction: IV and Routine  Mask Difficulty Assessment:  0 - not attempted    Number of Attempts at Approach:  1    Number of Other Approaches Attempted:  0  FINAL AIRWAY DETAILS    Final Airway Type:  LMA    Adjunct Airway: soft bite block    Final LMA: Unique    LMA Size: 4  ----------------------------------------------------------------------------------------------------------------------------------------

## 2016-09-11 NOTE — INTERIM OP NOTE (Signed)
S/p L knee arthroscopy, PLM    Interim Op Note (Surgical Log ID: [id number])       Date of Surgery: 09/11/2016       Surgeons: Moishe SpiceSurgeon(s) and Role:     * Arvil PersonsGiordano, Brian D, MD - Primary     * Ozzie Knobel, Wallene DalesWajeeh Rahim, MD - Resident - Assisting       Pre-op Diagnosis: Pre-Op Diagnosis Codes:     * Acute pain of left knee [M25.562]       Post-op Diagnosis: Post-Op Diagnosis Codes:     * Acute pain of left knee [M25.562]       Procedure(s) Performed: Procedure:    LEFT KNEE ARTHROSCOPY WITH PARTIAL LATERAL MENISCECTOMY, CHONDROPLASTY      CPT(R) Code:  1610929881 - PR ARTHRS KNE SURG W/MENISCECTOMY MED/LAT W/SH[facility]G         Additional CPT Codes:        Anesthesia Type: General        Fluid Totals:         Estimated Blood Loss: No Data Recorded       Specimens to Pathology:  * No specimens in log *       Temporary Implants:        Packing:                 Patient Condition: good       Findings (Including unexpected complications): No complications     Signed:  Mabeline CarasWajeeh Rahim Telissa Palmisano, MD  on 09/11/2016 at 12:30 PM

## 2016-09-11 NOTE — Anesthesia Case Conclusion (Signed)
CASE CONCLUSION  Emergence  Actions:  Suctioned, soft bite block and LMA removed  Criteria Used for Airway Removal:  Adequate Tv & RR, acceptable O2 saturation and following commands  Assessment:  Routine  Transport  Directly to: PACU  Airway:  Nasal cannula  Oxygen Delivery:  4 lpm  Monitoring:  Pulse oximetry  Position:  Upright  Patient Condition on Handoff  Level of Consciousness:  Alert/talking/calm  Patient Condition:  Stable  Handoff Report to:  RN

## 2016-09-11 NOTE — Op Note (Signed)
Patient: [name]  MRN: [id number]   DOS: 09/11/2016     Surgeon: Arlys JohnBrian D. Idamae LusherGiordano MD    First Assistant: Susy FrizzleWajeeh Baksh MD    Preoperative Diagnosis:    Left knee symptomatic lateral meniscus tear  patellofemoral and trochlear chondromalacia  Synovitis  Intraarticular loose body/bodies    Postoperative Diagnosis:   Same    Procedure:  Left knee diagnostic and operative arthroscopy  Partial lateral meniscectomy 20%  Synovectomy infrapatellar, intraarticular notch, medial compartment, lateral compartment and patellofemoral compartment  Chondroplasty patellofemoral and lateral compartment  Arthroscopic loose body removal    Anesthesia Type: General with LMA.     Estimated Blood Loss: Minimal    Fluid Intake: Per anesthesia record    Urine Output: None    Specimens: None    Tourniquet time: None    Implants: None    Patient Condition at Termination of Procedure: Stable to PACU    Complications: None    Intraoperative Findings:   Synovium diffusely hyperemic and injected with frond-like projections  Infrapatellar synovial fat pad thickened and hypertrophic with evidence of impingement on terminal extension  Medial meniscus Normal  Lateral meniscus Root tear posterior  ACL intact and taut; PCL intact and taut  Articular cartilage Patella Grade III 40% with delamination  Trochlea Grade IV 40% with delamination  Medial Compartment Grade I 30% without delamination  Lateral Compartment Grade II 20% without delamination  Patella tracking normal    Procedure:    The patient was identified in the pre-anesthesia holding area and the left knee was marked by the attending surgeon as the corrective operative site. Pre-operative antibiotics were administered within 30 minutes on call to the operating room. The patient was then brought to the operating room and placed supine on the operating table. All bony prominences were well padded.     The patient underwent induction of general LMA anesthesia. The operative extremity was then  prepped and draped in the usual sterile manner for the procedure. A surgical pause was undertaken, identifying the left knee as the correct surgical site. A well padded stress post was raised. Proposed arthroscopy portal sites were injected with 2-3 cc of 1% Lidocaine to further anesthetize the portal tracts. An anterolateral portal was established in the usual fashion. Diagnostic arthroscopy commenced with findings noted above. Under the guidance of an 18 gauge spinal needle, an anteromedial portal was established just superior to the medial meniscus.     A 30 degree arthroscope was brought into the patellofemoral compartment. The patellar articular cartilage was delaminated, A full radius shaver was introduced into the patellofemoral compartment via the anteromedial portal and chondroplasty of the patellar articular cartilage was carried out, stabilizing the peripheral border where chondral delamination was present; microfracture was not performed. The trochlear articular cartilage was delaminated, A full radius shaver was introduced into the patellofemoral compartment via the anteromedial portal and chondroplasty of the trochlear cartilage was carried out, stabilizing the peripheral border where chondral delamination was present microfracture was not peformed Synovium within the patellofemoral space was hyperemic. A shaver was brought into the patellofemoral space and synovectomy was completed, removing hyperemic and injected portions of synovium. Medial and lateral gutter spaces were inspected and loose bodies were present medially. These were removed in their entirety with an arthroscopic grasper. This represented a chondral loose body, measuring 8mm x 8mm.     Patellar tracking was assessed with the knee flexed and extended. The patella was noted to track centrally.  Next, the knee was flexed and the arthroscope was brought into the medial compartment. Synovectomy of friable and hypertrophic synovium was  completed. The operative extremity was brought into a valgus position using a well padded valgus stress post. The medial meniscus was carefully inspected and probed. The medial compartment articular cartilage demonstrated delamination. A full radius shaver was introduced into the medial compartment via the anteromedial portal and chondroplasty of delaminated articular cartilage was carried out, stabilizing the peripheral border where chondral delamination was present microfracture was not performed.    The arthroscope was then brought into the intraarticular notch space. The infrapatella fat pad and synovium were hypertrophic and demonstrated impingement in terminal extension. The infrapatellar fat pad and synovium were debrided. The ACL and PCL demonstrated attrition and attenuation but was overall intact.     Next, the knee was brought to a figure-of-4 position, allowing exposure of the lateral compartment and lateral meniscus. Synovectomy of friable and hypertrophic synovium was completed. The popliteus was visualized and was found to be intact. The lateral meniscus was carefully inspected and probed. The meniscus demonstrated tearing at the posterior horn. The tear type was mixed type. The tear was deemed irreparable based on appearance and stability. Using a combination of shaving and biting instruments, the unstable margin of the meniscus was trimmed back to a stable contour. Further probing demonstrated that the periphery of the meniscus. The lateral compartment articular cartilage demonstrated delamination. A full radius shaver was introduced into the medial compartment via the anteromedial portal and chondroplasty of delaminated articular cartilage was carried out, stabilizing the peripheral border where chondral delamination was present microfracture was not performed.    After completion of all necessary meniscal, chondral, and synovial work, meticulous hemostasis was obtained and the knee was finally  evacuated of all residual soft tissue debris. Incisions were copiously irrigated and closed with 3-0 Nylon sutures. Sterile dressings were applied and overwrapped with an Ace wrap. The patient was awoken from anesthesia in stable condition. All sharps and counts were correct at the conclusion of the case. No intraoperative complications were noted. I was present and scrubbed for all portions of the procedure.

## 2016-09-11 NOTE — H&P (Signed)
UPDATES TO PATIENT'S CONDITION on the DAY OF SURGERY/PROCEDURE    I. Updates to Patient's Condition (to be completed by a provider privileged to complete a H&P, following reassessment of the patient by the provider):    Day of Surgery/Procedure Update:  History  History reviewed and no change    Physical  Physical exam updated and no change            II. Procedure Readiness   I have reviewed the patient's H&P and updated condition. By completing and signing this form, I attest that this patient is ready for surgery/procedure.    III. Attestation   I have reviewed the updated information regarding the patient's condition and it is appropriate to proceed with the planned surgery/procedure.    Arvil PersonsBrian D Justinian Miano, MD as of 10:17 AM 09/11/2016

## 2016-09-11 NOTE — Discharge Instructions (Signed)
Due to the sedation medication and or general anesthesia you have been given today, please follow these discharge instructions:    [x]  Do not drive or operate any machinery for 24 hours or the specified time frame that was recommended by your doctor, please refer to post op instructions.  [x]  Do not drink any alcoholic beverages for 24 hours after your procedure and/or if you are taking a narcotic pain reliever (e.g. Vicodin, Percocet or Tylenol #3).  [x]  Do not make any major decisions or sign contracts for 24 hours.  []  Prescription information provided from [facility].   [x]  Prescription information given to patient and/or patient representative, prescription not filled at [facility].    Diet: begin with liquids, advance as tolerated.    Your last pain medication was given to you at:     Your next dose of pain medication is due after:     If unable to reach your doctor at [phone], call 911 for a true emergency or go to your nearest emergency department.    It is our recommendation that you have someone stay with you for 24 hours following your procedure.                 Knee Arthroscopy   Lossie FaesBrian Giordano, MD         [phone]          [phone]                 Phone:  [facility]   [address] [place], [place] [id number]    DISCHARGE/POST-OPERATIVE INSTRUCTIONS: KNEE ARTHROSCOPY    ON THE DAY OF YOUR SURGERY   You must be accompanied by a responsible adult upon discharge and for 24 hours after surgery.   Be aware of dizziness, which may cause you to fall. Change positions slowly.   Do not make any important decisions for 24 hours due to the potential lingering effects of anesthesia.    ACTIVITY   You may bear weight as tolerated, but use crutches for the first 48 hours for additional support and stability while walking. Physical therapy will help you wean off crutches and return to your desired activities.   Wiggle the toes of your surgical extremity, squeeze/contract your  quadriceps muscle, and pump your foot/ankle up and down at least 10 times/hour and whenever you think about it. This will promote circulation within your operative extremity.    DIET   Begin with clear liquids and advance to your regular diet as tolerated.    DRESSINGS   Keep your dressings clean and dry. They may be removed 2-3 days after your surgery. At this time, apply Band-Aids to your incisions until your stitches are removed, 7-10 days after surgery by your surgeon or physical therapist.   You can get your surgical incisions wet in the shower after dressings are removed. **Do not submerge your operative knee in a bath, pool, or Jacuzzi for at least 2 weeks after surgery.   After removing your dressings, rewrap your ACE wrap around the knee.    ICE   Use a well-sealed bag of ice over your knee intermittently. A good general rule is to ice your operative area for 20 minutes at a time on and off.   Ice your knee after therapy and as long as pain and swelling persist.    NAUSEA/VOMITING   Nausea/vomiting may occur after surgery due to lingering effects of your anesthetic or pain medications. These effects  should be short lived. If you feel nauseous on returning home, decrease activities and return to a liquid diet. If the problem persists, call your surgeon.    MEDICATION     You should not drive, operate machinery/power tools, or drink alcoholic beverages while you are taking prescription narcotic pain medication.   Do not take additional Tylenol (Acetaminophen) with narcotic pain medication.   Ibuprofen (Alleve, Motrin) may be taken for added pain control.   Take pain medication with food as it may cause nausea/upset stomach.   Pain medication may cause constipation in which case over the counter stool   softeners are usually effective.    DRIVING   For surgery involving the right knee or if you drive a standard transmission, you may drive AFTER you are off crutches and all narcotic pain  medications.   For surgery involving your left knee, you may drive when you are comfortable and off all narcotic pain medication.  CALL YOUR DOCTOR FOR ANY OF THE FOLLOWING   Fevers > 101   Redness, warmth, or persistent drainage from the surgical incision   Pain that does not improve with pain medication   Persistent nausea or vomiting into the next day.   Bleeding or continuous oozing from your surgical incisions   Increased swelling in your toes or severe tightness within your operative leg (not improved by loosening your ACE wrap or elevating your limb above your heart)   Pale, blue, or cold toe nail beds (compared with your non-operative leg)   Notify your surgeon if you have not urinated within 12 hours after discharge.    FOLLOWUP CARE   Your first postoperative (7-10 days after surgery) visit will be arranged when   you call to schedule surgery. If this was not arranged preoperatively, please   call immediately following surgery to be seen within 7-10 days from the day   of your surgery.   Physical Therapy will begin 2-3 days following surgery.   If you have any questions regarding your postoperative care, please call Dr.   Maryjo [place] at [phone]   If you need to change your appointment, please call [phone]  I have been instructed in and received a copy of the discharge instructions. I have  had the opportunity to ask questions regarding my postoperative care and my  questions have been answered satisfactorily.

## 2016-09-11 NOTE — Progress Notes (Signed)
[facility]  Peri-Operative Care Note      Patient is seen immediate post-op left knee surgery - arthroscopy performed 09/11/2016.    Pain Assessment  Pain control measures reviewed, including appropriate use of ice and medications as prescribed. Patient is to call MD in the event of uncontrolled pain, nausea/vomiting or fever or with any additional concerns.    Joint Protection and Education  Proper don/doff of brace instructed pre-operatively, reviewed/confirmed post-operatively.   Joint protection concepts for ADLs, dressing, sleeping and self-care instructed pre-operatively; reviewed/confirmed post-operatively.     Immediate Post-Operative Exercises  Patient was instructed to perform exercises as follows per post-operative protocol:   Foot/ankle- Ankle pumps, begin today    Knee- Quad sets, begin today    Knee- PROM - flex, ext, begin tomorrow, as tolerated    Activity Counseling  Touch down Weight-Bearing - left lower extremity for 48 hours, then progress as tolerated.  Patient was educated on crutch ambulation including stairs.  Patient was advised to avoid stairs for the first 24-48 hours.  Patient was instructed not to drive.    Goals:  1. Pt independent with joint protection concepts for ADLs  2. Pt independent with joint ROM/muscular exercises as instructed and appropriate    Patient/family provided contact information for additional questions/concerns/follow-up care.  Patient was scheduled for therapy with [facility].  All questions were invited and answered to their satisfaction.    Myrna BlazerSheryl Mayme Profeta, ATC

## 2016-09-16 ENCOUNTER — Ambulatory Visit: Payer: Self-pay

## 2016-09-16 ENCOUNTER — Encounter: Payer: Self-pay | Admitting: Orthopedic Surgery

## 2016-09-17 ENCOUNTER — Telehealth: Payer: Self-pay

## 2016-09-17 DIAGNOSIS — M25569 Pain in unspecified knee: Secondary | ICD-10-CM

## 2016-09-17 MED ORDER — OXYCODONE-ACETAMINOPHEN 5-325 MG PO TABS *I*
1.0000 | ORAL_TABLET | Freq: Four times a day (QID) | ORAL | 0 refills | Status: DC | PRN
Start: 2016-09-17 — End: 2021-12-17

## 2016-09-17 NOTE — Telephone Encounter (Signed)
11/28:  [name]- [name] calls the office to request a refill of her pain meds.  She is scheduled to be seen tomorrow for post op. She has 2 tabs left  Pharmacy is    [id number] [place] - [address] [phone] (Phone)  [phone] (Fax)     [phone]

## 2016-09-17 NOTE — Telephone Encounter (Signed)
New script sent for oxycodone 5-325 mg 1 tab every 6 hrs prn pain #20  due to post-op pain. We will see her tomorrow for her normal post-op appointment.

## 2016-09-18 ENCOUNTER — Ambulatory Visit: Payer: Self-pay | Admitting: Orthopedic Surgery

## 2016-09-18 ENCOUNTER — Encounter: Payer: Self-pay | Admitting: Orthopedic Surgery

## 2016-09-18 VITALS — BP 154/93 | HR 86 | Ht 68.0 in | Wt 199.0 lb

## 2016-09-18 DIAGNOSIS — Z9889 Other specified postprocedural states: Secondary | ICD-10-CM

## 2016-09-18 NOTE — Progress Notes (Signed)
[name] [name]:   [name] [name]  MR #:  [id number]   ACCOUNT #:  [id number] DOB:  1976-01-21   DICTATED BY:  Chaya Jananiel G Kleehammer, PA DATE OF VISIT:  09/18/2016     CHIEF COMPLAINT:  One week post left knee arthroscopic surgery with lateral meniscectomy and chondroplasty.    INTERVAL HISTORY:  [name] [name] states that she is doing well, but feels as though she might be overdoing at this time.  She has noticed a little bit of swelling.  She states the incisions have healed.  She starts physical therapy next week.  She cannot take NSAIDs due to a history of gastric ulcer.  She is taking oxycodone for her pain and a refill was sent over to the pharmacy for her today.    PHYSICAL EXAMINATION:  [name] [name] is a 40 year old woman, alert and oriented x3, pleasant mood and affect, appears in no acute distress.  Skin is cool and dry to touch.  Incisions are intact.  No signs of infection.  She can fully extend the knee to within just a few degrees of terminal extension.  Flexion is to 120.  No pain to palpation throughout the knee.  Ligamentous exam is stable.  Calves are soft and nontender bilaterally.  Distal neurovascular exam intact.    ASSESSMENT AND PLAN:  [name] [name] is a 40 year old female, 1 week post left knee arthroscopic surgery.  At this time, I would like her to continue to progress with physical therapy.  She will follow up with us in 4-6 weeks.  If she should have any further questions or concerns, feel free to contact the office.  Questions were invited and answered to the patient's satisfaction.  I did discuss with her that she should try to elevate this and ice the knee since she cannot take an anti-inflammatory to help reduce the inflammation, and refrain from overdoing her activities at this time.       Dictated By:  Chaya Jananiel G Kleehammer, PA      ______________________________  Arvil PersonsBrian D Keyonna [place], MD    DGK/MODL  DD:  09/18/2016 14:20:06  DT:  09/18/2016 14:27:52  Job #:  [id number]    cc:

## 2016-09-27 ENCOUNTER — Ambulatory Visit: Payer: Self-pay | Admitting: Orthopedic Surgery

## 2016-10-04 ENCOUNTER — Ambulatory Visit: Payer: Self-pay

## 2016-10-07 ENCOUNTER — Encounter: Payer: Self-pay | Admitting: Orthopedic Surgery

## 2016-10-09 ENCOUNTER — Encounter: Payer: Self-pay | Admitting: Orthopedic Surgery

## 2016-10-30 ENCOUNTER — Ambulatory Visit: Payer: Self-pay | Admitting: Orthopedic Surgery

## 2016-11-15 ENCOUNTER — Encounter: Payer: Self-pay | Admitting: Orthopedic Surgery

## 2017-01-15 ENCOUNTER — Other Ambulatory Visit
Admission: RE | Admit: 2017-01-15 | Discharge: 2017-01-15 | Disposition: A | Discharge status: Home / Self Care | Payer: Medicaid (Managed Care) | Source: Ambulatory Visit | Attending: Emergency Medicine | Admitting: Emergency Medicine

## 2017-01-15 DIAGNOSIS — G894 Chronic pain syndrome: Secondary | ICD-10-CM | POA: Insufficient documentation

## 2017-01-16 LAB — PAIN CLINIC PROFILE
Amphetamine,UR: NEGATIVE
Benzodiazepinen,UR: NEGATIVE
Cocaine/Metab,UR: NEGATIVE
Opiates,UR: POSITIVE
Oxycodone/Oxymorphone,UR: POSITIVE
THC Metabolite,UR: POSITIVE

## 2017-01-20 ENCOUNTER — Other Ambulatory Visit
Admission: RE | Admit: 2017-01-20 | Discharge: 2017-01-20 | Disposition: A | Discharge status: Home / Self Care | Payer: Medicaid (Managed Care) | Source: Ambulatory Visit

## 2017-01-20 ENCOUNTER — Other Ambulatory Visit
Admission: RE | Admit: 2017-01-20 | Discharge: 2017-01-20 | Disposition: A | Discharge status: Home / Self Care | Payer: Medicaid (Managed Care) | Source: Ambulatory Visit | Attending: Family Medicine | Admitting: Family Medicine

## 2017-01-20 DIAGNOSIS — Z124 Encounter for screening for malignant neoplasm of cervix: Secondary | ICD-10-CM | POA: Insufficient documentation

## 2017-01-20 DIAGNOSIS — Z118 Encounter for screening for other infectious and parasitic diseases: Secondary | ICD-10-CM | POA: Insufficient documentation

## 2017-01-20 DIAGNOSIS — Z113 Encounter for screening for infections with a predominantly sexual mode of transmission: Secondary | ICD-10-CM | POA: Insufficient documentation

## 2017-01-23 LAB — GYN CYTOLOGY

## 2017-01-23 LAB — N. GONORRHOEAE DNA AMPLIFICATION: N. gonorrhoeae DNA Amplification: 0

## 2017-01-23 LAB — HPV DNA PROBE WITH CYTOLOGY
HPV Other High Risk: NEGATIVE
HPV Type 16: NEGATIVE
HPV Type 18: NEGATIVE

## 2017-01-23 LAB — TRICHOMONAS DNA AMPLIFICATION: Trichomonas DNA amplification: 0

## 2017-01-23 LAB — CHLAMYDIA PLASMID DNA AMPLIFICATION: Chlamydia Plasmid DNA Amplification: 0

## 2017-02-03 ENCOUNTER — Other Ambulatory Visit: Payer: Self-pay | Admitting: Psychiatry

## 2017-04-27 ENCOUNTER — Observation Stay
Admission: EM | Admit: 2017-04-27 | Discharge: 2017-04-28 | Disposition: A | Discharge status: Home / Self Care | Payer: Medicaid (Managed Care) | Source: Ambulatory Visit | Attending: Emergency Medicine | Admitting: Emergency Medicine

## 2017-04-27 ENCOUNTER — Observation Stay: Payer: Medicaid (Managed Care)

## 2017-04-27 DIAGNOSIS — R1033 Periumbilical pain: Secondary | ICD-10-CM

## 2017-04-27 DIAGNOSIS — R1032 Left lower quadrant pain: Secondary | ICD-10-CM

## 2017-04-27 DIAGNOSIS — R112 Nausea with vomiting, unspecified: Principal | ICD-10-CM | POA: Insufficient documentation

## 2017-04-27 DIAGNOSIS — R110 Nausea: Secondary | ICD-10-CM

## 2017-04-27 DIAGNOSIS — R1012 Left upper quadrant pain: Secondary | ICD-10-CM

## 2017-04-27 HISTORY — DX: Sleep apnea, unspecified: G47.30

## 2017-04-27 HISTORY — DX: Essential (primary) hypertension: I10

## 2017-04-27 LAB — URINALYSIS REFLEX TO CULTURE
Leuk Esterase,UA: NEGATIVE
Nitrite,UA: NEGATIVE
Protein,UA: 100 mg/dL — AB
Specific Gravity,UA: 1.027 (ref 1.002–1.030)
pH,UA: 5 (ref 5.0–8.0)

## 2017-04-27 LAB — RUQ PANEL (ED ONLY)
ALT: 11 U/L (ref 0–35)
AST: 16 U/L (ref 0–35)
Albumin: 4.5 g/dL (ref 3.5–5.2)
Alk Phos: 59 U/L (ref 35–105)
Amylase: 63 U/L (ref 28–100)
Bilirubin,Direct: <0.2 mg/dL (ref 0.0–0.3)
Bilirubin,Total: 0.4 mg/dL (ref 0.0–1.2)
Lipase: 23 U/L (ref 13–60)
Total Protein: 7.6 g/dL (ref 6.3–7.7)

## 2017-04-27 LAB — URINE MICROSCOPIC (IQ200)
Hyaline Casts,UA: 1 /lpf (ref 0–2)
RBC,UA: 2 /hpf (ref 0–2)
WBC,UA: 4 /hpf (ref 0–5)

## 2017-04-27 LAB — CBC AND DIFFERENTIAL
Baso # K/uL: 0 10*3/uL (ref 0.0–0.1)
Basophil %: 0.3 %
Eos # K/uL: 0 10*3/uL (ref 0.0–0.4)
Eosinophil %: 0.2 %
Hematocrit: 32 % — ABNORMAL LOW (ref 34–45)
Hemoglobin: 10.7 g/dL — ABNORMAL LOW (ref 11.2–15.7)
IMM Granulocytes #: 0 10*3/uL (ref 0.0–0.1)
IMM Granulocytes: 0.3 %
Lymph # K/uL: 1.6 10*3/uL (ref 1.2–3.7)
Lymphocyte %: 23.4 %
MCH: 27 pg/cell (ref 26–32)
MCHC: 34 g/dL (ref 32–36)
MCV: 81 fL (ref 79–95)
Mono # K/uL: 0.2 10*3/uL (ref 0.2–0.9)
Monocyte %: 3.3 %
Neut # K/uL: 4.8 10*3/uL (ref 1.6–6.1)
Nucl RBC # K/uL: 0 10*3/uL (ref 0.0–0.0)
Nucl RBC %: 0 /100 WBC (ref 0.0–0.2)
Platelets: 381 10*3/uL — ABNORMAL HIGH (ref 160–370)
RBC: 3.9 MIL/uL (ref 3.9–5.2)
RDW: 15.6 % — ABNORMAL HIGH (ref 11.7–14.4)
Seg Neut %: 72.5 %
WBC: 6.6 10*3/uL (ref 4.0–10.0)

## 2017-04-27 LAB — PREGNANCY TEST, SERUM: Preg,Serum: NEGATIVE

## 2017-04-27 LAB — PLASMA PROF 7 (ED ONLY)
Anion Gap,PL: 12 (ref 7–16)
CO2,Plasma: 26 mmol/L (ref 20–28)
Chloride,Plasma: 101 mmol/L (ref 96–108)
Creatinine: 0.56 mg/dL (ref 0.51–0.95)
GFR,Black: 133 *
GFR,Caucasian: 115 *
Glucose,Plasma: 118 mg/dL — ABNORMAL HIGH (ref 60–99)
Potassium,Plasma: 3 mmol/L — ABNORMAL LOW (ref 3.4–4.7)
Sodium,Plasma: 139 mmol/L (ref 133–145)
UN,Plasma: 13 mg/dL (ref 6–20)

## 2017-04-27 LAB — HOLD BLUE

## 2017-04-27 MED ORDER — PROMETHAZINE HCL 25 MG/ML IJ SOLN *I*
12.5000 mg | Freq: Once | INTRAMUSCULAR | Status: AC
Start: 2017-04-27 — End: 2017-04-27
  Administered 2017-04-27: 12.5 mg via INTRAVENOUS
  Filled 2017-04-27: qty 1

## 2017-04-27 MED ORDER — POTASSIUM BICARBONATE 25 MEQ PO TBEF *I*
50.0000 meq | EFFERVESCENT_TABLET | Freq: Once | ORAL | Status: AC
Start: 2017-04-27 — End: 2017-04-27
  Administered 2017-04-27: 50 meq via ORAL
  Filled 2017-04-27: qty 2

## 2017-04-27 MED ORDER — IOHEXOL 350 MG/ML (OMNIPAQUE) IV SOLN *I*
1.0000 mL | Freq: Once | INTRAVENOUS | Status: AC
Start: 2017-04-27 — End: 2017-04-27
  Administered 2017-04-27: 128 mL via INTRAVENOUS

## 2017-04-27 MED ORDER — STERILE WATER FOR IRRIGATION IR SOLN *I*
900.0000 mL | Freq: Once | Status: AC
Start: 2017-04-27 — End: 2017-04-27
  Administered 2017-04-27: 900 mL via ORAL

## 2017-04-27 MED ORDER — ALUM & MAG HYDROXIDE-SIMETH 200-200-20 MG/5ML PO SUSP *I*
30.0000 mL | Freq: Once | ORAL | Status: AC
Start: 2017-04-27 — End: 2017-04-27
  Administered 2017-04-27: 30 mL via ORAL

## 2017-04-27 MED ORDER — SODIUM CHLORIDE 0.9 % IV BOLUS *I*
1000.0000 mL | Freq: Once | Status: AC
Start: 2017-04-27 — End: 2017-04-27
  Administered 2017-04-27: 1000 mL via INTRAVENOUS

## 2017-04-27 MED ORDER — ACETAMINOPHEN 500 MG PO TABS *I*
1000.0000 mg | ORAL_TABLET | Freq: Once | ORAL | Status: AC
Start: 2017-04-27 — End: 2017-04-27
  Administered 2017-04-27: 1000 mg via ORAL
  Filled 2017-04-27: qty 2

## 2017-04-27 MED ORDER — FAMOTIDINE IN NACL 20 MG/50ML IV SOLN *I*
20.0000 mg | Freq: Once | INTRAVENOUS | Status: AC
Start: 2017-04-27 — End: 2017-04-27
  Administered 2017-04-27: 20 mg via INTRAVENOUS
  Filled 2017-04-27: qty 50

## 2017-04-27 MED ORDER — ONDANSETRON HCL 2 MG/ML IV SOLN *I*
4.0000 mg | Freq: Once | INTRAMUSCULAR | Status: AC
Start: 2017-04-27 — End: 2017-04-27
  Administered 2017-04-27: 4 mg via INTRAVENOUS
  Filled 2017-04-27: qty 2

## 2017-04-27 MED ORDER — DIPHENHYDRAMINE HCL 50 MG/ML IJ SOLN *I*
25.0000 mg | Freq: Once | INTRAMUSCULAR | Status: AC
Start: 2017-04-27 — End: 2017-04-27
  Administered 2017-04-27: 25 mg via INTRAVENOUS
  Filled 2017-04-27: qty 1

## 2017-04-27 MED ORDER — KETOROLAC TROMETHAMINE 30 MG/ML IJ SOLN *I*
30.0000 mg | Freq: Once | INTRAMUSCULAR | Status: AC
Start: 2017-04-27 — End: 2017-04-27
  Administered 2017-04-27: 30 mg via INTRAVENOUS
  Filled 2017-04-27: qty 1

## 2017-04-27 MED ORDER — PROCHLORPERAZINE EDISYLATE 5 MG/ML IJ SOLN WRAPPED *I*
10.0000 mg | Freq: Once | INTRAMUSCULAR | Status: AC
Start: 2017-04-27 — End: 2017-04-27
  Administered 2017-04-27: 10 mg via INTRAVENOUS
  Filled 2017-04-27: qty 2

## 2017-04-27 NOTE — ED Notes (Signed)
Pt presents to ED with c/o N/V/D that began Friday night. Pt was seen yesterday at [facility](pt has discharge papers) and was discharged and dx with viral gastroenteritis and given a prescription for imodium. Pt presents to ED d/t N/V. Pt c/o nausea and is not actively vomiting.

## 2017-04-27 NOTE — ED Notes (Signed)
04/27/17 2138   Observation Care   Observation care initiated  Yes   Patient has been verbally notified of their observation status Yes

## 2017-04-27 NOTE — ED Provider Progress Notes (Signed)
ED Provider Progress Note    Patient remains symptomatic after aggressive GI cocktail.  Reasonable to pursue advanced imaging at this time.  CT abdomen and pelvis with contrast ordered as well as Compazine and Benadryl.         Janee MornAndrew Alexander Romney Compean, MD, 04/27/2017, 8:35 PM    CT pending at time of signout to night team.     Janee Mornunkman, Chevonne Bostrom Alexander, MD  Resident  04/27/17 [phone]

## 2017-04-27 NOTE — First Provider Contact (Signed)
ED First Provider Contact Note    Initial provider evaluation performed by   ED First Provider Contact     Date/Time Event User Comments    04/27/17 1255 ED First Provider Contact [facility]areth Macdonnell, [facility] Initial Face to Face Provider Contact        40Y female presents via EMS with N/V/D, abdominal pain x yesterday. [facility].    Vital signs reviewed.    Orders placed:  LABS     Patient requires further evaluation.     Virgina EvenerMelinda F Kyndell Zeiser, [place], 04/27/2017, 12:55 PM    Supervising physician Dr Carloyn MannerPeter Crane was immediately available     Virgina EvenerMetcalfe, Abagale Boulos F, [place]  04/27/17 1259

## 2017-04-27 NOTE — ED Provider Notes (Addendum)
History     Chief Complaint   Patient presents with    Nausea     HPI Comments: [name] is a 41 y.o. Female with PMH notable for hypertension, dysfunctional uterine bleeding with periods twice per month, migraines, depression, anxiety, chronic low back pain, fibromyalgia, other issues as below, presenting with 2 days nausea, vomiting, diarrhea.  She was seen at [facility] yesterday and had normal blood work and was discharged without prescription for antiemetics.  Has been taking Imodium with resolution of her diarrhea but ongoing nausea and vomiting almost every hour.  Emesis is nonbloody, light yellow.  States Zofran she received an triage was not helpful.  Denies headache, chest pain, shortness of breath.  Has had cholecystectomy and C-section in the past.  States she has not been passing much gas--thinks last time was this morning.  Ongoing abdominal pain, 8 out of 10 in severity, sharp in quality, located periumbilically.  Of note she states she just started her period while in the waiting room.  Multiple other people at her place of employment sick with similar symptoms.        History provided by:  Patient  Language interpreter used: No        Medical/Surgical/Family History     Past Medical History:   Diagnosis Date    Anemia     Anxiety 01/12/2016    Arthritis     Depression     DJD (degenerative joint disease), lumbar     small disc disease    DUB (dysfunctional uterine bleeding)     Fibromyalgia 01/12/2016    GERD (gastroesophageal reflux disease)     Hypertension     Nicotine dependence 01/12/2016    Peptic ulcer     Sciatica of right side         Patient Active Problem List   Diagnosis Code    HTN (hypertension) I10    DJD (degenerative joint disease), lumbar M47.816    Chronic low back pain M54.5, G89.29    Sciatica of right side M54.31    Scoliosis of lumbar spine M41.9    Depression, unspecified depression type F32.9    Peptic ulcer K27.9    Anemia, unspecified type D64.9    DUB  (dysfunctional uterine bleeding) N93.8    Fibromyalgia M79.7    Migraines G43.909    Anxiety F41.9    Nicotine dependence F17.200    OSA (obstructive sleep apnea) G47.33    Acute lateral meniscus tear of left knee Z61.096E            Past Surgical History:   Procedure Laterality Date    ANTERIOR CRUCIATE LIGAMENT REPAIR Right 01/24/2015    CESAREAN SECTION, LOW TRANSVERSE  2007    CHOLECYSTECTOMY  1996    PR ARTHRS KNE SURG W/MENISCECTOMY MED/LAT W/SHVG Left 09/11/2016    Procedure: LEFT KNEE ARTHROSCOPY WITH PARTIAL LATERAL MENISCECTOMY, CHONDROPLASTY    ;  Surgeon: Arvil Persons, MD;  Location: [facility];  Service: Orthopedics     Family History   Problem Relation Age of Onset    Brain cancer Mother     Colon cancer Father 63    Asthma Brother           Social History   Substance Use Topics    Smoking status: Current Every Day Smoker     Packs/day: 0.50    Smokeless tobacco: Never Used    Alcohol use No     Living Situation  Questions Responses    Patient lives with     Homeless     Caregiver for other family member     External Services     Employment     Domestic Violence Risk                 Review of Systems   Review of Systems   Constitutional: Positive for chills. Negative for fever.   HENT: Positive for sore throat ("from vomiting"). Negative for congestion.    Eyes: Negative for visual disturbance.   Respiratory: Negative for shortness of breath.    Cardiovascular: Negative for chest pain.   Gastrointestinal: Positive for abdominal pain, diarrhea, nausea and vomiting.   Genitourinary: Negative for dysuria and frequency.   Musculoskeletal: Positive for back pain. Negative for arthralgias and myalgias.   Neurological: Negative for light-headedness and headaches.   Psychiatric/Behavioral: Negative for agitation and confusion.       Physical Exam     Triage Vitals  Triage Start: Start, (04/27/17 1249)   First Recorded BP: 138/90, Resp: 16, Temp: 36.7 C (98.1 F), Temp src: TEMPORAL  Oxygen Therapy SpO2: 98 %, Oximetry Source: Lt Hand, O2 Device: None (Room air), Heart Rate: 66, (04/27/17 1250) Heart Rate (via Pulse Ox): 72, (04/27/17 1336).  First Pain Reported  0-10 Scale: 8, (04/27/17 1250)       Physical Exam   Constitutional: She is oriented to person, place, and time. She appears well-developed and well-nourished. No distress.   HENT:   Head: Normocephalic and atraumatic.   Mouth/Throat: Oropharynx is clear and moist.   Eyes: Conjunctivae and EOM are normal. Pupils are equal, round, and reactive to light.   Neck: Normal range of motion. Neck supple.   Cardiovascular: Normal rate and regular rhythm.    Pulmonary/Chest: Effort normal and breath sounds normal.   Abdominal: Soft. She exhibits no distension. There is tenderness (mild) in the periumbilical area, left upper quadrant and left lower quadrant. There is CVA tenderness (L sided).   Musculoskeletal: Normal range of motion.   Neurological: She is alert and oriented to person, place, and time.   Skin: Skin is warm and dry. She is not diaphoretic.   Psychiatric: She has a normal mood and affect. Her behavior is normal.   Nursing note and vitals reviewed.      Medical Decision Making        Initial Evaluation:  ED First Provider Contact     Date/Time Event User Comments    04/27/17 1255 ED First Provider Contact [place], [facility] Initial Face to Face Provider Contact          Patient seen by me on 04/27/2017.    Assessment:  41 y.o.female comes to the ED with abdominal pain, nausea, vomiting.  She is well-appearing with reassuring vitals.  Mild tenderness noted on abdominal exam but grossly benign.  Multiple etiologies possible but presentation most consistent with viral gastroenteritis.  CVA tenderness concerning for UTI/pyelonephritis.  Blood work ordered in triage generally unremarkable.  Potassium 3.0.  Will replete.  In absence of leukocytosis or fever, appendicitis, diverticulitis less likely.  Will treat symptoms with IV fluids,  Phenergan, Maalox, Pepcid, Tylenol, Toradol.  Repeat abdominal exam after medications.  Will have low threshold to obtain CT but do not feel it is indicated at this time.      Differential Diagnosis includes:  Viral Gastroenteritis, UTI, pyelonephritis, nephrolithisis, less likely appendicitis, diverticulitis, SBO      Plan:   Orders  Placed This Encounter   Procedures    CBC and differential    Plasma profile 7 [facility] ED only)    RUQ panel    HCG, serum qualitative, pregnancy    Hold blue    HIV 1&2 antigen/antibody    Urinalysis with reflex to Microscopic UA and reflex to Bacterial Culture    Insert peripheral IV         Janee MornAndrew Alexander Dunkman, MD      Resident Attestation:    Patient seen by me on 04/27/2017.    History:  I reviewed this patient, reviewed the resident's note and agree.    Exam:  I examined this patient, reviewed the resident's note and agree.    Decision Making:  I discussed with the resident his/her documented decision making and agree with edits below.  Decision Making edits:  Labs with mild hypokalemia and have replaced this orally, however pt have persistent nausea and retching with abdominal pain, will get CT now..    Author:  Bluford KaufmannJoseph Donyetta Ogletree, DO       Darletta Mollunkman, Kirt BoysAndrew Alexander, MD  Resident  04/27/17 Jerrell [id number]       Keighan Amezcua, DO  04/27/17 2114

## 2017-04-27 NOTE — ED Triage Notes (Addendum)
Complains of nausea and vomiting since yesterday. Was seen [facility] yesterday for the same thing. Reports she has not been able to tolerate PO.        Triage Note   [name], RN

## 2017-04-28 LAB — HIV 1&2 ANTIGEN/ANTIBODY: HIV 1&2 ANTIGEN/ANTIBODY: NONREACTIVE

## 2017-04-28 MED ORDER — ONDANSETRON 4 MG PO TBDP *I*
4.0000 mg | ORAL_TABLET | Freq: Three times a day (TID) | ORAL | 0 refills | Status: AC | PRN
Start: 2017-04-28 — End: ?

## 2017-04-28 MED ORDER — ONDANSETRON 4 MG PO TBDP *I*
4.0000 mg | ORAL_TABLET | Freq: Once | ORAL | Status: AC
Start: 2017-04-28 — End: 2017-04-28
  Administered 2017-04-28: 4 mg via ORAL
  Filled 2017-04-28: qty 1

## 2017-04-28 NOTE — ED Notes (Signed)
Visualized patient. Patient appears in NAD and is resting in bed.     Will continue to monitor and treat as ordered.

## 2017-04-28 NOTE — ED Provider Progress Notes (Signed)
ED Provider Progress Note    Assumed care of patient in signout from Dr. Darletta Mollunkman.  In short, this is a 41 year old female presenting with nausea that has been intractable throughout ED course.  Labs reassuring, aside from noted hypokalemia (K3.0). CT abdomen and pelvis reassuring.     On repeat exam, patient is sleeping comfortably, awakens to voice. Tolerating PO without difficulty in ED. Stable for discharge at this time. Will prescribe zofran for home use PRN, and advised close follow up with PCP for recheck of electrolytes and H/H. Return precautions given. Discharge.     Garth Bignessachel Anne Kensington Rios, MD, 04/28/2017, 12:00 AM         Garth BignessNelson, Krystal Delduca Anne, MD  Resident  04/28/17 [phone]

## 2017-04-28 NOTE — ED Notes (Signed)
Report Given To  Cherlynn Junehristine Sz. RN      Descriptive Sentence / Reason for Admission   Presents to ED with c/o continuing N/V/D x 2 days. Pt was [facility] and discharged.       Active Issues / Relevant Events   Pt having waves of worsening nausea and abdominal pain. Pt received multiple medications with some improvement but nausea and abdominal pain persisted. Upon return from CT pt states that she feels improved.   K+ 3.0, received effervescent K+      To Do List  Awaiting CT results  VS, re-assessments        Anticipatory Guidance / Discharge Planning  Pending

## 2017-04-28 NOTE — Discharge Instructions (Signed)
You were seen today in the Emergency Department at [facility] for nausea and vomiting.  Your labs and imaging were reassuring. You were treated with zofran, compazine, and IV fluids in ED with improvement of symptoms. You should follow up with your primary care doctor for recheck of your labs, especially your potassium because it was low tonight. Please call to schedule an appointment.    You were prescribed zofran for nausea. Take as directed. You should continue to take your home medications as prescribed.    You should return to the emergency room or call your primary doctor should you have any further acute medical problems or any worsening symptoms, especially severe abdominal pain, passing out, severe vomiting, chest pain, or shortness of breath.    Should you have any further questions about your care or evaluation you should call or make an appointment with your primary physician to review them.

## 2017-04-28 NOTE — ED Notes (Signed)
Reviewed d/c instructions with patient, including medication information, follow up care, and symptoms to return to the ED for. Patient verbalized understanding of instructions.     Patient is ambulatory and able to leave the dept unassisted.

## 2017-07-21 ENCOUNTER — Other Ambulatory Visit: Payer: Self-pay | Admitting: Orthopedic Surgery

## 2017-07-21 ENCOUNTER — Encounter: Payer: Self-pay | Admitting: Orthopedic Surgery

## 2017-07-21 ENCOUNTER — Ambulatory Visit: Payer: Medicaid (Managed Care) | Admitting: Orthopedic Surgery

## 2017-07-21 ENCOUNTER — Ambulatory Visit
Admission: RE | Admit: 2017-07-21 | Discharge: 2017-07-21 | Disposition: A | Discharge status: Home / Self Care | Payer: Medicaid (Managed Care) | Source: Ambulatory Visit | Attending: Orthopedic Surgery | Admitting: Orthopedic Surgery

## 2017-07-21 VITALS — BP 127/83 | HR 76 | Ht 68.0 in | Wt 157.9 lb

## 2017-07-21 DIAGNOSIS — M25561 Pain in right knee: Secondary | ICD-10-CM | POA: Insufficient documentation

## 2017-07-21 DIAGNOSIS — M1711 Unilateral primary osteoarthritis, right knee: Secondary | ICD-10-CM

## 2017-07-21 DIAGNOSIS — G8929 Other chronic pain: Secondary | ICD-10-CM | POA: Insufficient documentation

## 2017-07-21 DIAGNOSIS — M25562 Pain in left knee: Secondary | ICD-10-CM

## 2017-07-21 MED ORDER — LIDOCAINE HCL 1 % IJ SOLN *I*
5.0000 mL | Freq: Once | INTRAMUSCULAR | Status: AC | PRN
Start: 2017-07-21 — End: 2017-07-21
  Administered 2017-07-21: 5 mL via INTRA_ARTICULAR

## 2017-07-21 NOTE — Procedures (Signed)
Large Joint Aspiration/Injection Procedure    Date/Time: 07/21/2017 2:48 PM  Consent given by: patient  Site marked: site marked  Timeout: Immediately prior to procedure a time out was called to verify the correct patient, procedure, equipment, support staff and site/side marked as required     Procedure Details    Location: knee - L knee intra - articular  Preparation: The site was prepped using the usual aseptic technique.  Anesthetics administered: 5 mL lidocaine 1 %  Dressing:  A dry, sterile dressing was applied.  Patient tolerance: patient tolerated the procedure well with no immediate complications

## 2017-07-21 NOTE — Progress Notes (Signed)
[name], [name]  MR #:  [id number]   CSN:  [id number] DOB:  1975-10-24   DICTATED BY:  Chaya Jan, PA DATE OF VISIT:  07/21/2017     CHIEF COMPLAINT:  Ongoing right knee pain.    INTERVAL HISTORY:  [name] has had a previous ACL reconstruction done down in [place] approximately 2-3 years ago of this right knee.  [name] had an arthroscopic surgery by our office last year, had good relief from that, but [name] states the pain is starting to come back.  It is very achy.  [name] has noticed some swelling as well.  At that time of surgery, [name] was noted to have significant arthritis.  [name] follows up today, denies any new injury or trauma.  Has been trying to take anti-inflammatories with no real relief.  Denies any mechanical symptoms.  No locking or catching.  [name] is here requesting cortisone today.     Past medical, surgical, family, social history, medications, allergies, and review of systems were reviewed, unchanged from previous visit.    PHYSICAL EXAM:  [name] is a 41 year old woman, alert and oriented x3, pleasant mood and affect, appears in no acute distress.  [name] can fully extend the knee to 0 degrees.  Flexion is to 120.  [name] has crepitus with range of motion.  No pain to palpation in quadriceps or patellar tendon.  No joint line tenderness.  [name] is stable to both varus and valgus force.  Anterior and posterior drawer testing and Lachman testing negative.  [name] has no discomfort with circumduction, compression maneuvers.  Calves are soft and nontender bilaterally.  Distal neurovascular exam intact.    IMAGING:  New x-rays were obtained today and personally reviewed, showing no significant narrowing in joint space from her previous films approximately a year ago.  Previous hardware is noted from her ACL reconstruction.    ASSESSMENT AND PLAN:  [name] is a 41 year old woman, here with known osteoarthritis of the knee based on her arthroscopic surgery last year.  [name] does not appear to have any  significant changes on her x-ray at this time.  We did discuss the role of an intra-articular cortisone injection.  [name] understands the risks and benefits, and states that [name] would like to proceed.  [name] will limit her activities tonight, go back to full activity in the next 48 hours.  We will also refer her to one of our nonoperative physicians for consideration of viscosupplementation.  Questions were invited and answered to patient's satisfaction.  If [name] should have any further questions or concerns, feel free to contact the office.       Dictated By:  Chaya Jan, PA      ______________________________  Arvil Persons, MD    DGK/MODL  DD:  07/21/2017 09:36:20  DT:  07/21/2017 09:45:55  Job #:  [id number]    cc:

## 2017-07-22 ENCOUNTER — Encounter: Payer: Self-pay | Admitting: Gastroenterology

## 2017-07-23 ENCOUNTER — Encounter: Payer: Self-pay | Admitting: Physical Medicine and Rehabilitation

## 2017-07-23 ENCOUNTER — Ambulatory Visit
Payer: Medicaid (Managed Care) | Attending: Physical Medicine and Rehabilitation | Admitting: Physical Medicine and Rehabilitation

## 2017-07-23 VITALS — BP 131/86 | HR 67 | Ht 68.0 in | Wt 157.0 lb

## 2017-07-23 DIAGNOSIS — M170 Bilateral primary osteoarthritis of knee: Secondary | ICD-10-CM

## 2017-07-23 NOTE — Patient Instructions (Signed)
My secretary will be submitting an injection booking sheet to our Pre-Verification Billing Department.      Prior authorization, billing and scheduling typically takes 2 weeks.    Once you receive a call from our Pre-Verification Department stating if there is any deductible, or outstanding balance, payment will be required before you can schedule an appointment to get the injection done.    Once payment has been made, please call my secretary Mckell @ [phone] to schedule injection appointment.        Monovisc, Orthovisc, Euflexxa are possible options

## 2017-07-23 NOTE — Progress Notes (Signed)
Chief complaint:  Knee Pain    History of Present Illness:  This is a 41 y.o. female who presents with bilateral knee pain.  Pain started August of 2017 and pain started at that time.  Had a right ACL reconstruction one year prior to that.  The patient was referred by Dr Carley Hammed.    The right knee is most painful for her but the left knee also gives her trouble.  The right knee pain is burning and located at the right lateral anterior knee and posterior knee.  The left knee pain is located around the knee cap.  The pain is made worse with squatting, getting up from the ground, walking, stairs.  Limited the distance she can walk now.    Reports swelling in the right knee.  Reports some instability in the right knee.  No true locking.  Does feel she gets "stuck" when gets in a low squat.    Exercise: biking, hep    Not currently working- pending disability.    Specific functional goals of viscosupplementation:  -be able to walk longer distances  -be able to exercise with less pain     Treatment History:    Physical therapy:   Yes  No  Helpful  Somewhat Helpful  Not helpful     Exercise: does home exercise program from PT  Yes  No  Helpful  Somewhat Helpful  Not helpful     Activity modification:   Yes  No  Helpful  Somewhat Helpful  Not helpful     Acetaminophen:   Yes  No  Helpful  Somewhat Helpful  Not helpful      Percocet:   Yes  No  Helpful  Somewhat Helpful  Not helpful     NSAIDs: unable to take due to ulcer   Yes  No  Helpful  Somewhat Helpful  Not helpful     Corticosteroid injections:  Made pain worse  Yes  No  Helpful  Somewhat Helpful  Not helpful     Viscosupplementation:   Yes  No  Helpful  Somewhat Helpful  Not helpful     Surgery:  Right knee ACL reconstruction 2016, bilateral knee scopes 2017  Yes  No  Helpful  Somewhat Helpful  Not helpful     Past Medical History:  Past Medical  History:   Diagnosis Date    Anemia     Anxiety 01/12/2016    Arthritis     Depression     DJD (degenerative joint disease), lumbar     small disc disease    DUB (dysfunctional uterine bleeding)     Fibromyalgia 01/12/2016    GERD (gastroesophageal reflux disease)     High blood pressure     Hypertension     Nicotine dependence 01/12/2016    Peptic ulcer     Sciatica of right side     Sleep apnea        Past Surgical History:  Past Surgical History:   Procedure Laterality Date    ANTERIOR CRUCIATE LIGAMENT REPAIR Right 01/24/2015    CESAREAN SECTION, LOW TRANSVERSE  2007    CHOLECYSTECTOMY  1996    PR ARTHRS KNE SURG W/MENISCECTOMY MED/LAT W/SHVG Left 09/11/2016    Procedure: LEFT KNEE ARTHROSCOPY WITH PARTIAL LATERAL MENISCECTOMY, CHONDROPLASTY    ;  Surgeon: Arvil Persons, MD;  Location: [facility];  Service: Orthopedics       Social History:  Social History  Social History    Marital status: Single     Spouse name: N/A    Number of children: N/A    Years of education: N/A     Social History Main Topics    Smoking status: Current Every Day Smoker     Packs/day: 0.50    Smokeless tobacco: Never Used    Alcohol use No    Drug use: No    Sexual activity: Yes     Partners: Male     Birth control/ protection: Surgical     Other Topics Concern    None     Social History Narrative       Family History:  Family History   Problem Relation Age of Onset    Brain cancer Mother     Colon cancer Father 23    Asthma Brother        Review of Systems:  A complete 12 point review is negative except for the following:  Knee pain   Depression  Anxiety  Ulcer      Current Medications:  Current Outpatient Prescriptions on File Prior to Visit   Medication Sig Dispense Refill    Medical marijuana as needed      ondansetron (ZOFRAN-ODT) 4 MG disintegrating tablet Take 1 tablet (4 mg total) by mouth 3 times daily as needed   Place on top of tongue. 30 tablet 0    oxyCODONE-acetaminophen (PERCOCET)  5-325 MG per tablet Take 1 tablet by mouth every 6 hours as needed   Max daily dose: 4 tablets 20 tablet 0    meloxicam (MOBIC) 15 MG tablet Take 15 mg by mouth daily   Take with food.      hydrOXYzine HCl (ATARAX) 10 MG tablet Take 10 mg by mouth 3 times daily as needed         mirtazapine (REMERON) 15 MG tablet Take 15 mg by mouth nightly         gabapentin (GABAPENTIN)  tablet Take 800 mg by mouth nightly         lisinopril-hydrochlorothiazide (PRINZIDE,ZESTORETIC) 10-12.5 MG per tablet Take 1 tablet by mouth      DULoxetine (CYMBALTA) 20 MG DR capsule Take 40 mg by mouth      pregabalin (LYRICA) 150 MG capsule Take 150 mg by mouth 2 times daily      omeprazole (PRILOSEC) 40 MG capsule Take 1 capsule (40 mg total) by mouth daily 30 capsule 5    amLODIPine (NORVASC) 10 MG tablet Take 1 tablet (10 mg total) by mouth daily 30 tablet 5    ferrous sulfate 325 (65 FE) MG tablet Take 1 tablet (325 mg total) by mouth daily (with breakfast) 100 tablet 3     No current facility-administered medications on file prior to visit.        Allergies:  No Known Allergies (drug, envir, food or latex)    Physical Exam:  Pain    07/23/17 0908   PainSc:   9   PainLoc: Knee     Blood pressure 131/86, pulse 67, height 1.727 m ( ), weight 71.2 kg (157 lb).  Body mass index is 23.87 kg/(m^2).  Taken by patient care tech    General: NAD , pleasant and cooperative  HEENT: MMM, no scleral icterus  CVS: good perfusion   Pulm: normal work of breathing   Skin: no rashes   Pysch: normal affect    MSK/neuro:   Knees appear symmetrical without any lesions, erythema  or deformity.  No warmth.   No effusion.   + crepitus with passive range of motion.  No pain to palpation around borders of patella. +bilateral medial joint line tenderness.  +bilateral lateral joint line tenderness.  no quad tendon tenderness.  no patellar tendon tenderness.  Range of motion 0-125 bilaterally. Neurovascularly intact.   Some ligamentous laxity noted  bilateral knees but equal bilaterally on gross exam.  Mild right sided pain with circumduction maneuvers.  Gait steady and independent.     Assessment:  This is a 41 y.o. female who presents with bilateral knee pain.  -Early bilateral knee osteoarthritis    Plan:  Imaging reviewed.    I discussed the following treatment options:   Activity modifications discussed and recommended.   Discussed weight loss or maintenance of proper weight through diet and exercise.  Ice/heat to knee.  Medication options discussed and recommended - encouraged tylenol and NSAIDs prn as able.  Discussed injections including corticosteroid injections, orthovisc and monovisc injections.      Patient has failed to respond to conservative, non-pharmacologic therapy (e.g., physical therapy, exercise or activity modification); simple analgesics (e.g. Acetaminophen); is intolerant of or has contraindications to non-steroidal anti-inflammatory drugs (NSAIDs), is unable to be maintained on NSAIDs, and/or has failed to respond to NSAIDs; has failed to respond to, has contraindications to, or refuses steroid injections; and pain duration has been for at least 6 months.  Therefore patient is a good candidate for viscosupplementation.    Continue with existing conservative treatment program.   Questions solicited and answered.   My office will work on prior authorization of viscosupplementation.    Thank you for allowing [place] to take part in this patient's care and contact us if you have any questions.    Terance Hart, MD

## 2017-08-22 ENCOUNTER — Ambulatory Visit: Payer: Medicaid (Managed Care) | Admitting: Student in an Organized Health Care Education/Training Program

## 2017-08-26 ENCOUNTER — Telehealth: Payer: Self-pay

## 2017-08-26 NOTE — Telephone Encounter (Signed)
[name] called patient to reschedule failed consult appointment for patient with Oral Maxillofacial Surgery.     Does the patient need to rescheduled? yes    Were you able to leave a message? yes    Is the phone number correct for patient? yes

## 2017-09-05 ENCOUNTER — Telehealth: Payer: Self-pay

## 2017-09-05 NOTE — Telephone Encounter (Signed)
Ms. Odowd called patient to reschedule failed consult appointment for patient with Oral Maxillofacial Surgery.     Does the patient need to rescheduled? yes    Were you able to leave a message? yes    Is the phone number correct for patient? yes

## 2017-09-24 ENCOUNTER — Emergency Department
Admission: EM | Admit: 2017-09-24 | Discharge: 2017-09-25 | Disposition: A | Discharge status: Home / Self Care | Payer: Medicaid (Managed Care) | Source: Ambulatory Visit | Attending: Emergency Medicine | Admitting: Emergency Medicine

## 2017-09-24 DIAGNOSIS — R112 Nausea with vomiting, unspecified: Secondary | ICD-10-CM | POA: Insufficient documentation

## 2017-09-24 DIAGNOSIS — R1084 Generalized abdominal pain: Secondary | ICD-10-CM

## 2017-09-24 LAB — CBC AND DIFFERENTIAL
Baso # K/uL: 0 10*3/uL (ref 0.0–0.1)
Basophil %: 0.3 %
Eos # K/uL: 0 10*3/uL (ref 0.0–0.4)
Eosinophil %: 0.1 %
Hematocrit: 30 % — ABNORMAL LOW (ref 34–45)
Hemoglobin: 10.1 g/dL — ABNORMAL LOW (ref 11.2–15.7)
IMM Granulocytes #: 0 10*3/uL (ref 0.0–0.1)
IMM Granulocytes: 0.4 %
Lymph # K/uL: 1.5 10*3/uL (ref 1.2–3.7)
Lymphocyte %: 14.6 %
MCH: 28 pg (ref 26–32)
MCHC: 33 g/dL (ref 32–36)
MCV: 83 fL (ref 79–95)
Mono # K/uL: 0.4 10*3/uL (ref 0.2–0.9)
Monocyte %: 3.9 %
Neut # K/uL: 8.2 10*3/uL — ABNORMAL HIGH (ref 1.6–6.1)
Nucl RBC # K/uL: 0 10*3/uL (ref 0.0–0.0)
Nucl RBC %: 0 /100 WBC (ref 0.0–0.2)
Platelets: 277 10*3/uL (ref 160–370)
RBC: 3.6 MIL/uL — ABNORMAL LOW (ref 3.9–5.2)
RDW: 17.1 % — ABNORMAL HIGH (ref 11.7–14.4)
Seg Neut %: 80.7 %
WBC: 10.1 10*3/uL — ABNORMAL HIGH (ref 4.0–10.0)

## 2017-09-24 LAB — RUQ PANEL (ED ONLY)
ALT: 7 U/L (ref 0–35)
AST: 20 U/L (ref 0–35)
Albumin: 4.2 g/dL (ref 3.5–5.2)
Alk Phos: 51 U/L (ref 35–105)
Amylase: 61 U/L (ref 28–100)
Bilirubin,Direct: <0.2 mg/dL (ref 0.0–0.3)
Bilirubin,Total: <0.2 mg/dL (ref 0.0–1.2)
Globulin: 3 g/dL (ref 2.7–4.3)
Lipase: 19 U/L (ref 13–60)
Total Protein: 7.2 g/dL (ref 6.3–7.7)

## 2017-09-24 LAB — BASIC METABOLIC PANEL
Anion Gap: 13 (ref 7–16)
CO2: 23 mmol/L (ref 20–28)
Calcium: 8.9 mg/dL (ref 8.8–10.2)
Chloride: 101 mmol/L (ref 96–108)
Creatinine: 0.58 mg/dL (ref 0.51–0.95)
GFR,Black: 131 *
GFR,Caucasian: 114 *
Glucose: 103 mg/dL — ABNORMAL HIGH (ref 60–99)
Lab: 13 mg/dL (ref 6–20)
Potassium: 3.8 mmol/L (ref 3.3–5.1)
Sodium: 137 mmol/L (ref 133–145)

## 2017-09-24 LAB — BHCG, QUANT PREGNANCY: BHCG, QUANT PREGNANCY: <1 m[IU]/mL (ref 0–1)

## 2017-09-24 MED ORDER — DEXTROSE 5 % FLUSH FOR PUMPS *I*
0.0000 mL/h | INTRAVENOUS | Status: DC | PRN
Start: 2017-09-24 — End: 2017-09-25

## 2017-09-24 MED ORDER — SODIUM CHLORIDE 0.9 % IV SOLN WRAPPED *I*
1000.0000 mL | Freq: Once | Status: AC
Start: 2017-09-24 — End: 2017-09-25
  Administered 2017-09-24: 1000 mL via INTRAVENOUS

## 2017-09-24 MED ORDER — SODIUM CHLORIDE 0.9 % FLUSH FOR PUMPS *I*
0.0000 mL/h | INTRAVENOUS | Status: DC | PRN
Start: 2017-09-24 — End: 2017-09-25

## 2017-09-24 MED ORDER — ONDANSETRON HCL 2 MG/ML IV SOLN *I*
4.0000 mg | Freq: Once | INTRAMUSCULAR | Status: AC
Start: 2017-09-24 — End: 2017-09-24
  Administered 2017-09-24: 4 mg via INTRAVENOUS
  Filled 2017-09-24: qty 2

## 2017-09-24 NOTE — ED Triage Notes (Signed)
Patient presents to ED via EMS w/ c/o nausea and vomiting within the last hour. Per EMS, pt not answering questions but bf states she was eating a steak when he left.        Triage Note   Nestor RampJulianna Lior Hoen, RN

## 2017-09-25 ENCOUNTER — Encounter: Payer: Self-pay | Admitting: Emergency Medicine

## 2017-09-25 ENCOUNTER — Telehealth: Payer: Self-pay

## 2017-09-25 NOTE — ED Provider Notes (Signed)
History     Chief Complaint   Patient presents with    Emesis     Patient is a 41 year old female history anemia, anxiety, arthritis, depression, fibromyalgia, GERD, hypertension who presents to the emergency department with complaint of abdominal pain.  Patient reports that she's been having constant abdominal pain for the last 2 months.  Patient states that for the last week it has been worsening.  Patient endorses nausea and vomiting.  Denies any diarrhea or constipation.  Denies any urinary symptoms.  Denies any fevers or chills.  Patient reports that she's been taking her friend's Percocet, medical marijuana and antibiotics.  Patient is unsure what antibiotic she was given.        History provided by:  Patient  Language interpreter used: No        Medical/Surgical/Family History     Past Medical History:   Diagnosis Date    Anemia     Anxiety 01/12/2016    Arthritis     Depression     DJD (degenerative joint disease), lumbar     small disc disease    DUB (dysfunctional uterine bleeding)     Fibromyalgia 01/12/2016    GERD (gastroesophageal reflux disease)     High blood pressure     Hypertension     Nicotine dependence 01/12/2016    Peptic ulcer     Sciatica of right side     Sleep apnea         Patient Active Problem List   Diagnosis Code    HTN (hypertension) I10    DJD (degenerative joint disease), lumbar M47.816    Chronic low back pain M54.5, G89.29    Sciatica of right side M54.31    Scoliosis of lumbar spine M41.9    Depression, unspecified depression type F32.9    Peptic ulcer K27.9    Anemia, unspecified type D64.9    DUB (dysfunctional uterine bleeding) N93.8    Fibromyalgia M79.7    Migraines G43.909    Anxiety F41.9    Nicotine dependence F17.200    OSA (obstructive sleep apnea) G47.33    Acute lateral meniscus tear of left knee W11.914NS83.282A            Past Surgical History:   Procedure Laterality Date    ANTERIOR CRUCIATE LIGAMENT REPAIR Right 01/24/2015    CESAREAN  SECTION, LOW TRANSVERSE  2007    CHOLECYSTECTOMY  1996    PR ARTHRS KNE SURG W/MENISCECTOMY MED/LAT W/SHVG Left 09/11/2016    Procedure: LEFT KNEE ARTHROSCOPY WITH PARTIAL LATERAL MENISCECTOMY, CHONDROPLASTY    ;  Surgeon: Arvil PersonsGiordano, Brian D, MD;  Location: [facility];  Service: Orthopedics     Family History   Problem Relation Age of Onset    Brain cancer Mother     Colon cancer Father 1742    Asthma Brother           Social History   Substance Use Topics    Smoking status: Current Every Day Smoker     Packs/day: 0.50    Smokeless tobacco: Never Used    Alcohol use No     Living Situation     Questions Responses    Patient lives with     Homeless     Caregiver for other family member     External Services     Employment     Domestic Violence Risk  Review of Systems   Review of Systems   Constitutional: Negative for activity change, appetite change, chills, diaphoresis, fatigue, fever and unexpected weight change.   HENT: Negative.    Eyes: Negative for photophobia and visual disturbance.   Respiratory: Negative for apnea, cough, choking, chest tightness, shortness of breath, wheezing and stridor.    Cardiovascular: Negative for chest pain, palpitations and leg swelling.   Gastrointestinal: Positive for abdominal pain, nausea and vomiting. Negative for abdominal distention, constipation and diarrhea.   Endocrine: Negative.    Genitourinary: Negative for dysuria, flank pain, frequency, hematuria and urgency.   Musculoskeletal: Negative for arthralgias, back pain, gait problem, joint swelling, myalgias, neck pain and neck stiffness.   Skin: Negative for color change, pallor, rash and wound.   Allergic/Immunologic: Negative.    Neurological: Negative for dizziness, weakness, light-headedness and headaches.   Hematological: Negative.    Psychiatric/Behavioral: Negative for self-injury and suicidal ideas.   All other systems reviewed and are negative.      Physical Exam     Triage Vitals      First  Recorded BP: (!) 150/94, Resp: 18, Temp: 36.2 C (97.2 F) Oxygen Therapy SpO2: 97 %, O2 Device: None (Room air), Heart Rate: 74, (09/24/17 1953)  .  First Pain Reported  0-10 Scale: 10, Pain Location/Orientation: Abdomen, (09/24/17 1953)       Physical Exam   Constitutional: She is oriented to person, place, and time. She appears well-developed. No distress.   Appears well and in no acute distress     HENT:   Head: Normocephalic.   Eyes: Pupils are equal, round, and reactive to light. Conjunctivae and EOM are normal.   Neck: Normal range of motion. Neck supple.   Cardiovascular: Normal rate, regular rhythm and normal heart sounds.    Pulmonary/Chest: Effort normal and breath sounds normal. No respiratory distress. She has no wheezes. She has no rales. She exhibits no tenderness.   Abdominal: Soft. Bowel sounds are normal. She exhibits no distension. There is tenderness.   Abdomen soft, non-distended  Generalized tenderness     Musculoskeletal: Normal range of motion. She exhibits no edema, tenderness or deformity.   Neurological: She is alert and oriented to person, place, and time.   Skin: Skin is warm and dry. No rash noted. She is not diaphoretic. No erythema. No pallor.   Psychiatric: She has a normal mood and affect. Her behavior is normal. Judgment and thought content normal.   Nursing note and vitals reviewed.      Medical Decision Making      Amount and/or Complexity of Data Reviewed  Clinical lab tests: ordered and reviewed        Initial Evaluation:  ED First Provider Contact     Date/Time Event User Comments    09/24/17 2300 ED First Provider Contact Zayde Stroupe Initial Face to Face Provider Contact          Patient seen by me on arrival date of 09/24/2017.    Assessment:  41 y.o.female comes to the ED with complaint of abdominal pain with nausea and vomiting       Differential Diagnosis includes:  Viral gastroenteritis, dehydration, electrolyte abnormalities, less likely intra-abdominal  infection-pt has had symptoms for two months     Plan:   CBC with diff  BMP  RUQ  Beta HCG   IV hydration   Zofran     Labs showed:  Labs Reviewed   CBC AND DIFFERENTIAL - Abnormal; Notable for  the following:        Result Value    WBC 10.1 (*)     RBC 3.6 (*)     Hemoglobin 10.1 (*)     Hematocrit 30 (*)     RDW 17.1 (*)     Neut # K/uL 8.2 (*)     All other components within normal limits   BASIC METABOLIC PANEL - Abnormal; Notable for the following:     Glucose 103 (*)     All other components within normal limits   RUQ PANEL (ED ONLY)   BHCG,SERUM     Patient resting comfortably in room in no acute distress.  Patient tolerating by mouth.  Plan to discharge patient home with instructions to follow-up with GI, referral placed.  Patient encouraged follow-up with PCP and return to the emergency department with worsening symptoms or concerns.  Patient is agreeable with this plan and understands return precautions.           Curlene DolphinMiranda Trude Cansler, NP          Curlene Dolphinaccamise, Jolette Lana, NP  09/25/17 2007

## 2017-09-25 NOTE — Telephone Encounter (Signed)
Ms. [name] is returning a call from [name]. She states this is regarding scheduling a NPV per urgent ED referral. Writer unable to schedule, soon enough appointments are not available to Clinical research associatewriter. She is requesting a call back at [phone].

## 2017-09-25 NOTE — Discharge Instructions (Addendum)
Rest, stay hydrated   Follow-up with your PCP and GI   Return to the ED with worsening symptoms or concerns

## 2017-09-26 ENCOUNTER — Ambulatory Visit
Admission: RE | Admit: 2017-09-26 | Discharge: 2017-09-26 | Disposition: A | Discharge status: Home / Self Care | Payer: Medicaid (Managed Care) | Source: Ambulatory Visit | Attending: Radiology | Admitting: Radiology

## 2017-09-26 ENCOUNTER — Other Ambulatory Visit
Admission: RE | Admit: 2017-09-26 | Discharge: 2017-09-26 | Disposition: A | Discharge status: Home / Self Care | Payer: Medicaid (Managed Care) | Source: Ambulatory Visit | Attending: General Practice | Admitting: General Practice

## 2017-09-26 ENCOUNTER — Other Ambulatory Visit: Payer: Self-pay | Admitting: General Practice

## 2017-09-26 DIAGNOSIS — R1084 Generalized abdominal pain: Secondary | ICD-10-CM | POA: Insufficient documentation

## 2017-09-26 LAB — HEPATIC FUNCTION PANEL
ALT: 5 U/L (ref 0–35)
AST: 16 U/L (ref 0–35)
Albumin: 4.5 g/dL (ref 3.5–5.2)
Alk Phos: 55 U/L (ref 35–105)
Bilirubin,Direct: <0.2 mg/dL (ref 0.0–0.3)
Bilirubin,Total: 0.3 mg/dL (ref 0.0–1.2)
Total Protein: 7.4 g/dL (ref 6.3–7.7)

## 2017-09-26 NOTE — Telephone Encounter (Signed)
Called pt and left message       Notifying pt that we are trying to fit her into the schedule and as soon as we have an appt we will be in touch

## 2017-09-26 NOTE — Telephone Encounter (Signed)
Please be aware this referral is in progress .... We are working on fitting the pt into the schedule and as soon as we have an appt we will be in touch

## 2017-09-26 NOTE — Telephone Encounter (Signed)
Note    ----- Message -----  From: Fernanda DrumPerry, Kelly, RN  Sent: 09/25/2017  9:19 AM  To: Blinda LeatherwoodLisa Balkum      Any GI MLP or fellow 4-5 weeks

## 2017-09-28 ENCOUNTER — Other Ambulatory Visit
Admission: RE | Admit: 2017-09-28 | Discharge: 2017-09-28 | Disposition: A | Discharge status: Home / Self Care | Payer: Medicaid (Managed Care) | Source: Ambulatory Visit | Attending: General Practice | Admitting: General Practice

## 2017-09-28 DIAGNOSIS — R1084 Generalized abdominal pain: Secondary | ICD-10-CM | POA: Insufficient documentation

## 2017-09-29 LAB — CLOSTRIDIUM DIFFICILE EIA: C difficile Toxins A and B EIA: 0

## 2017-09-29 LAB — H. PYLORI ANTIGEN, STOOL: H. pylori Stool Antigen EIA: 0

## 2017-09-30 ENCOUNTER — Telehealth: Payer: Self-pay | Admitting: Gastroenterology

## 2017-09-30 ENCOUNTER — Encounter: Payer: Self-pay | Admitting: Gastroenterology

## 2017-09-30 ENCOUNTER — Ambulatory Visit
Admission: RE | Admit: 2017-09-30 | Discharge: 2017-09-30 | Disposition: A | Discharge status: Home / Self Care | Payer: Medicaid (Managed Care) | Source: Ambulatory Visit | Attending: Gastroenterology | Admitting: Gastroenterology

## 2017-09-30 VITALS — BP 182/91 | HR 77 | Ht 68.0 in | Wt 155.1 lb

## 2017-09-30 DIAGNOSIS — R109 Unspecified abdominal pain: Secondary | ICD-10-CM

## 2017-09-30 DIAGNOSIS — R634 Abnormal weight loss: Secondary | ICD-10-CM

## 2017-09-30 DIAGNOSIS — K219 Gastro-esophageal reflux disease without esophagitis: Secondary | ICD-10-CM

## 2017-09-30 MED ORDER — SUCRALFATE 1 GM/10ML PO SUSP *I*
1.0000 g | Freq: Two times a day (BID) | ORAL | 1 refills | Status: DC
Start: 2017-09-30 — End: 2017-10-03

## 2017-09-30 MED ORDER — PANTOPRAZOLE SODIUM 40 MG PO TBEC *I*
40.0000 mg | DELAYED_RELEASE_TABLET | Freq: Two times a day (BID) | ORAL | 5 refills | Status: DC
Start: 2017-09-30 — End: 2017-12-08

## 2017-09-30 NOTE — Telephone Encounter (Signed)
Pt is scheduled and notified for 12.11    Pt has been added to your schedule at 3pm today

## 2017-09-30 NOTE — H&P (Signed)
Kaijah Tsukamoto  [id number]     09/30/2017  Song, Soonil, MD      Chief Complaint/Reason for Office Visit: abdominal pain    I had the pleasure of seeing your patient, Felicie Hofacker, in the outpatient gastroenterology/hepatology clinic. As you know, she is a 41 y.o. female with a past medical history significant for anemia, anxiety, arthritis, depression, fibromyalgia, GERD, HTN, who is referred to our office for evaluation of abdominal pain.     She state she developed epigastric pain approximately 2 months ago. Pain is worse after PO intake. Some radiation up into the chest. She had some abdominal discomfort over the last 1 year, following a colonoscopy, however this is a new discomfort. She feels like she feels like she has an ulcer. Last week, she had nausea/vomiting after eating steak. She has not had vomiting otherwise.    In 2010, she states she had a procedure where they went through her groin which diagnosed an ulcer. She cannot tell me any further details. She has advanced scoliosis and has chronic right-sided abdominal pain but again this discomfort is different. She also c/o 30 lb weight loss.     She was seen in the ED. C. Difficile and H pylori stool Ag are negative.     She has chronic loose stools. She moves her bowels once daily. This is baseline for her.     She has been on omeprazole "for a while". She started taking it twice daily, no improvement yet. Feels "full of acid".     Last colonoscopy 08/07/2016 - no masses or polyps.         Allergies/Sensitivities:No Known Allergies (drug, envir, food or latex)    Medications:   Prior to Admission medications    Medication Sig Start Date End Date Taking? Authorizing Provider   Medical marijuana as needed    [provider]   ondansetron (ZOFRAN-ODT) 4 MG disintegrating tablet Take 1 tablet (4 mg total) by mouth 3 times daily as needed   Place on top of tongue. 04/28/17   Nelson, Rachel Anne, MD   oxyCODONE-acetaminophen (PERCOCET) 5-325 MG per tablet  Take 1 tablet by mouth every 6 hours as needed   Max daily dose: 4 tablets 09/17/16   Kleehammer, Daniel G, PA   meloxicam (MOBIC) 15 MG tablet Take 15 mg by mouth daily   Take with food.    [provider]   hydrOXYzine HCl (ATARAX) 10 MG tablet Take 10 mg by mouth 3 times daily as needed       [provider]   mirtazapine (REMERON) 15 MG tablet Take 15 mg by mouth nightly       [provider]   gabapentin (GABAPENTIN) 800MG tablet Take 800 mg by mouth nightly       [provider]   lisinopril-hydrochlorothiazide (PRINZIDE,ZESTORETIC) 10-12.5 MG per tablet Take 1 tablet by mouth    [provider]   DULoxetine (CYMBALTA) 20 MG DR capsule Take 40 mg by mouth    [provider]   pregabalin (LYRICA) 150 MG capsule Take 150 mg by mouth 2 times daily    [provider]   omeprazole (PRILOSEC) 40 MG capsule Take 1 capsule (40 mg total) by mouth daily 01/12/16   Lishyjek, Karolina, MD   amLODIPine (NORVASC) 10 MG tablet Take 1 tablet (10 mg total) by mouth daily 01/12/16   Lishyjek, Karolina, MD   ferrous sulfate 325 (65 FE) MG tablet Take 1 tablet (  325 mg total) by mouth daily (with breakfast) 01/12/16   Lishyjek, Karolina, MD       Past Medical Hx:   Past Medical History:   Diagnosis Date   • Anemia    • Anxiety 01/12/2016   • Arthritis    • Depression    • DJD (degenerative joint disease), lumbar     small disc disease   • DUB (dysfunctional uterine bleeding)    • Fibromyalgia 01/12/2016   • GERD (gastroesophageal reflux disease)    • High blood pressure    • Hypertension    • Nicotine dependence 01/12/2016   • Peptic ulcer    • Sciatica of right side    • Sleep apnea        Past Surgical Hx:   Past Surgical History:   Procedure Laterality Date   • ANTERIOR CRUCIATE LIGAMENT REPAIR Right 01/24/2015   • CESAREAN SECTION, LOW TRANSVERSE  2007   • CHOLECYSTECTOMY  1996   • COLONOSCOPY     • PR ARTHRS KNE SURG W/MENISCECTOMY MED/LAT W/SHVG Left 09/11/2016     Procedure: LEFT KNEE ARTHROSCOPY WITH PARTIAL LATERAL MENISCECTOMY, CHONDROPLASTY    ;  Surgeon: Giordano, Brian D, MD;  Location: [facility];  Service: Orthopedics       Social Hx:   Social History   Substance Use Topics   • Smoking status: Current Every Day Smoker     Packs/day: 0.50   • Smokeless tobacco: Never Used   • Alcohol use No       Family Hx: History reviewed. Father had colon cancer at age 42. No pertinent family history of IBD, liver disease, pancreatic disease, or celiac disease.    ROS:   General:  No malaise, fatigue, fever, chills, sweats.  HEENT: No hearing loss, visual changes.   Cardiovascular:  No chest pain, palpitations.  Respiratory:  No dyspnea, cough.  Musculoskeletal:  No myalgias.  GI:  See above.  GU:  No dysuria, hematuria.  Skin:  No rash, pruritus, jaundice.  Neuro:   No focal numbness, weakness, tremor.  Psychiatric:  No confusion, depression.  Endocrine:  No heat or cold intolerance.  Heme/Lymph:  No easy bruising/bleeding.  No concerning lumps.  Allergy/Rheum:  No arthralgias/arthritis.  No rash/hives.     Vitals:   Vitals:    09/30/17 1524   BP: (!) 182/91   Pulse: 77   Weight: 70.4 kg (155 lb 1.6 oz)   Height: 172.7 cm (5' 8")     Body mass index is 23.58 kg/m².    Physical Exam:   General: NAD. Appears stated age.   Skin:  No rashes, jaundice.  Warm and dry.   Lymphatics:  No peripheral adenopathy palpated in SCF or cervical chains.    HEENT:  No icterus.  No oropharyngeal abnormalities.    Neck:  No masses or tracheal deviation.  Supple.  Lungs:  Clear bilaterally to auscultation. Normal respiratory effort.    Cor:  RRR without murmur.    Abdomen:  Normal bowel sounds, no bruits.  Non-distended, no HSM appreciated. TTP in the epigastrium without guarding or rebound tenderness.   Extremities:  Warm, no edema.   Neuro:  Alert and oriented x 3 with appropriate affect.  Ambulatory.  No gross motor deficits noted.        Labs/Imaging:   09/26/2017 12:04 PM    ABDOMEN AP SINGLE  VIEW/KUB     ORDERING CLINICAL INFORMATION: Abdominal Pain; R10.84-Generalized Abdominal Pain    ADDITIONAL CLINICAL INFORMATION: None.     COMPARISON: None.     PROCEDURE: Frontal view of the abdomen.     FINDINGS: There is a mild amount of air and stool projecting over the expected location of the colon and rectum. The bowel gas pattern is nonspecific. There is no evidence to suggest bowel obstruction. There are multiple radiopaque clips projecting over   the medial abdomen.     The lowest square vertebral body is arbitrarily labeled L5 for the purposes of this study. There are no acute osseous abnormalities identified. There is a severe right convexity scoliosis centered at T12-L1. There are arthritic changes at the pubic   symphysis.     IMPRESSION:     1. Nonspecific bowel gas pattern.     2. There is a mild amount of air and stool projecting over the expected location of the colon and rectum.      04/27/2017 10:48 PM    CT ABDOMEN AND PELVIS WITH CONTRAST     REASON FOR STUDY:  Llq Pain, Suspect Diverticulitis ;    DEMOGRAPHICS:  Shatara Oyen is a 40 years Female.   ADDITIONAL CLINICAL INFORMATION:   Nausea and vomiting since yesterday.       COMPARISON:  None.      PROCEDURE:  Contiguous images were obtained through the abdomen and pelvis with intravenous contrast. Automated exposure control, adjustment of the mA and/or kV according to patient size, and/or iterative reconstruction techniques were utilized for   radiation dose optimization.  Amount and type of contrast that was injected and/or discarded is recorded in the electronic medical record.     FINDINGS ABDOMEN:     Cholecystectomy with mild associated biliary duct dilatation. 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 Scoliosis. Small left renal cyst.     Pancreas, adrenals normal. No lymphadenopathy. Normal appendix. No bowel obstruction or inflammation.     Lung bases clear.  Probable degenerative sclerosis at the right T12 costovertebral junction.     FINDINGS PELVIS:     Uterus, ovaries, bladder unremarkable. Small amount of free fluid in the pelvis, within physiologic. No pelvic lymphadenopathy. No bowel wall thickening or inflammation. No concerning osseous lesion.     IMPRESSION:     No bowel obstruction or inflammation. Normal appendix.      Ref. Range 09/24/2017 22:53 09/26/2017 11:55   Sodium Latest Ref Range: 133 - 145 mmol/L 137    Potassium Latest Ref Range: 3.3 - 5.1 mmol/L 3.8    Chloride Latest Ref Range: 96 - 108 mmol/L 101    CO2 Latest Ref Range: 20 - 28 mmol/L 23    Anion Gap Latest Ref Range: 7 - 16  13    UN Latest Ref Range: 6 - 20 mg/dL 13    Creatinine Latest Ref Range: 0.51 - 0.95 mg/dL 0.58    GFR,Black Latest Units: * 131    GFR,Caucasian Latest Units: * 114    Glucose Latest Ref Range: 60 - 99 mg/dL 103 (H)    Calcium Latest Ref Range: 8.8 - 10.2 mg/dL 8.9    Total Protein Latest Ref Range: 6.3 - 7.7 g/dL 7.2 7.4   Albumin Latest Ref Range: 3.5 - 5.2 g/dL 4.2 4.5   Globulin Latest Ref Range: 2.7 - 4.3 g/dL 3.0    ALT Latest Ref Range: 0 - 35 U/L 7 5   AST Latest Ref Range: 0 - 35 U/L 20 16   Alk Phos Latest Ref Range: 35 - 105 U/L 51 55   Amylase Latest   Ref Range: 28 - 100 U/L 61    Bilirubin,Direct Latest Ref Range: 0.0 - 0.3 mg/dL <0.2 <0.2   Bilirubin,Total Latest Ref Range: 0.0 - 1.2 mg/dL <0.2 0.3   Lipase Latest Ref Range: 13 - 60 U/L 19    BHCG Latest Ref Range: 0 - 1 mIU/mL <1    WBC Latest Ref Range: 4.0 - 10.0 THOU/uL 10.1 (H)    RBC Latest Ref Range: 3.9 - 5.2 MIL/uL 3.6 (L)    Hemoglobin Latest Ref Range: 11.2 - 15.7 g/dL 10.1 (L)    Hematocrit Latest Ref Range: 34 - 45 % 30 (L)    MCV Latest Ref Range: 79 - 95 fL 83    MCH Latest Ref Range: 26 - 32 pg 28    MCHC Latest Ref Range: 32 - 36 g/dL 33    RDW Latest Ref Range: 11.7 - 14.4 % 17.1 (H)    Platelets Latest Ref Range: 160 - 370 THOU/uL 277    Neut # K/uL Latest Ref Range: 1.6 - 6.1 THOU/uL 8.2  (H)    Lymph # K/uL Latest Ref Range: 1.2 - 3.7 THOU/uL 1.5    Mono # K/uL Latest Ref Range: 0.2 - 0.9 THOU/uL 0.4    Eos # K/uL Latest Ref Range: 0.0 - 0.4 THOU/uL 0.0    Baso # K/uL Latest Ref Range: 0.0 - 0.1 THOU/uL 0.0    IMM Granulocytes # Latest Ref Range: 0.0 - 0.1 THOU/uL 0.0    Nucl RBC # K/uL Latest Ref Range: 0.0 - 0.0 THOU/uL 0.0    Seg Neut % Latest Units: % 80.7    Lymphocyte % Latest Units: % 14.6    Monocyte % Latest Units: % 3.9    Eosinophil % Latest Units: % 0.1    Basophil % Latest Units: % 0.3    IMM Granulocytes Latest Units: % 0.4    Nucl RBC % Latest Ref Range: 0.0 - 0.2 /100 WBC 0.0        Impression(s)/Recommendation(s):   41 y.o. female with a past medical history significant for anemia, anxiety, arthritis, depression, fibromyalgia, GERD, HTN, who is referred to our office for evaluation of epigastric abdominal pain, worse with PO intake. Presentation is suggestive of GERD, PUD, gastritis. Recent labs do not suggest acute pancreatitis. CT scan in 04/2017 with mild biliary dilatation (pt is s/p cholecystectomy), normal appearing pancreas. She reports 30lb weight loss secondary to reduced PO intake d/t abd pain.    - Will schedule abdominal ultrasound to evaluate CBD, r/o CBD stones though this is less likely given normal LFTs.   - Maximize acid therapy - change omeprazole to pantoprazole 40mg BID. Ok to take ranitidine at bedtime. Add sucralfate 1gm BID before meals.   - Educated on avoidance of NSAIDs and alcohol. Avoid dietary triggers.   - Will schedule EGD for further evaluation. Discussed risks and benefits of this procedure. She states she was uncomfortable during last colonoscopy and will require general anesthesia. Last colonoscopy was done at [facility] - report reviewed. Advised patient GA slots are more limited and recommend trial of moderate sedation (pt received 7mg versed and 100mcg fentanyl during last colonoscopy). Pt declined therefore we will schedule EGD with GA - first  available.   - F/U 2-4 weeks after EGD, or earlier PRN.   - [facility] - due for screening colonoscopy in 2022 (5 year interval d/t family history of colon cancer in [place])    Thank you for allowing us to   participate in this patient's care.  Please do not hesitate to contact us with any questions or concerns at [phone].      Luanna Weesner M Rhapsody Wolven, PA

## 2017-09-30 NOTE — Telephone Encounter (Signed)
Jeannie of [facility] is calling and states a PA is needed for sucralfate (CARAFATE) 1 GM/10ML suspension (Order #[id number]) on 09/30/17. If needed, she can be reached back at [phone]

## 2017-09-30 NOTE — Telephone Encounter (Signed)
Needs EGD with GA ASAP any provider/4wk fuv after.

## 2017-09-30 NOTE — Discharge Instructions (Signed)
Stop omeprazole.   Start pantoprazole 40mg  2x/day - take this on an empty stomach at least 30 minutes prior to breakfast and prior to dinner.   You can take Zantac (ranitidine) at bedtime.   Also start sucralfate - this is a liquid. Take this 2x/day prior to meals. Make sure you separate this from your other mediactions!

## 2017-10-03 ENCOUNTER — Encounter: Payer: Self-pay | Admitting: Gastroenterology

## 2017-10-03 ENCOUNTER — Other Ambulatory Visit: Payer: Self-pay | Admitting: Gastroenterology

## 2017-10-03 MED ORDER — SUCRALFATE 1 GM PO TABS *I*
1.0000 g | ORAL_TABLET | Freq: Two times a day (BID) | ORAL | 2 refills | Status: DC
Start: 2017-10-03 — End: 2017-12-08

## 2017-10-03 NOTE — Telephone Encounter (Signed)
Added to list/mailed letter   Spoke with pt   We are currently trying to fit the patient into the schedule as soon as we have a spot we will be in touch

## 2017-10-03 NOTE — Telephone Encounter (Signed)
Changed carafate to tablets which are covered by her insurance plan. Pt notified.

## 2017-10-03 NOTE — Telephone Encounter (Signed)
The pt is wondering if someone can call her back as she is unable to get the Carafate and would like to discuss an alternative

## 2017-10-06 ENCOUNTER — Telehealth: Payer: Self-pay | Admitting: Gastroenterology

## 2017-10-06 ENCOUNTER — Telehealth: Payer: Self-pay

## 2017-10-06 ENCOUNTER — Encounter: Payer: Self-pay | Admitting: Anesthesiology

## 2017-10-06 NOTE — Telephone Encounter (Signed)
CONFIRMED 12.18 at 630am transportation has been set up

## 2017-10-06 NOTE — Telephone Encounter (Signed)
Pre Procedure screening call made. Spoke with patient regarding arrival time, need for a driver home and NPO after MN.

## 2017-10-06 NOTE — Anesthesia Preprocedure Evaluation (Addendum)
Anesthesia Pre-operative History and Physical for [name]    Highlighted Issues for this Procedure:  41 yo F presenting for EGD with Dr Ronalee Red.    PMH: HTN, OSA, depression, scoliosis, DJD.  NKDA    Patient underwent knee chondroplasty 06/2016 with LMA and had 2 episodes of laryngospasm, broke with sux.        .  Anesthesia Evaluation Information Source: records, patient     ANESTHESIA HISTORY  Pertinent(-):  No History of anesthetic complications or Family hx of anesthetic complications    GENERAL  Pertinent (-):  No obesity, communication issues or infection    HEENT  Pertinent (-):  No TMJD, anatomic issues or neck pain PULMONARY    + Asthma          moderate persistent    + Sleep apnea          CPAP  Pertinent(-):  No smoking, recent URI, cough/congestion or COPD    CARDIOVASCULAR  Good(4+METs) Exercise Tolerance    + Hypertension          well controlled  Pertinent(-):  No past MI, angina, CAD, anticoagulants/antiplatelet medications, dysrhythmias, orthopnea or hx of DVT    GI/HEPATIC/RENAL  Last PO Intake: >8hr before procedure    + GERD          poorly controlled    + Hx of PUD    + Nausea          chronic  Pertinent(-):  No vomiting NEURO/PSYCH    + Headaches          migraines    + Chronic pain          fibromyalgia    + Psychiatric Issues          depression    + Cerebrovascular event          TIA  Pertinent(-):  No syncope, seizures, peripheral nerve issue, gait/mobility issues or positioning issues    ENDO/OTHER  Pertinent(-):  No diabetes mellitus, thyroid disease, hormone use, steroid use, menstruating    HEMATOLOGIC  Pertinent(-):  No coagulopathy, anticoagulants/antiplatelet medications, blood transfusion or arthritis       Physical Exam    Airway            Mouth opening: normal            Mallampati: I            TM distance (fb): >3 FB            Neck ROM: full  Dental    Upper: edentulous Lower: edentulous   Cardiovascular           Rhythm: regular           Rate: normal  No friction rub or  murmur         Pulmonary     No cough, rhonchi, wheezes    Mental Status     oriented to person, place and time         ________________________________________________________________________  PLAN  ASA Score  2  Anesthetic Plan MAC     General Anesthesia/Sedation Maintenance Plan (propofol infusion); Airway (nasal cannula); Line ( use current access); Monitoring (standard ASA); Positioning (lateral decubitus); Pain (per surgical team); PostOp (PACU)    Informed Consent     Risks:          Risks discussed were commensurate with the plan listed above with the following specific points: N/V, aspiration, sore throat, hypotension, headache,  fatigue, dizziness, unsteadiness and emergence delirium , damage to:(eyes, nerves, teeth, blood vessels), awareness, unexpected serious injury, allergic Rx    Anesthetic Consent:         Anesthetic plan (and risks as noted above) were discussed with patient and spouse    Plan also discussed with team members including:       proceduralist    Attending Attestation:  As the primary attending anesthesiologist, I attest that the patient or proxy understands and accepts the risks and benefits of the anesthesia plan. I also attest that I have personally performed a pre-anesthetic examination and evaluation, and prescribed the anesthetic plan for this particular location within 48 hours prior to the anesthetic as documented. Alphonzo Grieve, MD 7:25 AM

## 2017-10-07 ENCOUNTER — Ambulatory Visit: Payer: Medicaid (Managed Care) | Admitting: Anesthesiology

## 2017-10-07 ENCOUNTER — Encounter: Payer: Self-pay | Admitting: Gastroenterology

## 2017-10-07 ENCOUNTER — Ambulatory Visit
Admission: RE | Admit: 2017-10-07 | Discharge: 2017-10-07 | Disposition: A | Discharge status: Home / Self Care | Payer: Medicaid (Managed Care) | Source: Ambulatory Visit | Attending: Gastroenterology | Admitting: Gastroenterology

## 2017-10-07 ENCOUNTER — Telehealth: Payer: Self-pay

## 2017-10-07 ENCOUNTER — Encounter: Payer: Self-pay | Admitting: Anesthesiology

## 2017-10-07 DIAGNOSIS — K2950 Unspecified chronic gastritis without bleeding: Secondary | ICD-10-CM | POA: Insufficient documentation

## 2017-10-07 DIAGNOSIS — R109 Unspecified abdominal pain: Secondary | ICD-10-CM

## 2017-10-07 LAB — POCT URINE PREGNANCY

## 2017-10-07 MED ORDER — FENTANYL CITRATE 50 MCG/ML IJ SOLN *WRAPPED*
INTRAMUSCULAR | Status: DC | PRN
Start: 2017-10-07 — End: 2017-10-07
  Administered 2017-10-07: 50 ug via INTRAVENOUS

## 2017-10-07 MED ORDER — MIDAZOLAM HCL 1 MG/ML IJ SOLN *I* WRAPPED
INTRAMUSCULAR | Status: DC | PRN
Start: 2017-10-07 — End: 2017-10-07
  Administered 2017-10-07: 2 mg via INTRAVENOUS

## 2017-10-07 MED ORDER — LACTATED RINGERS IV SOLN *I*
100.0000 mL/h | INTRAVENOUS | Status: DC
Start: 2017-10-07 — End: 2017-10-08
  Administered 2017-10-07: 100 mL/h via INTRAVENOUS

## 2017-10-07 MED ORDER — ONDANSETRON HCL 2 MG/ML IV SOLN *I*
1.0000 mg | Freq: Once | INTRAMUSCULAR | Status: AC | PRN
Start: 2017-10-07 — End: 2017-10-07

## 2017-10-07 MED ORDER — PROPOFOL 10 MG/ML IV EMUL (INTERMITTENT DOSING) WRAPPED *I*
INTRAVENOUS | Status: DC | PRN
Start: 2017-10-07 — End: 2017-10-07
  Administered 2017-10-07: 20 mg via INTRAVENOUS

## 2017-10-07 MED ORDER — PROPOFOL INFUSION 10 MG/ML *I*
INTRAVENOUS | Status: DC | PRN
Start: 2017-10-07 — End: 2017-10-07
  Administered 2017-10-07: 125 ug/kg/min via INTRAVENOUS
  Administered 2017-10-07: 150 ug/kg/min via INTRAVENOUS

## 2017-10-07 MED ORDER — LIDOCAINE HCL 2 % IJ SOLN *I*
INTRAMUSCULAR | Status: DC | PRN
Start: 2017-10-07 — End: 2017-10-07
  Administered 2017-10-07: 40 mg via INTRAVENOUS

## 2017-10-07 MED ORDER — FENTANYL CITRATE 50 MCG/ML IJ SOLN *WRAPPED*
INTRAMUSCULAR | Status: AC
Start: 2017-10-07 — End: 2017-10-07
  Filled 2017-10-07: qty 2

## 2017-10-07 MED ORDER — MIDAZOLAM HCL 1 MG/ML IJ SOLN *I* WRAPPED
INTRAMUSCULAR | Status: AC
Start: 2017-10-07 — End: 2017-10-07
  Filled 2017-10-07: qty 2

## 2017-10-07 NOTE — Progress Notes (Signed)
Per [employer], auth required for [employer] 43239, 10/07/17, pending Berkley Harveyauth #pending auth #[id number].

## 2017-10-07 NOTE — Anesthesia Procedure Notes (Signed)
---------------------------------------------------------------------------------------------------------------------------------------    AIRWAY   GENERAL INFORMATION AND STAFF    Patient location during procedure: Procedure Rm       Date of Procedure: 10/07/2017 7:52 AM  CONDITION PRIOR TO MANIPULATION     Current Airway/Neck Condition:  Normal        For more airway physical exam details, see Anesthesia PreOp Evaluation  AIRWAY METHOD     Patient Position:  Sniffing    Preoxygenated: yes      Induction: IV  Mask Difficulty Assessment:  0 - not attempted    Number of Attempts at Approach:  1    Number of Other Approaches Attempted:  0  FINAL AIRWAY DETAILS    Final Airway Type:  Nasal cannula    Head position required to avoid obstruction:  Turned to left    Insertion Site:  Left naris and right naris  ----------------------------------------------------------------------------------------------------------------------------------------

## 2017-10-07 NOTE — Interval H&P Note (Signed)
UPDATES TO PATIENT'S CONDITION on the DAY OF SURGERY/PROCEDURE    I. Updates to Patient's Condition (to be completed by a provider privileged to complete a H&P, following reassessment of the patient by the provider):    Day of Surgery/Procedure Update:  History  History reviewed and no change    Physical  Physical exam updated and no change            II. Procedure Readiness   I have reviewed the patient's H&P and updated condition. By completing and signing this form, I attest that this patient is ready for surgery/procedure.    III. Attestation   I have reviewed the updated information regarding the patient's condition and it is appropriate to proceed with the planned surgery/procedure.    Flo ShanksJonathan Arya Boxley, DO as of 7:38 AM 10/07/2017

## 2017-10-07 NOTE — H&P (View-Only) (Signed)
[name]  [id number]     09/30/2017  Glean Salvo, MD      Chief Complaint/Reason for Office Visit: abdominal pain    I had the pleasure of seeing your patient, [name], in the outpatient gastroenterology/hepatology clinic. As you know, she is a 41 y.o. female with a past medical history significant for anemia, anxiety, arthritis, depression, fibromyalgia, GERD, HTN, who is referred to our office for evaluation of abdominal pain.     She state she developed epigastric pain approximately 2 months ago. Pain is worse after PO intake. Some radiation up into the chest. She had some abdominal discomfort over the last 1 year, following a colonoscopy, however this is a new discomfort. She feels like she feels like she has an ulcer. Last week, she had nausea/vomiting after eating steak. She has not had vomiting otherwise.    In 2010, she states she had a procedure where they went through her groin which diagnosed an ulcer. She cannot tell me any further details. She has advanced scoliosis and has chronic right-sided abdominal pain but again this discomfort is different. She also c/o 30 lb weight loss.     She was seen in the ED. C. Difficile and H pylori stool Ag are negative.     She has chronic loose stools. She moves her bowels once daily. This is baseline for her.     She has been on omeprazole "for a while". She started taking it twice daily, no improvement yet. Feels "full of acid".     Last colonoscopy 08/07/2016 - no masses or polyps.         Allergies/Sensitivities:No Known Allergies (drug, envir, food or latex)    Medications:   Prior to Admission medications    Medication Sig Start Date End Date Taking? Authorizing Provider   Medical marijuana as needed    [provider]   ondansetron (ZOFRAN-ODT) 4 MG disintegrating tablet Take 1 tablet (4 mg total) by mouth 3 times daily as needed   Place on top of tongue. 04/28/17   Tonia Brooms, MD   oxyCODONE-acetaminophen (PERCOCET) 5-325 MG per tablet  Take 1 tablet by mouth every 6 hours as needed   Max daily dose: 4 tablets 09/17/16   Pryor Ochoa, PA   meloxicam (MOBIC) 15 MG tablet Take 15 mg by mouth daily   Take with food.    [provider]   hydrOXYzine HCl (ATARAX) 10 MG tablet Take 10 mg by mouth 3 times daily as needed       [provider]   mirtazapine (REMERON) 15 MG tablet Take 15 mg by mouth nightly       [provider]   gabapentin (GABAPENTIN) 800MG tablet Take 800 mg by mouth nightly       [provider]   lisinopril-hydrochlorothiazide (PRINZIDE,ZESTORETIC) 10-12.5 MG per tablet Take 1 tablet by mouth    [provider]   DULoxetine (CYMBALTA) 20 MG DR capsule Take 40 mg by mouth    [provider]   pregabalin (LYRICA) 150 MG capsule Take 150 mg by mouth 2 times daily    [provider]   omeprazole (PRILOSEC) 40 MG capsule Take 1 capsule (40 mg total) by mouth daily 01/12/16   Juluis Pitch, MD   amLODIPine (NORVASC) 10 MG tablet Take 1 tablet (10 mg total) by mouth daily 01/12/16   Juluis Pitch, MD   ferrous sulfate 325 (65 FE) MG tablet Take 1 tablet (  325 mg total) by mouth daily (with breakfast) 01/12/16   Juluis Pitch, MD       Past Medical Hx:   Past Medical History:   Diagnosis Date    Anemia     Anxiety 01/12/2016    Arthritis     Depression     DJD (degenerative joint disease), lumbar     small disc disease    DUB (dysfunctional uterine bleeding)     Fibromyalgia 01/12/2016    GERD (gastroesophageal reflux disease)     High blood pressure     Hypertension     Nicotine dependence 01/12/2016    Peptic ulcer     Sciatica of right side     Sleep apnea        Past Surgical Hx:   Past Surgical History:   Procedure Laterality Date    ANTERIOR CRUCIATE LIGAMENT REPAIR Right 01/24/2015    CESAREAN SECTION, LOW TRANSVERSE  2007    CHOLECYSTECTOMY  1996    COLONOSCOPY      PR ARTHRS KNE SURG W/MENISCECTOMY MED/LAT W/SHVG Left 09/11/2016     Procedure: LEFT KNEE ARTHROSCOPY WITH PARTIAL LATERAL MENISCECTOMY, CHONDROPLASTY    ;  Surgeon: Fran Lowes, MD;  Location: [facility];  Service: Orthopedics       Social Hx:   Social History   Substance Use Topics    Smoking status: Current Every Day Smoker     Packs/day: 0.50    Smokeless tobacco: Never Used    Alcohol use No       Family Hx: History reviewed. Father had colon cancer at age 64. No pertinent family history of IBD, liver disease, pancreatic disease, or celiac disease.    ROS:   General:  No malaise, fatigue, fever, chills, sweats.  HEENT: No hearing loss, visual changes.   Cardiovascular:  No chest pain, palpitations.  Respiratory:  No dyspnea, cough.  Musculoskeletal:  No myalgias.  GI:  See above.  GU:  No dysuria, hematuria.  Skin:  No rash, pruritus, jaundice.  Neuro:   No focal numbness, weakness, tremor.  Psychiatric:  No confusion, depression.  Endocrine:  No heat or cold intolerance.  Heme/Lymph:  No easy bruising/bleeding.  No concerning lumps.  Allergy/Rheum:  No arthralgias/arthritis.  No rash/hives.     Vitals:   Vitals:    09/30/17 1524   BP: (!) 182/91   Pulse: 77   Weight: 70.4 kg (155 lb 1.6 oz)   Height: 172.7 cm (_0 )     Body mass index is 23.58 kg/m.    Physical Exam:   General: NAD. Appears stated age.   Skin:  No rashes, jaundice.  Warm and dry.   Lymphatics:  No peripheral adenopathy palpated in SCF or cervical chains.    HEENT:  No icterus.  No oropharyngeal abnormalities.    Neck:  No masses or tracheal deviation.  Supple.  Lungs:  Clear bilaterally to auscultation. Normal respiratory effort.    Cor:  RRR without murmur.    Abdomen:  Normal bowel sounds, no bruits.  Non-distended, no HSM appreciated. TTP in the epigastrium without guarding or rebound tenderness.   Extremities:  Warm, no edema.   Neuro:  Alert and oriented x 3 with appropriate affect.  Ambulatory.  No gross motor deficits noted.        Labs/Imaging:   09/26/2017 12:04 PM    ABDOMEN AP SINGLE  VIEW/KUB    ORDERING CLINICAL INFORMATION: Abdominal Pain; R10.84-Generalized Abdominal Pain  ADDITIONAL CLINICAL INFORMATION: None.    COMPARISON: None.    PROCEDURE: Frontal view of the abdomen.    FINDINGS: There is a mild amount of air and stool projecting over the expected location of the colon and rectum. The bowel gas pattern is nonspecific. There is no evidence to suggest bowel obstruction. There are multiple radiopaque clips projecting over   the medial abdomen.    The lowest square vertebral body is arbitrarily labeled L5 for the purposes of this study. There are no acute osseous abnormalities identified. There is a severe right convexity scoliosis centered at T12-L1. There are arthritic changes at the pubic   symphysis.    IMPRESSION:    1. Nonspecific bowel gas pattern.    2. There is a mild amount of air and stool projecting over the expected location of the colon and rectum.      04/27/2017 10:48 PM    CT ABDOMEN AND PELVIS WITH CONTRAST    REASON FOR STUDY:  Llq Pain, Suspect Diverticulitis ;    DEMOGRAPHICS:  Ula Freda is a 41 years Female.   ADDITIONAL CLINICAL INFORMATION:   Nausea and vomiting since yesterday.      COMPARISON:  None.     PROCEDURE:  Contiguous images were obtained through the abdomen and pelvis with intravenous contrast. Automated exposure control, adjustment of the mA and/or kV according to patient size, and/or iterative reconstruction techniques were utilized for   radiation dose optimization.  Amount and type of contrast that was injected and/or discarded is recorded in the electronic medical record.    FINDINGS ABDOMEN:    Cholecystectomy with mild associated biliary duct dilatation. 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 Scoliosis. Small left renal cyst.    Pancreas, adrenals normal. No lymphadenopathy. Normal appendix. No bowel obstruction or inflammation.    Lung bases clear.  Probable degenerative sclerosis at the right T12 costovertebral junction.    FINDINGS PELVIS:    Uterus, ovaries, bladder unremarkable. Small amount of free fluid in the pelvis, within physiologic. No pelvic lymphadenopathy. No bowel wall thickening or inflammation. No concerning osseous lesion.    IMPRESSION:    No bowel obstruction or inflammation. Normal appendix.     Ref. Range 09/24/2017 22:53 09/26/2017 11:55   Sodium Latest Ref Range: 133 - 145 mmol/L 137    Potassium Latest Ref Range: 3.3 - 5.1 mmol/L 3.8    Chloride Latest Ref Range: 96 - 108 mmol/L 101    CO2 Latest Ref Range: 20 - 28 mmol/L 23    Anion Gap Latest Ref Range: 7 - 16  13    UN Latest Ref Range: 6 - 20 mg/dL 13    Creatinine Latest Ref Range: 0.51 - 0.95 mg/dL 0.58    GFR,Black Latest Units: * 131    GFR,Caucasian Latest Units: * 114    Glucose Latest Ref Range: 60 - 99 mg/dL 103 (H)    Calcium Latest Ref Range: 8.8 - 10.2 mg/dL 8.9    Total Protein Latest Ref Range: 6.3 - 7.7 g/dL 7.2 7.4   Albumin Latest Ref Range: 3.5 - 5.2 g/dL 4.2 4.5   Globulin Latest Ref Range: 2.7 - 4.3 g/dL 3.0    ALT Latest Ref Range: 0 - 35 U/L 7 5   AST Latest Ref Range: 0 - 35 U/L 20 16   Alk Phos Latest Ref Range: 35 - 105 U/L 51 55   Amylase Latest  Ref Range: 28 - 100 U/L 61    Bilirubin,Direct Latest Ref Range: 0.0 - 0.3 mg/dL <0.2 <0.2   Bilirubin,Total Latest Ref Range: 0.0 - 1.2 mg/dL <0.2 0.3   Lipase Latest Ref Range: 13 - 60 U/L 19    BHCG Latest Ref Range: 0 - 1 mIU/mL <1    WBC Latest Ref Range: 4.0 - 10.0 THOU/uL 10.1 (H)    RBC Latest Ref Range: 3.9 - 5.2 MIL/uL 3.6 (L)    Hemoglobin Latest Ref Range: 11.2 - 15.7 g/dL 10.1 (L)    Hematocrit Latest Ref Range: 34 - 45 % 30 (L)    MCV Latest Ref Range: 79 - 95 fL 83    MCH Latest Ref Range: 26 - 32 pg 28    MCHC Latest Ref Range: 32 - 36 g/dL 33    RDW Latest Ref Range: 11.7 - 14.4 % 17.1 (H)    Platelets Latest Ref Range: 160 - 370 THOU/uL 277    Neut # K/uL Latest Ref Range: 1.6 - 6.1 THOU/uL 8.2  (H)    Lymph # K/uL Latest Ref Range: 1.2 - 3.7 THOU/uL 1.5    Mono # K/uL Latest Ref Range: 0.2 - 0.9 THOU/uL 0.4    Eos # K/uL Latest Ref Range: 0.0 - 0.4 THOU/uL 0.0    Baso # K/uL Latest Ref Range: 0.0 - 0.1 THOU/uL 0.0    IMM Granulocytes # Latest Ref Range: 0.0 - 0.1 THOU/uL 0.0    Nucl RBC # K/uL Latest Ref Range: 0.0 - 0.0 THOU/uL 0.0    Seg Neut % Latest Units: % 80.7    Lymphocyte % Latest Units: % 14.6    Monocyte % Latest Units: % 3.9    Eosinophil % Latest Units: % 0.1    Basophil % Latest Units: % 0.3    IMM Granulocytes Latest Units: % 0.4    Nucl RBC % Latest Ref Range: 0.0 - 0.2 /100 WBC 0.0        Impression(s)/Recommendation(s):   41 y.o. female with a past medical history significant for anemia, anxiety, arthritis, depression, fibromyalgia, GERD, HTN, who is referred to our office for evaluation of epigastric abdominal pain, worse with PO intake. Presentation is suggestive of GERD, PUD, gastritis. Recent labs do not suggest acute pancreatitis. CT scan in 04/2017 with mild biliary dilatation (pt is s/p cholecystectomy), normal appearing pancreas. She reports 30lb weight loss secondary to reduced PO intake d/t abd pain.    - Will schedule abdominal ultrasound to evaluate CBD, r/o CBD stones though this is less likely given normal LFTs.   - Maximize acid therapy - change omeprazole to pantoprazole 50m BID. Ok to take ranitidine at bedtime. Add sucralfate 1gm BID before meals.   - Educated on avoidance of NSAIDs and alcohol. Avoid dietary triggers.   - Will schedule EGD for further evaluation. Discussed risks and benefits of this procedure. She states she was uncomfortable during last colonoscopy and will require general anesthesia. [facility] report reviewed. Advised patient GA slots are more limited and recommend trial of moderate sedation (pt received 762mversed and 10063mfentanyl during last colonoscopy). Pt declined therefore we will schedule EGD with GA - first  available.   - F/U 2-4 weeks after EGD, or earlier PRN.   - [facility] - due for screening colonoscopy in 2022 (5 year interval [facility]  Thank you for allowing us [place]  participate in this patient's care.  Please do not hesitate to contact us with any questions or concerns at [phone].      [facility] Karl Bales, PA

## 2017-10-07 NOTE — Discharge Instructions (Signed)
GASTROENTEROLOGY UNIT  DISCHARGE INSTRUCTIONS FOR GASTROSCOPY      10/07/2017                7:39 AM    EGD and Biopsies taken     Do not drive, operate heavy machinery, drink alcoholic beverages, make important personal or business decisions, or sign legal documents until the next day.    Return to your usual medications   Return to your usual diet    Things You May Expect:   A mild sore throat (Warm liquids or lozenges will soothe the throat)   You were given medication to help you relax during the test. You need to rest at home for at least 4-6 hours.    You Should Call Your Doctor For Any of the Following:   Fever, abdominal or chest pain that does not go away   Cough or trouble breathing   Bloody or black stools.   Pain or redness at the IV site    If you have a serious problem after hours.  Call [phone] to reach the GI physician on call.    If you are unable to reach your doctor, [facility].    Follow Up Care:   If biopsies were taken during your procedure, we will send you the pathology results within 7-10 days. If you do not receive your pathology results after 10 days please call [phone]   Report will be sent to your primary doctor.   Follow-up in GI clinic with Charlynn GrimesSarah Enslin, PA as scheduled.    New Prescriptions    No medications on file

## 2017-10-07 NOTE — Anesthesia Postprocedure Evaluation (Signed)
Anesthesia Post-Op Note    Patient: [name]    Procedure(s) Performed:  Procedure Summary  Date:  10/07/2017 Anesthesia Start: 10/07/2017  7:41 AM Anesthesia Stop: 10/07/2017  8:02 AM Room / Location:  * No operating room entered * [facility]   * No procedures listed * Diagnosis:  * No pre-op diagnosis entered * * No surgeons listed * Attending Anesthesiologist:  Bettina GaviaMCELROY, Deundra Bard ANN, MD         Recovery Vitals  BP: 110/75 (10/07/2017  8:10 AM)  Heart Rate: 66 (10/07/2017  8:10 AM)  Resp: 16 (10/07/2017  8:10 AM)  Temp: 36.4 C (97.5 F) (10/07/2017  8:01 AM)  SpO2: 100 % (10/07/2017  8:10 AM)   0-10 Scale: 8 (10/07/2017  7:04 AM)  Anesthesia type:  Monitor Anesthesia Care  Complications Noted During Procedure or in PACU:  None   Comment:    Patient Location:  PACU  Level of Consciousness:    Recovered to baseline  Patient Participation:     Able to participate  Temperature Status:    Normothermic  Oxygen Saturation:    Within patient's normal range  Cardiac Status:   Within patient's normal range and stable  Fluid Status:    Stable  Airway Patency:     Yes  Pulmonary Status:    Baseline  Pain Management:    Adequate analgesia  Nausea and Vomiting:  None    Post Op Assessment:    No evidence of recall and tolerated procedure well   Attending Attestation:  All indicated post anesthesia care provided     -

## 2017-10-07 NOTE — Anesthesia Case Conclusion (Signed)
CASE CONCLUSION  Emergence  Criteria Used for Airway Removal:  Adequate Tv & RR and acceptable O2 saturation  Assessment:  Routine  Transport  Directly to: PACU  Position:  Recumbent  Patient Condition on Handoff  Level of Consciousness:  Mildly sedated  Patient Condition:  Stable  Handoff Report to:  RN

## 2017-10-07 NOTE — Procedures (Addendum)
EGD Procedure Note   Date of Procedure: 10/07/2017    Referring Physician: Charlynn GrimesSarah Enslin, PA  Primary Care Provider: Leola BrazilSong, Soonil, MD   Attending Physician: Flo ShanksJonathan Ritisha Deitrick, DO  Fellow: None  Indication(s): Abdominal pain    Medications: Given by Anesthesia    Endoscope: [id number]  Accessories:Biopsy forceps: Regular cold biopsy     Procedure Description: Full disclosure of risks were reviewed with patient as detailed on the consent form. The patient was placed in the left lateral decubitus position and monitored with continuous pulse oximetry, interval blood pressure monitoring and direct observations.   After adequate sedation, the endoscope was carefully introduced into the oropharynx and passed in to the esophagus under direct visualization. The esophagus and GE junction were carefully examined. After advancement of the endoscope into the stomach, a careful examination was performed, including views of the antrum, incisura angularis, corpus and retroflexed views of the cardia and fundus.   The pylorus was then intubated without any difficulty and the endoscope was advanced to the second portion of the duodenum. Careful examination of the second portion of the duodenum and the bulb was then performed.  The stomach was then decompressed and the endoscope withdrawn.   Findings and intervention(s) are detailed below. The patient was recovered in the GI recovery area    Findings:     Esophagus:   Normal      Stomach:   Mild antral erythema. No ulcers or erosions    S/p distal and proximal biopsies      Duodenum/small bowel:   Normal    Intervention(s):   Cytology/biopsy: Biopsies taken of stomach    Complication(s):   none    Impression(s):  1. Mild gastric antral erythema  2. S/p gastric biopsies as above    Recommendation(s):   Follow-up in GI clinic with Charlynn GrimesSarah Enslin, PA as scheduled   Follow-up with PCP    Histopathologic Diagnosis:  Stomach, biopsy:   - Antral and oxyntic mucosa with mild chronic  gastritis, inactive.   - No Helicobacter organisms identified on H&E-stained sections.     Flo ShanksJonathan Hudsen Fei, DO

## 2017-10-07 NOTE — Telephone Encounter (Signed)
Patient will call to schedule fu appt

## 2017-10-08 ENCOUNTER — Telehealth: Payer: Self-pay

## 2017-10-08 NOTE — Telephone Encounter (Signed)
[name] from [employer] is calling regarding a prior authorization. She stated that she needs additional information. She can be reached at [phone].

## 2017-10-09 LAB — SURGICAL PATHOLOGY

## 2017-10-09 NOTE — Telephone Encounter (Signed)
[name], [facility], returning call to CliftonRobyn. Approval [id number], CPT Code [id number] valid 10/07/17 to 01/05/18 due to provider lacking [facility] privileges.

## 2017-10-09 NOTE — Telephone Encounter (Signed)
[name] with [facility] is calling to speak with [name] SchoolsRobyn to get additional information needed for PA. She would like to know if Doctor has any privileges at any local ambulatory centers. She stated she did fax this request and information over so once received it can be faxed back and there will be no need to contact her back.     If needed, she can be reached at [phone].

## 2017-11-06 ENCOUNTER — Other Ambulatory Visit
Admission: RE | Admit: 2017-11-06 | Discharge: 2017-11-06 | Disposition: A | Discharge status: Home / Self Care | Payer: Medicaid (Managed Care) | Source: Ambulatory Visit | Attending: General Practice | Admitting: General Practice

## 2017-11-06 DIAGNOSIS — R634 Abnormal weight loss: Secondary | ICD-10-CM | POA: Insufficient documentation

## 2017-11-07 LAB — HIV 1&2 ANTIGEN/ANTIBODY: HIV 1&2 ANTIGEN/ANTIBODY: NONREACTIVE

## 2017-11-09 LAB — TB AG T-CELL STIMULATION: TB Ag T-Cell Stimulation: 0

## 2017-12-03 ENCOUNTER — Encounter: Payer: Self-pay | Admitting: Physical Medicine and Rehabilitation

## 2017-12-08 ENCOUNTER — Other Ambulatory Visit: Payer: Self-pay | Admitting: Gastroenterology

## 2017-12-09 ENCOUNTER — Telehealth: Payer: Self-pay | Admitting: Gastroenterology

## 2017-12-09 NOTE — Telephone Encounter (Signed)
Rep from [facility] - [place], [place] - [address] is calling to state a prior authorization is needed for pantoprazole (PROTONIX) 40 MG EC tablet.     [facility] [place] [facility] [facility]  ID: [id number]

## 2018-02-25 ENCOUNTER — Encounter: Payer: Self-pay | Admitting: Physical Medicine and Rehabilitation

## 2018-02-25 ENCOUNTER — Ambulatory Visit
Payer: Medicaid Other | Attending: Physical Medicine and Rehabilitation | Admitting: Physical Medicine and Rehabilitation

## 2018-02-25 VITALS — BP 134/82 | HR 77

## 2018-02-25 DIAGNOSIS — M170 Bilateral primary osteoarthritis of knee: Secondary | ICD-10-CM

## 2018-02-25 NOTE — Progress Notes (Signed)
Chief complaint:  Knee Pain follow-up    History of Present Illness:  This is a 42 y.o. female who presents with bilateral knee pain.  Pain started August of 2017 and pain started at that time.  Had a right ACL reconstruction one year prior to that.  The patient was referred by Dr Carley Hammed.    Here today for follow-up.  Last seen several months ago.  Unfortunately since that time her insurance has changed and no longer covers viscosupplementation.    Reports burning throughout the right knee.  Also reports instability and buckling in her right knee.  Reports she has been seeing her PCP and an MRI of the right knee/lower leg was ordered (I am unable to see the order).  She plans to follow-up with Ralene Ok regarding the instability.    Exercise: biking, hep    Not currently working- on disability       Treatment History:    Physical therapy:   Yes  No  Helpful  Somewhat Helpful  Not helpful     Exercise: does home exercise program from PT  Yes  No  Helpful  Somewhat Helpful  Not helpful     Activity modification:   Yes  No  Helpful  Somewhat Helpful  Not helpful     Acetaminophen:   Yes  No  Helpful  Somewhat Helpful  Not helpful      Percocet:   Yes  No  Helpful  Somewhat Helpful  Not helpful     NSAIDs: unable to take due to ulcer   Yes  No  Helpful  Somewhat Helpful  Not helpful     Corticosteroid injections:  Made pain worse  Yes  No  Helpful  Somewhat Helpful  Not helpful     Viscosupplementation:   Yes  No  Helpful  Somewhat Helpful  Not helpful     Surgery:  Right knee ACL reconstruction 2016, bilateral knee scopes 2017  Yes  No  Helpful  Somewhat Helpful  Not helpful     Past Medical History:  Past Medical History:   Diagnosis Date    Anemia     Anxiety 01/12/2016    Arthritis     Depression     DJD (degenerative joint disease), lumbar     small disc disease    DUB (dysfunctional  uterine bleeding)     Fibromyalgia 01/12/2016    GERD (gastroesophageal reflux disease)     High blood pressure     Hypertension     Nicotine dependence 01/12/2016    Peptic ulcer     Sciatica of right side     Sleep apnea        Past Surgical History:  Past Surgical History:   Procedure Laterality Date    ANTERIOR CRUCIATE LIGAMENT REPAIR Right 01/24/2015    CESAREAN SECTION, LOW TRANSVERSE  2007    CHOLECYSTECTOMY  1996    COLONOSCOPY      PR ARTHRS KNE SURG W/MENISCECTOMY MED/LAT W/SHVG Left 09/11/2016    Procedure: LEFT KNEE ARTHROSCOPY WITH PARTIAL LATERAL MENISCECTOMY, CHONDROPLASTY    ;  Surgeon: Arvil Persons, MD;  Location: [facility];  Service: Orthopedics       Social History:  Social History     Social History    Marital status: Single     Spouse name: N/A    Number of children: N/A    Years of education: N/A     Social History Main  Topics    Smoking status: Current Every Day Smoker     Packs/day: 0.50    Smokeless tobacco: Never Used    Alcohol use No    Drug use: No    Sexual activity: Yes     Partners: Male     Birth control/ protection: Surgical     Other Topics Concern    None     Social History Narrative    None       Family History:  Family History   Problem Relation Age of Onset    Brain cancer Mother     Colon cancer Father 24    Asthma Brother        Review of Systems:  Knee pain         Current Medications:  Current Outpatient Prescriptions on File Prior to Visit   Medication Sig Dispense Refill    sucralfate (CARAFATE) 1 GM tablet TAKE 1 TABLET BY MOUTH TWO TIMES A DAY BEFORE MEALS. 60 tablet 2    pantoprazole (PROTONIX) 40 MG EC tablet TAKE 1 TABLET BY MOUTH TWO TIMES A DAY. (SWALLOW WHOLE: DO NOT CRUSH, BREAK OR CHEW) 60 tablet 5    amitriptyline (ELAVIL) 10 MG tablet       cloNIDine (CATAPRES) 0.2 MG tablet Take 0.2 mg by mouth 4 times daily      Medical marijuana as needed      ondansetron (ZOFRAN-ODT) 4 MG disintegrating tablet Take 1 tablet (4 mg  total) by mouth 3 times daily as needed   Place on top of tongue. 30 tablet 0    oxyCODONE-acetaminophen (PERCOCET) 5-325 MG per tablet Take 1 tablet by mouth every 6 hours as needed   Max daily dose: 4 tablets 20 tablet 0    meloxicam (MOBIC) 15 MG tablet Take 15 mg by mouth daily   Take with food.      hydrOXYzine HCl (ATARAX) 10 MG tablet Take 10 mg by mouth 3 times daily as needed         mirtazapine (REMERON) 15 MG tablet Take 15 mg by mouth nightly         gabapentin (GABAPENTIN)  tablet Take 800 mg by mouth nightly         lisinopril-hydrochlorothiazide (PRINZIDE,ZESTORETIC) 10-12.5 MG per tablet Take 1 tablet by mouth every morning         DULoxetine (CYMBALTA) 20 MG DR capsule Take 40 mg by mouth      pregabalin (LYRICA) 150 MG capsule Take 150 mg by mouth 2 times daily      amLODIPine (NORVASC) 10 MG tablet Take 1 tablet (10 mg total) by mouth daily 30 tablet 5    ferrous sulfate 325 (65 FE) MG tablet Take 1 tablet (325 mg total) by mouth daily (with breakfast) 100 tablet 3     No current facility-administered medications on file prior to visit.        Allergies:  No Known Allergies (drug, envir, food or latex)    Physical Exam:  Pain    02/25/18 1012   PainSc:   9   PainLoc: Knee     Blood pressure 134/82, pulse 77.  There is no height or weight on file to calculate BMI.  Taken by patient care tech    General: NAD , pleasant and cooperative  HEENT: MMM, no scleral icterus  CVS: good perfusion   Pulm: normal work of breathing   Skin: no rashes   Pysch: normal affect  MSK/neuro:   Knees appear symmetrical without any lesions, erythema or deformity.  No warmth.   No effusion.   + crepitus with passive range of motion.   +bilateral medial joint line tenderness.  +bilateral lateral joint line tenderness.    Range of motion 0-125 bilaterally. Neurovascularly intact.   Anterior drawer with some laxity noted on the right side (unclear if chronic).  Gait steady but antalgic    Assessment:  This is  a 42 y.o. female who presents with bilateral knee pain.  -Early bilateral knee osteoarthritis    Plan:  Unfortunately viscosupplementation no longer covered by her insurance.  We discussed other options including COOLIEF which she may consider if is approved by her insurance.  She plans to follow-up with Dr Carley Hammed after MRI done to discuss her instability.    Thank you for allowing [place] to take part in this patient's care and contact us if you have any questions.    Terance Hart, MD

## 2018-02-26 ENCOUNTER — Telehealth: Payer: Self-pay

## 2018-02-26 NOTE — Telephone Encounter (Signed)
5/9 - [name] called me yesterday after seeing Dr Renae Fickle.  She states Dr Renae Fickle is referring her back to [place] for consideration of surgery.  Don't see that in the notes from the visit - I do see that her insurance wont cover visco  Asheley wants to book a follow up appt in this office    P: [phone]

## 2018-03-20 ENCOUNTER — Encounter: Payer: Self-pay | Admitting: Orthopedic Surgery

## 2018-03-20 ENCOUNTER — Ambulatory Visit: Payer: Medicaid Other | Attending: Orthopedic Surgery | Admitting: Orthopedic Surgery

## 2018-03-20 VITALS — BP 128/84 | HR 69 | Ht 68.0 in | Wt 145.9 lb

## 2018-03-20 DIAGNOSIS — M25562 Pain in left knee: Secondary | ICD-10-CM

## 2018-03-20 DIAGNOSIS — M25561 Pain in right knee: Secondary | ICD-10-CM

## 2018-03-20 NOTE — Progress Notes (Signed)
[name]:   Sivertsen, Jinx  MR #:  [id number]   CSN:  [id number] DOB:  15-Jul-1976   DICTATED BY:  Arvil PersonsBrian D Byran Bilotti, MD DATE OF VISIT:  03/20/2018     INTERVAL HISTORY:  [name] is here today for followup and ongoing treatment of bilateral knee pain.  She was seen evaluated Dr. Renae FicklePaul. Viscosupplementation was recommended, but was not approved. They also discussed COOLIEF procedure for supplemental pain control. At the time of arthroscopy, she was found to have degenerative changes.  She has benefitted somewhat, but still continues to experience pain, discomfort, crepitus, grinding, and functional impairment.    PHYSICAL EXAMINATION:  Dictated separately by Ralene Okan Kleehammer, PA.    ASSESSMENT AND PLAN:  Ongoing bilateral knee pain consistent with progressive meniscal and chondral pathology.     [name] is having mechanical symptoms.  She will undergo MRI of the right knee to evaluate for progressive chondral or meniscal pathology.  She will continue activities to tolerance and home-based maintenance strategies to optimize range of motion, function, strength and endurance.  Questions were invited and answered.  Once MRI is complete, she will call for a followup over the phone.             ______________________________  Arvil PersonsBrian D Jamani Bearce, MD    BDG/MODL  DD:  03/20/2018 12:09:46  DT:  03/20/2018 13:22:11  Job #:  [id number]    cc:

## 2018-03-27 ENCOUNTER — Ambulatory Visit
Admission: RE | Admit: 2018-03-27 | Discharge: 2018-03-27 | Disposition: A | Discharge status: Home / Self Care | Payer: Medicaid (Managed Care) | Source: Ambulatory Visit

## 2018-03-27 DIAGNOSIS — M25562 Pain in left knee: Secondary | ICD-10-CM

## 2018-03-27 DIAGNOSIS — M7121 Synovial cyst of popliteal space [Baker], right knee: Secondary | ICD-10-CM

## 2018-03-27 DIAGNOSIS — M25561 Pain in right knee: Secondary | ICD-10-CM

## 2018-03-27 DIAGNOSIS — M25461 Effusion, right knee: Secondary | ICD-10-CM

## 2018-04-07 ENCOUNTER — Other Ambulatory Visit: Payer: Self-pay | Admitting: Gastroenterology

## 2018-04-08 ENCOUNTER — Telehealth: Payer: Self-pay | Admitting: Gastroenterology

## 2018-04-08 NOTE — Telephone Encounter (Signed)
Patient needs FUV with me. Please call pt to schedule.

## 2018-04-10 NOTE — Telephone Encounter (Signed)
Called patient to schedule FUV no answer and no option to leave VM. Mailed letter.

## 2018-04-17 ENCOUNTER — Ambulatory Visit: Payer: Medicaid (Managed Care) | Admitting: Physical Medicine and Rehabilitation

## 2018-04-20 NOTE — Telephone Encounter (Signed)
[name] is scheduled for an appointment on:    Date: 04/29/18  Time: 2:00 pm  Provider: Charlynn GrimesSarah Enslin, PA

## 2018-04-29 ENCOUNTER — Ambulatory Visit
Admission: RE | Admit: 2018-04-29 | Discharge: 2018-04-29 | Disposition: A | Discharge status: Home / Self Care | Payer: Medicaid (Managed Care) | Source: Ambulatory Visit | Attending: Gastroenterology | Admitting: Gastroenterology

## 2018-04-29 ENCOUNTER — Encounter: Payer: Self-pay | Admitting: Gastroenterology

## 2018-04-29 VITALS — BP 127/88 | HR 91 | Ht 68.0 in | Wt 149.0 lb

## 2018-04-29 DIAGNOSIS — K219 Gastro-esophageal reflux disease without esophagitis: Secondary | ICD-10-CM

## 2018-04-29 DIAGNOSIS — K2970 Gastritis, unspecified, without bleeding: Secondary | ICD-10-CM

## 2018-04-29 NOTE — Progress Notes (Signed)
[name]  [id number]     04/29/2018  Glean Salvo, MD      I had the pleasure of seeing your patient, [name], in the outpatient gastroenterology/hepatology clinic. As you know, she is a 43 y.o. female who presents for follow-up of abdominal pain.     She was seen for initial consultation on 09/30/2017. She underwent EGD on 10/07/2017 which revealed mild antral erythema and was otherwise normal. Biopsies with mild chronic inactive gastritis.     She has been taking pantoprazole 72m BID and sucralfate 1gm BID. She notes resolution of epigastric pain with this. Denies nausea, vomiting, fever, chills. NO heartburn, dysphagia, odynophagia.         Allergies/Sensitivities:No Known Allergies (drug, envir, food or latex)    Medications:   Prior to Admission medications    Medication Sig Start Date End Date Taking? Authorizing Provider   sucralfate (CARAFATE) 1 GM tablet TAKE 1 TABLET BY MOUTH TWO TIMES A DAY BEFORE MEALS. 04/08/18   Clary Boulais, SHolli Humbles PA   pantoprazole (PROTONIX) 40 MG EC tablet TAKE 1 TABLET BY MOUTH TWO TIMES A DAY. (SWALLOW WHOLE: DO NOT CRUSH, BREAK OR CHEW) 12/09/17   Nelsy Madonna, SHolli Humbles PA   amitriptyline (ELAVIL) 10 MG tablet  08/14/17   [provider]   cloNIDine (CATAPRES) 0.2 MG tablet Take 0.2 mg by mouth 4 times daily    [provider]   Medical marijuana as needed    [provider]   ondansetron (ZOFRAN-ODT) 4 MG disintegrating tablet Take 1 tablet (4 mg total) by mouth 3 times daily as needed   Place on top of tongue. 04/28/17   NTonia Brooms MD   oxyCODONE-acetaminophen (PERCOCET) 5-325 MG per tablet Take 1 tablet by mouth every 6 hours as needed   Max daily dose: 4 tablets 09/17/16   KPryor Ochoa PA   meloxicam (MOBIC) 15 MG tablet Take 15 mg by mouth daily   Take with food.    [provider]   hydrOXYzine HCl (ATARAX) 10 MG tablet Take 10 mg by mouth 3 times daily as needed       [provider]   mirtazapine (REMERON) 15 MG  tablet Take 15 mg by mouth nightly       [provider]   gabapentin (GABAPENTIN) 800MG tablet Take 800 mg by mouth nightly       [provider]   lisinopril-hydrochlorothiazide (PRINZIDE,ZESTORETIC) 10-12.5 MG per tablet Take 1 tablet by mouth every morning       [provider]   DULoxetine (CYMBALTA) 20 MG DR capsule Take 40 mg by mouth    [provider]   pregabalin (LYRICA) 150 MG capsule Take 150 mg by mouth 2 times daily    [provider]   amLODIPine (NORVASC) 10 MG tablet Take 1 tablet (10 mg total) by mouth daily 01/12/16   LJuluis Pitch MD   ferrous sulfate 325 (65 FE) MG tablet Take 1 tablet (325 mg total) by mouth daily (with breakfast) 01/12/16   LJuluis Pitch MD       ROS:  Gen: no fevers/chills, no weight loss  HEENT: no sore throat, no icterus, no hearing loss or visual changes  Cardiac: no CP or palpitations  Pulm: no SOB, cough, or wheezing  Abd: as per HPI  GU: no hematuria or dysuria  MSK: no joint pain or myalgias  Skin: no rashes or jaundice      Vitals:  Vitals:    04/29/18 1415   BP: 127/88   Pulse: 91   Weight: 67.6 kg (149 lb)   Height: 172.7 cm (_0 )     Body mass index is 22.66 kg/m.    Physical Exam:   General: NAD. Appears stated age.  HEENT: sclera anicteric. Oral mucosa pink and moist.   Abdomen: no distention or tenderness.       GI procedures:  EGD (10/07/2017):  Findings:     Esophagus:  ? Normal      Stomach:  ? Mild antral erythema. No ulcers or erosions   ? S/p distal and proximal biopsies      Duodenum/small bowel:   Normal    Intervention(s):   Cytology/biopsy: Biopsies taken of stomach    Complication(s):   none    Impression(s):  1. Mild gastric antral erythema  2. S/p gastric biopsies as above    Histopathologic diagnosis:  Stomach, biopsy:   - Antral and oxyntic mucosa with mild chronic gastritis, inactive.   - No Helicobacter organisms identified on H&E-stained sections.     Labs/Imaging:    Ref.  Range 09/24/2017 22:53 09/26/2017 11:55   Sodium Latest Ref Range: 133 - 145 mmol/L 137    Potassium Latest Ref Range: 3.3 - 5.1 mmol/L 3.8    Chloride Latest Ref Range: 96 - 108 mmol/L 101    CO2 Latest Ref Range: 20 - 28 mmol/L 23    Anion Gap Latest Ref Range: 7 - 16  13    UN Latest Ref Range: 6 - 20 mg/dL 13    Creatinine Latest Ref Range: 0.51 - 0.95 mg/dL 0.58    GFR,Black Latest Units: * 131    GFR,Caucasian Latest Units: * 114    Glucose Latest Ref Range: 60 - 99 mg/dL 103 (H)    Calcium Latest Ref Range: 8.8 - 10.2 mg/dL 8.9    Total Protein Latest Ref Range: 6.3 - 7.7 g/dL 7.2 7.4   Albumin Latest Ref Range: 3.5 - 5.2 g/dL 4.2 4.5   Globulin Latest Ref Range: 2.7 - 4.3 g/dL 3.0    ALT Latest Ref Range: 0 - 35 U/L 7 5   AST Latest Ref Range: 0 - 35 U/L 20 16   Alk Phos Latest Ref Range: 35 - 105 U/L 51 55   Amylase Latest Ref Range: 28 - 100 U/L 61    Bilirubin,Direct Latest Ref Range: 0.0 - 0.3 mg/dL <0.2 <0.2   Bilirubin,Total Latest Ref Range: 0.0 - 1.2 mg/dL <0.2 0.3   Lipase Latest Ref Range: 13 - 60 U/L 19    BHCG Latest Ref Range: 0 - 1 mIU/mL <1    WBC Latest Ref Range: 4.0 - 10.0 THOU/uL 10.1 (H)    RBC Latest Ref Range: 3.9 - 5.2 MIL/uL 3.6 (L)    Hemoglobin Latest Ref Range: 11.2 - 15.7 g/dL 10.1 (L)    Hematocrit Latest Ref Range: 34 - 45 % 30 (L)    MCV Latest Ref Range: 79 - 95 fL 83    MCH Latest Ref Range: 26 - 32 pg 28    MCHC Latest Ref Range: 32 - 36 g/dL 33    RDW Latest Ref Range: 11.7 - 14.4 % 17.1 (H)    Platelets Latest Ref Range: 160 - 370 THOU/uL 277          Impression(s)/Recommendation(s):   42yo female who presents for follow-up of abdominal pain secondary to GERD and gastritis.     -  Pain resolved with PPI and sucralfate.   - Continue BID PPI. Can use sucralfate PRN.   - Reviewed dietary/lifestyle modifications and encouraged adherence.   - F/U PRN.     Thank you for allowing [place] to participate in this patient's care.  Please do not hesitate to contact us with any questions or  concerns at [phone].      [facility] Karl Bales, PA

## 2018-05-15 ENCOUNTER — Ambulatory Visit: Payer: Medicaid (Managed Care) | Admitting: Physical Medicine and Rehabilitation

## 2018-07-02 ENCOUNTER — Other Ambulatory Visit: Payer: Self-pay | Admitting: General Practice

## 2018-07-28 ENCOUNTER — Ambulatory Visit: Payer: Medicaid (Managed Care) | Attending: Orthopedic Surgery | Admitting: Orthopedic Surgery

## 2018-07-28 ENCOUNTER — Encounter: Payer: Self-pay | Admitting: Orthopedic Surgery

## 2018-07-28 VITALS — BP 161/97 | HR 78 | Ht 68.0 in | Wt 155.0 lb

## 2018-07-28 DIAGNOSIS — S83231A Complex tear of medial meniscus, current injury, right knee, initial encounter: Secondary | ICD-10-CM

## 2018-07-28 DIAGNOSIS — M222X1 Patellofemoral disorders, right knee: Secondary | ICD-10-CM

## 2018-07-28 DIAGNOSIS — M25561 Pain in right knee: Secondary | ICD-10-CM

## 2018-07-28 DIAGNOSIS — G8929 Other chronic pain: Secondary | ICD-10-CM

## 2018-07-28 NOTE — Progress Notes (Signed)
[name], [name]  MR #:  [id number]   CSN:  [id number] DOB:  [birth year]   DICTATED BY:  Arvil Persons, MD DATE OF VISIT:  07/28/2018     INTERVAL HISTORY:  [name] is here today for followup and ongoing treatment of her right knee.  She continues to experience pain and mechanical symptoms.  She has undergone prior arthroscopic debridement.  Updated MRI was obtained.  She has evidence of progressive meniscal and chondral disease.  She is here to discuss options for continued treatment.    PHYSICAL EXAMINATION:  Dictated separately by Ralene Ok, physician assistant.    ASSESSMENT AND PLAN:  A 42 year old with ongoing right knee pain due to symptomatic and progressive meniscal and chondral disease.     Crystalann has exhausted conservative care.  She is not a candidate for viscosupplementation, as her insurance is disallowing this.  She also is not a candidate for Coolief.  She has intra-articular joint disease that is amenable to arthroscopic debridement.  She was offered the option of revision right knee arthroscopy, meniscectomy, chondroplasty.  Risks, benefits, alternatives, and reasonable expectations of surgery were discussed.  Prognosis, rate of rehabilitation and recovery were reviewed.  Questions were invited and answered to her satisfaction.  She will weigh her options and set up surgery at a time of her convenience.             ______________________________  Arvil Persons, MD    BDG/MODL  DD:  07/28/2018 11:00:32  DT:  07/28/2018 14:32:53  Job #:  [id number]    cc:

## 2018-07-28 NOTE — Progress Notes (Signed)
[name], [name]  MR #:  [id number]   CSN:  [id number] DOB:  [birth year]   DICTATED BY:  Chaya Jan, PA DATE OF VISIT:  07/28/2018     CHIEF COMPLAINT:  Ongoing right knee pain.    INTERVAL HISTORY:  [name] has been previously seen in our office as she had a right knee arthroscopic surgery for meniscectomy and chondroplasty back in 2017.  She did quite well from that surgery, started developing some discomfort over the anterior aspect of the knee.  She noted a lot of burning with some mechanical-type symptoms.  We gave her an intra-articular cortisone injection, it gave her good relief for a couple weeks only for the pain to return.  We referred to one of our nonoperative physicians for consideration of viscosupplementation.  Unfortunately, the viscosupplementation is not being approved by insurance.  Our nonoperative sent her back to [place] to discuss whether or not any sort of surgical option would be appropriate in her case.  A new MRI was obtained to further evaluate for any new meniscus tear and to further evaluate the deterioration of the cartilage.  She is here to review the results today. She states she continues to have pain and discomfort and mechanical-type symptoms continue.  She occasionally notices getting swollen.  She feels as though symptoms are getting worse as time is going on.    PHYSICAL EXAMINATION:  [name] is a 42 year old woman, alert and oriented x3. Pleasant mood and affect, appears in no acute distress. Skin is cool and dry to touch.  No laceration, abrasions or lesions. She can fully extend the knee to 0 degrees.  Flexion is to 120. She has crepitus with range of motion.  No pain to palpation in quadriceps patellar tendon. She does have discomfort with circumduction compression maneuvers.  Ligamentous exam is stable.  Calves are soft and nontender bilaterally.  Distal neurovascular exam is intact.    IMAGING:  MRI was personally reviewed by myself and Dr. Idamae Lusher showing  postsurgical changes of the meniscus and early degenerative changes in the patellofemoral joint. Intact cruciate and collateral ligaments with the ACL graft intact from prior surgery.  Some edema within the fat pad, artifact from the button of her ACL, which could be an intra-articular in nature and be a source of some of her pain.    ASSESSMENT AND PLAN:  [name] is a 42 year old woman here with ongoing right knee pain with mechanical type symptoms.  At this time, she has failed conservative treatment, we would like to further evaluate the knee with arthroscopic surgery to further evaluate the amount of cartilage damage within the patellofemoral joint with chondroplasty as needed.  Also, further evaluate the meniscus and see if the button from the prior ACL surgery is actually symptomatic and causing her pain as she has had a previous intra-articular cortisone injection that did give her good relief only for the pain to return and recent denial of the insurance company for viscosupplementation.  Risks, benefits, alternative treatments, possible outcomes were discussed regarding this surgery.  She states that she would like to proceed. She was seen and evaluated by Dr. Idamae Lusher today.  If she should have any further questions or concerns, feel free to contact the office.       Dictated By:  Chaya Jan, PA      ______________________________  Arvil Persons, MD    DGK/MODL  DD:  07/28/2018 10:57:24  DT:  07/28/2018 11:51:42  Job #:  [id number]    cc:
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
## 2018-07-29 ENCOUNTER — Encounter: Payer: Self-pay | Admitting: Orthopedic Surgery

## 2018-07-31 ENCOUNTER — Other Ambulatory Visit: Payer: Self-pay | Admitting: Gastroenterology

## 2018-08-06 NOTE — Anesthesia Preprocedure Evaluation (Addendum)
Anesthesia Pre-operative History and Physical for [name]    Highlighted Issues for this Procedure:  42 y.o. 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      Electrophysiology/AICD/Pacer:  Holter monitor 05/17/2016  The rhythm was sinus with heart rates varying from 61 bpm to 140, average rate 88 bpm. The minimum and maximum rate can't be correalted to any diary entry. Tachycardia represents 24% of the reading. No bradycardia was seen. No pauses were noted. Ventricular ectopy consisted of 1 PVC, one PAC was observed with no runs of SVT. No diary was returned.     Marland Kitchen  CPM Summary:  42 y.o. 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     Pmhx  - HTN on amlodipine   - COPD on umeclidinium bromide   - moderate OSA not compliant to CPAP    Patient denies wheezing or s.o.b.  - Smoker + nicotine patch   - chronic pain, degenerative changes, scoliosis on oxycodone/acetaminophen, lyrica 139m tds   - COPD on umeclidinium bromide, denies wheezing or s.o.b  - medical marijuana   - GERD/peptic ulcer disease  - depression   - BMI 23.5    Anesthesia history   - EGD 10/07/2017    MAC   - Left knee arthroscopy 09/11/2016    GA, LMA 4   - Left knee repair 06/26/2016    GA LMA converted to ETT due to laryngospasm x2, relieved by succinylcholine, thick grey secretion noted     Patient reports being not active, walks to corner store daily   CPM Chart Review onlyBy You JLeafy Ro MBBS at 8:35 AM on 08/06/2018    Anesthesia Evaluation Information Source: records      ANESTHESIA HISTORY     Denies anesthesia history  Pertinent(-):  No History of anesthetic complications    GENERAL     Denies general issues  Pertinent (-):  No obesity    HEENT     Denies HEENT issues PULMONARY    + Smoker          tobacco currently, THC currently    + COPD    + Sleep apnea          noncompliant    CARDIOVASCULAR    + Hypertension  Pertinent(-):  No past MI, cardiac testing or CABG    GI/HEPATIC/RENAL  Last PO Intake: Enter Last PO Intake in ROS/Med Hx Tab    + GERD NEURO/PSYCH    + Headaches          migraines    + Psychiatric Issues          depression    ENDO/OTHER     Denies endo issues  Pertinent(-):  No diabetes mellitus, thyroid disease    HEMATOLOGIC    + Arthritis         Physical Exam    Airway            Mouth opening: normal            Mallampati: II  TM distance (fb): >3 FB            TM distance (cm): 3            Neck ROM: full     Cardiovascular  Normal Exam        General Survey    Normal Exam   Pulmonary   Normal Exam           ________________________________________________________________________  PLAN  ASA Score  3  Anesthetic Plan general     Induction (routine IV) General Anesthesia/Sedation Maintenance Plan (inhaled agents); Airway (LMA); Line ( use current access); Monitoring (standard ASA); Positioning (supine); PONV Plan (dexamethasone, ondansetron and haloperidol); Pain (per surgical team); PostOp (PACU)    Informed Consent     Risks:         Risks discussed were commensurate with the plan listed above with the following specific points: N/V, aspiration and sore throat, Damage to: eyes, nerves and teeth, allergic Rx and unexpected serious injury.    Anesthetic Consent:         Anesthetic plan (and risks as noted above) were discussed with patient    Plan also discussed with team members including:       CRNA    Attending Attestation:  As the primary attending anesthesiologist, I attest that the patient or proxy understands and accepts the risks and benefits  of the anesthesia plan. I also attest that I have personally performed a pre-anesthetic examination and evaluation, and prescribed the anesthetic plan for this particular location within 48 hours prior to the anesthetic as documented. Sharolyn Douglas, MD 5:26 PM

## 2018-08-17 ENCOUNTER — Encounter: Payer: Self-pay | Admitting: Student in an Organized Health Care Education/Training Program

## 2018-08-17 ENCOUNTER — Encounter
Admission: RE | Disposition: A | Discharge status: Home / Self Care | Payer: Self-pay | Source: Ambulatory Visit | Attending: Orthopedic Surgery

## 2018-08-17 ENCOUNTER — Encounter: Payer: Self-pay | Admitting: Rehabilitative and Restorative Service Providers"

## 2018-08-17 ENCOUNTER — Ambulatory Visit: Payer: Medicaid (Managed Care) | Admitting: Student in an Organized Health Care Education/Training Program

## 2018-08-17 ENCOUNTER — Ambulatory Visit
Admission: RE | Admit: 2018-08-17 | Discharge: 2018-08-17 | Disposition: A | Discharge status: Home / Self Care | Payer: Medicaid (Managed Care) | Source: Ambulatory Visit | Attending: Orthopedic Surgery | Admitting: Orthopedic Surgery

## 2018-08-17 ENCOUNTER — Encounter: Payer: Self-pay | Admitting: Anesthesiology

## 2018-08-17 DIAGNOSIS — M794 Hypertrophy of (infrapatellar) fat pad: Secondary | ICD-10-CM | POA: Insufficient documentation

## 2018-08-17 DIAGNOSIS — S83281A Other tear of lateral meniscus, current injury, right knee, initial encounter: Secondary | ICD-10-CM | POA: Insufficient documentation

## 2018-08-17 DIAGNOSIS — Y929 Unspecified place or not applicable: Secondary | ICD-10-CM | POA: Insufficient documentation

## 2018-08-17 DIAGNOSIS — J449 Chronic obstructive pulmonary disease, unspecified: Secondary | ICD-10-CM | POA: Insufficient documentation

## 2018-08-17 DIAGNOSIS — M65861 Other synovitis and tenosynovitis, right lower leg: Secondary | ICD-10-CM

## 2018-08-17 DIAGNOSIS — I10 Essential (primary) hypertension: Secondary | ICD-10-CM | POA: Insufficient documentation

## 2018-08-17 DIAGNOSIS — Y939 Activity, unspecified: Secondary | ICD-10-CM | POA: Insufficient documentation

## 2018-08-17 DIAGNOSIS — Y998 Other external cause status: Secondary | ICD-10-CM | POA: Insufficient documentation

## 2018-08-17 DIAGNOSIS — M94261 Chondromalacia, right knee: Secondary | ICD-10-CM

## 2018-08-17 DIAGNOSIS — F17210 Nicotine dependence, cigarettes, uncomplicated: Secondary | ICD-10-CM | POA: Insufficient documentation

## 2018-08-17 DIAGNOSIS — W010XXA Fall on same level from slipping, tripping and stumbling without subsequent striking against object, initial encounter: Secondary | ICD-10-CM | POA: Insufficient documentation

## 2018-08-17 HISTORY — PX: PR ARTHRS KNEE DEBRIDEMENT/SHAVING ARTCLR CRTLG: 29877

## 2018-08-17 HISTORY — PX: PR ARTHROSCOPY KNEE DIAGNOSTIC W/WO SYNOVIAL BX SPX: 29870

## 2018-08-17 HISTORY — PX: PR ARTHROSCOPY KNEE SYNOVECTOMY LIMITED SPX: 29875

## 2018-08-17 LAB — POCT URINE PREGNANCY
Exp date: 20210430
Lot #: 191901
Preg Test,UR POC: NEGATIVE

## 2018-08-17 SURGERY — ARTHROSCOPY, KNEE, WITH CHONDROPLASTY
Anesthesia: General | Site: Knee | Laterality: Right | Wound class: Clean

## 2018-08-17 MED ORDER — ACETAMINOPHEN 160 MG/5 ML WRAPPED *I*
975.0000 mg | Freq: Once | Status: AC
Start: 2018-08-17 — End: 2018-08-17

## 2018-08-17 MED ORDER — CEFAZOLIN 1000 MG IN STERILE WATER 10ML SYRINGE *I*
PREFILLED_SYRINGE | INTRAVENOUS | Status: AC
Start: 2018-08-17 — End: 2018-08-17
  Filled 2018-08-17: qty 20

## 2018-08-17 MED ORDER — OXYCODONE HCL 5 MG/5ML PO SOLN *I*
10.0000 mg | Freq: Once | ORAL | Status: AC | PRN
Start: 2018-08-17 — End: 2018-08-17
  Administered 2018-08-17: 10 mg via ORAL

## 2018-08-17 MED ORDER — FENTANYL CITRATE 50 MCG/ML IJ SOLN *WRAPPED*
INTRAMUSCULAR | Status: AC
Start: 2018-08-17 — End: 2018-08-17
  Filled 2018-08-17: qty 2

## 2018-08-17 MED ORDER — LIDOCAINE HCL (PF) 1 % IJ SOLN *I*
INTRAMUSCULAR | Status: AC
Start: 2018-08-17 — End: 2018-08-17
  Filled 2018-08-17: qty 2

## 2018-08-17 MED ORDER — PREGABALIN 75 MG PO CAPS *I*
ORAL_CAPSULE | ORAL | Status: AC
Start: 2018-08-17 — End: 2018-08-17
  Filled 2018-08-17: qty 2

## 2018-08-17 MED ORDER — EPINEPHRINE 1 MG/ML IJ SOLUTION WRAPPED *I*
INTRAMUSCULAR | Status: DC | PRN
Start: 2018-08-17 — End: 2018-08-17
  Administered 2018-08-17: 1 mg

## 2018-08-17 MED ORDER — ACETAMINOPHEN 325 MG PO TABS *I*
975.0000 mg | ORAL_TABLET | Freq: Once | ORAL | Status: AC
Start: 2018-08-17 — End: 2018-08-17
  Administered 2018-08-17: 975 mg via ORAL

## 2018-08-17 MED ORDER — LIDOCAINE HCL 2 % IJ SOLN *I*
INTRAMUSCULAR | Status: DC | PRN
Start: 2018-08-17 — End: 2018-08-17
  Administered 2018-08-17: 80 mg via INTRAVENOUS

## 2018-08-17 MED ORDER — DEXAMETHASONE SODIUM PHOSPHATE 4 MG/ML INJ SOLN *WRAPPED*
INTRAMUSCULAR | Status: DC | PRN
Start: 2018-08-17 — End: 2018-08-17
  Administered 2018-08-17: 4 mg via INTRAVENOUS

## 2018-08-17 MED ORDER — ONDANSETRON HCL 2 MG/ML IV SOLN *I*
INTRAMUSCULAR | Status: DC | PRN
Start: 2018-08-17 — End: 2018-08-17
  Administered 2018-08-17: 4 mg via INTRAVENOUS

## 2018-08-17 MED ORDER — ACETAMINOPHEN 325 MG PO TABS *I*
1000.0000 mg | ORAL_TABLET | Freq: Once | ORAL | Status: DC
Start: 2018-08-17 — End: 2018-08-18

## 2018-08-17 MED ORDER — MIDAZOLAM HCL 1 MG/ML IJ SOLN *I* WRAPPED
INTRAMUSCULAR | Status: AC
Start: 2018-08-17 — End: 2018-08-17
  Filled 2018-08-17: qty 2

## 2018-08-17 MED ORDER — LIDOCAINE HCL (PF) 1 % IJ SOLN *I*
0.1000 mL | Freq: Once | INTRAMUSCULAR | Status: AC | PRN
Start: 2018-08-17 — End: 2018-08-17

## 2018-08-17 MED ORDER — LACTATED RINGERS IV SOLN *I*
20.0000 mL/h | INTRAVENOUS | Status: DC
Start: 2018-08-17 — End: 2018-08-18
  Administered 2018-08-17: 20 mL/h via INTRAVENOUS

## 2018-08-17 MED ORDER — HYDROMORPHONE HCL PF 1 MG/ML IJ SOLN *WRAPPED*
0.5000 mg | INTRAMUSCULAR | Status: DC | PRN
Start: 2018-08-17 — End: 2018-08-18

## 2018-08-17 MED ORDER — OXYCODONE HCL 5 MG/5ML PO SOLN *I*
5.0000 mg | Freq: Once | ORAL | Status: AC | PRN
Start: 2018-08-17 — End: 2018-08-17

## 2018-08-17 MED ORDER — CEFAZOLIN 1000 MG IN STERILE WATER 10ML SYRINGE *I*
2000.0000 mg | PREFILLED_SYRINGE | Freq: Once | INTRAVENOUS | Status: AC
Start: 2018-08-17 — End: 2018-08-17
  Administered 2018-08-17: 2000 mg via INTRAVENOUS

## 2018-08-17 MED ORDER — OXYCODONE HCL 5 MG/5ML PO SOLN *I*
ORAL | Status: AC
Start: 2018-08-17 — End: 2018-08-17
  Filled 2018-08-17: qty 10

## 2018-08-17 MED ORDER — EPHEDRINE 5MG/ML IN NS IV/IJ *WRAPPED*
INTRAMUSCULAR | Status: DC | PRN
Start: 2018-08-17 — End: 2018-08-17
  Administered 2018-08-17: 5 mg via INTRAVENOUS

## 2018-08-17 MED ORDER — BACITRACIN-POLYMYXIN B 500-10000 UNIT/GM EX OINT *I*
TOPICAL_OINTMENT | CUTANEOUS | Status: DC | PRN
Start: 2018-08-17 — End: 2018-08-17
  Administered 2018-08-17: 1 via TOPICAL

## 2018-08-17 MED ORDER — PROPOFOL 10 MG/ML IV EMUL (INTERMITTENT DOSING) WRAPPED *I*
INTRAVENOUS | Status: DC | PRN
Start: 2018-08-17 — End: 2018-08-17
  Administered 2018-08-17: 200 mg via INTRAVENOUS

## 2018-08-17 MED ORDER — PROMETHAZINE HCL 25 MG/ML IJ SOLN *I*
6.2500 mg | INTRAMUSCULAR | Status: DC | PRN
Start: 2018-08-17 — End: 2018-08-18

## 2018-08-17 MED ORDER — HALOPERIDOL LACTATE 5 MG/ML IJ SOLN *I*
0.5000 mg | Freq: Once | INTRAMUSCULAR | Status: AC | PRN
Start: 2018-08-17 — End: 2018-08-17

## 2018-08-17 MED ORDER — ACETAMINOPHEN 325 MG PO TABS *I*
ORAL_TABLET | ORAL | Status: AC
Start: 2018-08-17 — End: 2018-08-17
  Filled 2018-08-17: qty 3

## 2018-08-17 MED ORDER — BUPIVACAINE-EPINEPHRINE 0.25 % IJ SOLUTION *WRAPPED*
INTRAMUSCULAR | Status: DC | PRN
Start: 2018-08-17 — End: 2018-08-17
  Administered 2018-08-17: 7 mL via SUBCUTANEOUS
  Administered 2018-08-17: 23 mL via SUBCUTANEOUS

## 2018-08-17 MED ORDER — PREGABALIN 75 MG PO CAPS *I*
150.0000 mg | ORAL_CAPSULE | Freq: Every day | ORAL | Status: AC
Start: 2018-08-17 — End: 2018-08-17
  Administered 2018-08-17: 150 mg via ORAL

## 2018-08-17 MED ORDER — ACETAMINOPHEN 160 MG PO CHEW WRAPPED *I*
960.0000 mg | CHEWABLE_TABLET | Freq: Once | ORAL | Status: AC
Start: 2018-08-17 — End: 2018-08-17

## 2018-08-17 MED ORDER — KETOROLAC TROMETHAMINE 15 MG/ML IJ SOLN *I*
INTRAMUSCULAR | Status: DC | PRN
Start: 2018-08-17 — End: 2018-08-17
  Administered 2018-08-17: 30 mg via INTRAVENOUS

## 2018-08-17 MED ORDER — MIDAZOLAM HCL 1 MG/ML IJ SOLN *I* WRAPPED
INTRAMUSCULAR | Status: DC | PRN
Start: 2018-08-17 — End: 2018-08-17
  Administered 2018-08-17: 2 mg via INTRAVENOUS

## 2018-08-17 MED ORDER — FENTANYL CITRATE 50 MCG/ML IJ SOLN *WRAPPED*
INTRAMUSCULAR | Status: DC | PRN
Start: 2018-08-17 — End: 2018-08-17
  Administered 2018-08-17: 100 ug via INTRAVENOUS

## 2018-08-17 SURGICAL SUPPLY — 20 items
BANDAGE ELAST SLF-CLSR 6X11 LF NONSTER (Dressing) ×3 IMPLANT
BANDAGE SLF ADHER 4IN STER LF (Dressing) ×2 IMPLANT
BLADE BONE CUTTER 3.8MM (Other) IMPLANT
BLADE TORPEDO 4MM X 13CM (Other) ×3 IMPLANT
BLADE TORPEDO CURVED 4.0MM (Other) IMPLANT
BOOT KNEE HIGH NON SKID (Supply) ×3 IMPLANT
CUTTER SUTR KNOT PUSHER SLOTTED CAN SET (Other) IMPLANT
DRAPE SHEET 70X100 (Drape) ×2
DRAPE SUR W70XL100IN STD SMS POLYPR FULL SHT W/O FLD PCH DISP (Drape) ×1 IMPLANT
DRESSING CURITY NONADHERE 3X3 (Dressing) ×3 IMPLANT
GLOVE BIOGEL PI MICRO IND UNDER SZ 8.0 LF (Glove) ×9 IMPLANT
GLOVE BIOGEL PI MICRO SZ 8 (Glove) ×9 IMPLANT
GOWN SIRIUS RAGLAN NONREINFORCED XL (Gown) ×3 IMPLANT
MAT SUCT FLOOR PUDDLE GUPPY (Supply) ×3 IMPLANT
PACK CUSTOM ARTHROSCOPY KNEE CDS (Pack) ×3 IMPLANT
SET TBNG GRAVITY FOUR-SPIKE (Supply) ×3 IMPLANT
SOL LACT RINGER IRRIG 3000ML BAG (Drug) ×8 IMPLANT
SUTR ETHILON MONO 3-0 PS-2 BLACK (Suture) ×3 IMPLANT
TUBING MEDI-VAC NONCONDUC 12FT X 3/16IN (Tubing) ×3 IMPLANT
WAND APOLLO RF 50 DEG (Other) ×3 IMPLANT

## 2018-08-17 NOTE — Anesthesia Case Conclusion (Signed)
CASE CONCLUSION  Emergence  Actions:  LMA removed  Criteria Used for Airway Removal:  Adequate Tv & RR and acceptable O2 saturation  Assessment:  Routine  Transport  Directly to: PACU  Airway:  Nasal cannula  Oxygen Delivery:  2 lpm  Position:  Recumbent  Patient Condition on Handoff  Level of Consciousness:  Mildly sedated  Patient Condition:  Stable  Handoff Report to:  RN

## 2018-08-17 NOTE — Progress Notes (Signed)
[facility]  Peri-Operative Care Note      Patient is seen immediate post-op right knee surgery - Arthroscopy performed 08/17/2018.    Pain Assessment  Pain control measures reviewed, including appropriate use of ice and medications as prescribed. Patient is to call MD in the event of uncontrolled pain, nausea/vomiting or fever or with any additional concerns.    Joint Protection and Education  Proper don/doff of brace instructed pre-operatively, reviewed/confirmed post-operatively.   Joint protection concepts for ADLs, dressing, sleeping and self-care instructed pre-operatively; reviewed/confirmed post-operatively.     Immediate Post-Operative Exercises  Patient was instructed to perform exercises as follows per post-operative protocol:  · Foot/ankle- Ankle pumps, begin today   · Knee- Quad sets, begin today   · Knee- PROM - flex, ext, begin tomorrow, as tolerated    Activity Counseling  Touch down Weight-Bearing - right lower extremity for 48 hours, then progress as tolerated.  Patient was educated on crutch ambulation including stairs.  Patient was advised to avoid stairs for the first 24-48 hours.  Patient was instructed not to drive.    Goals:  1. Pt independent with joint protection concepts for ADLs  2. Pt independent with joint ROM/muscular exercises as instructed and appropriate    Patient/family provided contact information for additional questions/concerns/follow-up care.  Patient was scheduled for therapy with [facility].  All questions were invited and answered to their satisfaction.    Renuka Farfan A Jaxyn Rout, ATC

## 2018-08-17 NOTE — Discharge Instructions (Signed)
Knee Arthroscopy   Ina Kick, MD         [phone]                              [facility]   [place] [address]    DISCHARGE/POST-OPERATIVE INSTRUCTIONS: KNEE ARTHROSCOPY    ON THE DAY OF YOUR SURGERY   You must be accompanied by a responsible adult upon discharge and for 24 hours after surgery.   Be aware of dizziness, which may cause you to fall. Change positions slowly.   Do not make any important decisions for 24 hours due to the potential lingering effects of anesthesia.    ACTIVITY   You may bear weight as tolerated, but use crutches for the first 48 hours for additional support and stability while walking. Physical therapy will help you wean off crutches and return to your desired activities.   Wiggle the toes of your surgical extremity, squeeze/contract your quadriceps muscle, and pump your foot/ankle up and down at least 10 times/hour and whenever you think about it. This will promote circulation within your operative extremity.    DIET   Begin with clear liquids and advance to your regular diet as tolerated.    DRESSINGS   Keep your dressings clean and dry. They may be removed 2-3 days after your surgery. At this time, apply Band-Aids to your incisions until your stitches are removed, 7-10 days after surgery by your surgeon or physical therapist.   You can get your surgical incisions wet in the shower after dressings are removed. **Do not submerge your operative knee in a bath, pool, or Jacuzzi for at least 2 weeks after surgery.   After removing your dressings, rewrap your ACE wrap around the knee.    ICE   Use a well-sealed bag of ice over your knee intermittently. A good general rule is to ice your operative area for 20 minutes at a time on and off.   Ice your knee after therapy and as long as pain and swelling persist.    NAUSEA/VOMITING   Nausea/vomiting may occur after surgery due to lingering effects of your anesthetic or pain  medications. These effects should be short lived. If you feel nauseous on returning home, decrease activities and return to a liquid diet. If the problem persists, call your surgeon.    MEDICATION     You should not drive, operate machinery/power tools, or drink alcoholic beverages while you are taking prescription narcotic pain medication.   Do not take additional Tylenol (Acetaminophen) with narcotic pain medication.   Ibuprofen (Alleve, Motrin) may be taken for added pain control.   Take pain medication with food as it may cause nausea/upset stomach.   Pain medication may cause constipation in which case over the counter stool   softeners are usually effective.    DRIVING   For surgery involving the right knee or if you drive a standard transmission, you may drive AFTER you are off crutches and all narcotic pain medications.   For surgery involving your left knee, you may drive when you are comfortable and off all narcotic pain medication.  CALL YOUR DOCTOR FOR ANY OF THE FOLLOWING   Fevers > 101   Redness, warmth, or persistent drainage from the surgical incision   Pain that does not improve with pain medication   Persistent nausea or vomiting into the next day.   Bleeding or continuous  oozing from your surgical incisions   Increased swelling in your toes or severe tightness within your operative leg (not improved by loosening your ACE wrap or elevating your limb above your heart)   Pale, blue, or cold toe nail beds (compared with your non-operative leg)   Notify your surgeon if you have not urinated within 12 hours after discharge.    FOLLOWUP CARE   Your first postoperative (7-10 days after surgery) visit will be arranged when   you call to schedule surgery. If this was not arranged preoperatively, please   call immediately following surgery to be seen within 7-10 days from the day   of your surgery.   Physical Therapy will begin 2-3 days following surgery.   If you have any questions  regarding your postoperative care, please call Dr.   Idamae Lusher at [phone]   If you need to change your appointment, please call [phone]  I have been instructed in and received a copy of the discharge instructions. I have  had the opportunity to ask questions regarding my postoperative care and my  questions have been answered satisfactorily.    About your medications from today    []  Prescription information provided from onsite pharmacy.   []  Prescription information given to patient and/or patient representative, prescription not filled at onsite pharmacy.      Your last pain medication was given to you at: Pain medication given at 4:30pm.     Your next dose of pain medication is due after: May take again at 10:30pm. Continue regular medications.

## 2018-08-17 NOTE — H&P (Signed)
UPDATES TO PATIENT'S CONDITION on the DAY OF SURGERY/PROCEDURE    I. Updates to Patient's Condition (to be completed by a provider privileged to complete a H&P, following reassessment of the patient by the provider):    Day of Surgery/Procedure Update:  History  History reviewed and no change    Physical  Physical exam updated and no change            II. Procedure Readiness   I have reviewed the patient's H&P and updated condition. By completing and signing this form, I attest that this patient is ready for surgery/procedure.    III. Attestation   I have reviewed the updated information regarding the patient's condition and it is appropriate to proceed with the planned surgery/procedure.    Arvil Persons, MD as of 3:08 PM 08/17/2018

## 2018-08-17 NOTE — Op Note (Signed)
Patient: [name]  MRN: [id number]   DOS: 08/17/2018     Surgeon: Arlys John D. Idamae Lusher MD    First Assistant: Cardell Peach MD    Preoperative Diagnosis:    Right knee symptomatic lateral meniscus tear  patellofemoral, trochlear and lateral tibial plateau chondromalacia  Synovitis    Postoperative Diagnosis:   Same    Procedure:  Right knee diagnostic and operative arthroscopy  Partial lateral meniscectomy 20%  Synovectomy infrapatellar, intraarticular notch, medial compartment, lateral compartment and patellofemoral compartment  Chondroplasty patellofemoral and lateral compartment    Anesthesia Type: General with LMA.     Estimated Blood Loss: Minimal    Fluid Intake: Per anesthesia record    Urine Output: None    Specimens: None    Tourniquet time: None    Implants: None    Patient Condition at Termination of Procedure: Stable to PACU    Complications: None    Intraoperative Findings:   Synovium diffusely hyperemic and injected with frond-like projections  Infrapatellar synovial fat pad thickened and hypertrophic with evidence of impingement on terminal extension  Medial meniscus Normal  Lateral meniscus Root tear posterior  ACL intact but attenuated; PCL intact and taut  Articular cartilage Patella Grade II 50% with delamination  Trochlea Grade II 30% with delamination  Medial Compartment Grade II 20% without delamination  Lateral Compartment Grade IV 20% with delamination  Patella tracking normal    Procedure:    The patient was identified in the pre-anesthesia holding area and the right knee was marked by the attending surgeon as the corrective operative site. Pre-operative antibiotics were administered within 30 minutes on call to the operating room. The patient was then brought to the operating room and placed supine on the operating table. All bony prominences were well padded.     The patient underwent induction of general LMA anesthesia. The operative extremity was then prepped and draped in the usual  sterile manner for the procedure. A surgical pause was undertaken, identifying the right knee as the correct surgical site. A well padded stress post was raised. Proposed arthroscopy portal sites were injected with 2-3 cc of 1% Lidocaine to further anesthetize the portal tracts. An anterolateral portal was established in the usual fashion. Diagnostic arthroscopy commenced with findings noted above. Under the guidance of an 18 gauge spinal needle, an anteromedial portal was established just superior to the medial meniscus.     A 30 degree arthroscope was brought into the patellofemoral compartment. The patellar articular cartilage was delaminated, A full radius shaver was introduced into the patellofemoral compartment via the anteromedial portal and chondroplasty of the patellar articular cartilage was carried out, stabilizing the peripheral border where chondral delamination was present; microfracture was not performed. The trochlear articular cartilage was delaminated, A full radius shaver was introduced into the patellofemoral compartment via the anteromedial portal and chondroplasty of the trochlear cartilage was carried out, stabilizing the peripheral border where chondral delamination was present microfracture was not peformed Synovium within the patellofemoral space was hyperemic. A shaver was brought into the patellofemoral space and synovectomy was completed, removing hyperemic and injected portions of synovium. Medial and lateral gutter spaces were inspected and loose bodies were not present.     Patellar tracking was assessed with the knee flexed and extended. The patella was noted to track centrally.     Next, the knee was flexed and the arthroscope was brought into the medial compartment. Synovectomy of friable and hypertrophic synovium was completed. The operative extremity was  brought into a valgus position using a well padded valgus stress post. The medial meniscus was carefully inspected and probed.  The meniscus was normal, intact, and in continuity throughout its course. The medial compartment articular cartilage demonstrated delamination. A full radius shaver was introduced into the medial compartment via the anteromedial portal and chondroplasty of delaminated articular cartilage was carried out, stabilizing the peripheral border where chondral delamination was present microfracture was not performed.    The arthroscope was then brought into the intraarticular notch space. The infrapatella fat pad and synovium were hypertrophic and demonstrated impingement in terminal extension. The infrapatellar fat pad and synovium were debrided. The ACL and PCL was intact and in continuity throughout the course.     Next, the knee was brought to a figure-of-4 position, allowing exposure of the lateral compartment and lateral meniscus. Synovectomy of friable and hypertrophic synovium was completed. The popliteus was visualized and was found to be intact. The lateral meniscus was carefully inspected and probed. The meniscus demonstrated tearing at the posterior horn. The tear type was root tear. The tear was deemed irreparable based on appearance and stability. Using a combination of shaving and biting instruments, the unstable margin of the meniscus was trimmed back to a stable contour. Further probing demonstrated that the periphery of the meniscus was now stable following trimming. The lateral compartment was then evacuated of all residual meniscal debris.The lateral compartment articular cartilage demonstrated delamination. A full radius shaver was introduced into the medial compartment via the anteromedial portal and chondroplasty of delaminated articular cartilage was carried out, stabilizing the peripheral border where chondral delamination was present microfracture was not performed.    After completion of all necessary meniscal, chondral, and synovial work, meticulous hemostasis was obtained and the knee was  finally evacuated of all residual soft tissue debris. Incisions were copiously irrigated and closed with 3-0 Nylon sutures. Sterile dressings were applied and overwrapped with an Ace wrap. The patient was awoken from anesthesia in stable condition. All sharps and counts were correct at the conclusion of the case. No intraoperative complications were noted. I was present and scrubbed for all portions of the procedure.

## 2018-08-17 NOTE — H&P (Signed)
UPDATES TO PATIENT'S CONDITION on the DAY OF SURGERY/PROCEDURE    I. Updates to Patient's Condition (to be completed by a provider privileged to complete a H&P, following reassessment of the patient by the provider):    Day of Surgery/Procedure Update:  History  History reviewed and no change    Physical  Physical exam updated and no change            II. Procedure Readiness   I have reviewed the patient's H&P and updated condition. By completing and signing this form, I attest that this patient is ready for surgery/procedure.    III. Attestation   I have reviewed the updated information regarding the patient's condition and it is appropriate to proceed with the planned surgery/procedure.    Arvil Persons, MD as of 2:56 PM 08/17/2018

## 2018-08-17 NOTE — Anesthesia Postprocedure Evaluation (Signed)
Anesthesia Post-Op Note    Patient: [name]    Procedure(s) Performed:  Procedure Summary  Date:  08/17/2018 Anesthesia Start: 08/17/2018  3:24 PM Anesthesia Stop: 08/17/2018  4:21 PM Room / Location:  [id number]   Procedure(s):  RIGHT KNEE ARTHROSCOPY WITH CHONDROPLASTY  KNEE ARTHROSCOPY WITH SYNOVECTOMY  KNEE ARTHROSCOPY DIAGNOSTIC  KNEE ARTHROSCOPY WITH PARTIAL MEDIAL MENISCECTOMY Diagnosis:  Arthritis of knee [M17.10] Surgeon(s):  Arvil Persons, MD  Haws, Fulton Reek, MD Attending Anesthesiologist:  Maximino Sarin, MD         Recovery Vitals  BP: (!) 145/101 (08/17/2018  5:00 PM)  Heart Rate: 71 (08/17/2018  2:16 PM)  Heart Rate (via Pulse Ox): 83 (08/17/2018  5:00 PM)  Resp: 16 (08/17/2018  5:00 PM)  Temp: 36.7 C (98.1 F) (08/17/2018  4:20 PM)  SpO2: 100 % (08/17/2018  5:00 PM)  O2 Flow Rate: 2 L/min (08/17/2018  4:20 PM)   0-10 Scale: 5 (reports tolerable) (08/17/2018  5:00 PM)    Anesthesia type:  Anesthesia type not filed in the log.  Complications Noted During Procedure or in PACU:  None   Comment:    Patient Location:  PACU  Level of Consciousness:    Recovered to baseline  Patient Participation:     Able to participate  Temperature Status:    Normothermic  Oxygen Saturation:    Within patient's normal range  Cardiac Status:   Within patient's normal range  Fluid Status:    Stable  Airway Patency:     Yes  Pulmonary Status:    Baseline  Pain Management:    Adequate analgesia  Nausea and Vomiting:  None    Post Op Assessment:    Tolerated procedure well and no evidence of recall"   Attending Attestation:  All indicated post anesthesia care provided       -

## 2018-08-17 NOTE — INTERIM OP NOTE (Signed)
Interim Op Note (Surgical Log ID: [id number])       Date of Surgery: 08/17/2018       Surgeons: Moishe Spice) and Role:     * Arvil Persons, MD - Primary     * Chinmayi Rumer, Fulton Reek, MD - Resident - Assisting       Pre-op Diagnosis: Pre-Op Diagnosis Codes:     * Arthritis of knee [M17.10]       Post-op Diagnosis: Post-Op Diagnosis Codes:     * Arthritis of knee [M17.10]       Procedure(s) Performed: Procedure(s) (LRB):  RIGHT KNEE ARTHROSCOPY WITH CHONDROPLASTY (Right)  KNEE ARTHROSCOPY WITH SYNOVECTOMY (Right)  KNEE ARTHROSCOPY DIAGNOSTIC (Right)  KNEE ARTHROSCOPY WITH PARTIAL MEDIAL MENISCECTOMY (Right)       Anesthesia Type: Anesthesia type not filed in the log.        Fluid Totals: I/O this shift:  10/28 1500 - 10/28 2259  In: 700 (10.3 mL/kg) [I.V.:700]  Out: 5 (0.1 mL/kg) [Blood:5]  Net: 695  Weight: 68 kg        Estimated Blood Loss: 5 mL       Specimens to Pathology:  * No specimens in log *       Temporary Implants:        Packing:                 Patient Condition: good       Findings (Including unexpected complications): See op note     Signed:  Gerald Leitz, MD  on 08/17/2018 at 4:20 PM

## 2018-08-17 NOTE — Anesthesia Procedure Notes (Signed)
---------------------------------------------------------------------------------------------------------------------------------------    AIRWAY   GENERAL INFORMATION AND STAFF    Patient location during procedure: OR  CONDITION PRIOR TO MANIPULATION     Current Airway/Neck Condition:  Normal        For more airway physical exam details, see Anesthesia PreOp Evaluation  AIRWAY METHOD     Preoxygenated: yes      Induction: IV  Mask Difficulty Assessment:  0 - not attempted    Number of Attempts at Approach:  1  FINAL AIRWAY DETAILS    Final Airway Type:  LMA    Adjunct Airway: soft bite block    Final LMA: Unique    LMA Size: 4  ----------------------------------------------------------------------------------------------------------------------------------------

## 2018-08-18 ENCOUNTER — Encounter: Payer: Self-pay | Admitting: Orthopedic Surgery

## 2018-08-19 ENCOUNTER — Telehealth: Payer: Self-pay

## 2018-08-19 NOTE — Telephone Encounter (Signed)
Called and spoke with Dr. Maylon Peppers office. We would recommended doubling her pain medication for 2-3 days then back down to base line pain medication post knee arthroscopic surgery. No further medication should be needed beyond 2-3 days post-op.

## 2018-08-19 NOTE — Telephone Encounter (Signed)
10/30 [name] had rt knee scope on 10/28.  Apparently her PCP is prescribing her post op pain meds.  She says they will not do this without an OK from our office    Dr Megan Salon is PCP  [phone]

## 2018-08-21 ENCOUNTER — Ambulatory Visit: Payer: Medicaid (Managed Care) | Attending: Orthopedic Surgery | Admitting: Physical Therapy

## 2018-08-21 DIAGNOSIS — S8991XD Unspecified injury of right lower leg, subsequent encounter: Secondary | ICD-10-CM | POA: Insufficient documentation

## 2018-08-21 NOTE — Progress Notes (Signed)
[facility]   POST-OPERATIVE KNEE EVALUATION    Diagnosis: Right knee arthroscopy, partial lateral meniscectomy 20%, synovectomy infrapatellar, intraarticular notch, medial compartment, lateral compartment and patellofemoral compartment, chondroplasty patellofemoral and lateral compartment.  Date of Surgery: 08/17/18    History  [name] is a 42 y.o. female who is present today 4 days s/p Right knee arthroscopy, partial lateral meniscectomy 20%, synovectomy infrapatellar, intraarticular notch, medial compartment, lateral compartment and patellofemoral compartment, chondroplasty patellofemoral and lateral compartment.    [name] arrives to PT reporting third surgery on her right knee. 1st surgery was ACL-R 04/2015 out of network. 08/2016 arthroscopy with Dr. Idamae Lusher. Her symptoms of right knee burning pain, instability started in June. Severe scoliosis and sciatica may impact prognosis.    Pt reports pain is mild.  Symptom location: Generalized, right knee  Relevant symptoms:  Burning, Pain , Decreased ROM, Decreased strength, Swelling, Instability, Buckling/Giving way  Symptom frequency: Constant  Symptom intensity:  (0 - 10 scale): Now 4 Best 4 Worst 10   Night Pain: Yes   Restful sleep:   No  Morning Pain/Stiffness: Increased   Symptoms worsen with: AROM right knee, weight bearing  Symptoms improve with: Rest, Ice, Medication, Elevation  Assistive device:  crutches  Patients goals for therapy: Reduce pain, Achieve independence with home program for self care / condition management  Post-op note reviewed: Yes    Occupation and Activities  Work status: Volunteers, at house of [name] demands of home: 3 flights of stair at home, lives alone  Sport(s)/Activities: Walking     Objective  Observation: right thoracolumbar scoliosis  Gait:  Axillary Crutches, Antalgic    Lumbar Screen:  Not Tested  Neurologic:  Sensation:  LE  hyposensative anterior tibia ridge     Palpation: warmth and at joint  Incision:  No evidence of infection      ROM/Strength  Full strength/ROM assessment deferred secondary to acute post-op status    KNEE RIGHT LEFT STRENGTH    AROM PROM AROM PROM RIGHT LEFT   Flexion 85  140  NA 5   Extension Lacking 2  3HE  NA 5            Quad Isometric (Quad set)     Fair Good   SLR     Lag with SLR Good         Special Tests:   Homans: negative  Remainder of LE special tests deferred secondary to acute post-op status      Functional:  Stand more than 10 minutes, - unable to perform.  Walk more than 10 minutes - unable to perform.  Ascend stairs with reciprocal gait - unable to perform.  Descend stairs with reciprocal gait - unable to perform.    Assessment:    Findings consistent with 42 y.o. female 4 days s/p Right knee arthroscopy, partial lateral meniscectomy 20%, synovectomy infrapatellar, intraarticular notch, medial compartment, lateral compartment and patellofemoral compartment, chondroplasty patellofemoral and lateral compartment with pain, swelling, ROM limitations, strength limitations, functional limitations    Personal factors affecting treatment/recovery:   Transportation problems  Comorbidities affecting treatment/recovery:  DJD (degenerative joint disease), lumbar    Chronic low back pain    Sciatica of right side    Fibromyalgia    Scoliosis of lumbar spine    Migraines    Depression, unspecified depression type    Nicotine dependence    Anxiety      Clinical presentation:   stable  Patient complexity:  moderate level as indicated by above stability of condition, personal factors, environmental factors and comorbidities in addition to patient symptom presentation and impairments found on physical exam.    Prognosis:  Good   Contraindications/Precautions/Limitation:  Per protocol  Short Term Goals: (6 week(s)): Decrease c/o max pain to < 4/10, Increase ROM to painfree AROM symmetrical to LLE and Minimal assistance with HEP/ education  concepts  Long Term Goals: (3 month(s)): Pain/Sx 0 - minimal, ROM/ flexibility WNL , Restoration of functional strength, Independent with HEP/education , Functional return to ADLs / activities without limitations     Treatment Plan:   Options / plan reviewed with patient/family:  Yes  Freq 1-2 times per week for 3 month(s)    Treatment plan inclusive of:  Exercise: AROM, AAROM, PROM, Stretching, Strengthening, Progressive Resistive, Coordination, Aerobic exercise  Manual Techniques:  Joint mobilization, Soft tissue release/massage, Scar management, Edema management  Modalities:  Biofeedback, Cold pack, Desensitization, Functional/Therapeutic activites per flowsheet, Manual therapy,  Joint Mobilization, Soft tissue Mobilization / Massage, Moist heat, Ther Exercise per flowsheet, Vasopneumatic Treatment  Functional: Proprioception/Dynamic stability, Functional rehab, Postural training, Gait training, Balance , Endurance training, Self-care, Home management    Thank you for referring this patient to [facility].    Sharin Grave, PT     Minutes   Time Based CPT  Physical Performance test, Therapeutic Exercise, Therapeutic Activities, NM Re-Education, Manual Therapy, Gait Training, Massage, Aquatic Therapy, Canalith Repositioning, Iontophoresis, Ultrasound, Orthotic fitting/training, Prosthetic fitting 25   Service Based (untimed) CPT  PT/OT evaluation, PT/OT re-eval, E-stim-unattended, Mechanical traction, Vasopneumatic device 30   Unbilled time (rest, etc)        Total Treatment Time 55     DOS: 08/17/18   08/21/18   - V1   Heel prop 5'   Calf stretch 10/10", 5'   Quad set 6", 20x   Biofeedback -->   SLR --->   EOB flex 10", 10x       Ice/elevate/ankle pumps 10'   Status:  POC: 11/21/18    Access Code: [email or web address]   URL: [email or web address]   Date: 08/21/2018   Prepared by: Weyman Croon     Exercises   Heel Prop - 5-15 minutes - 3x daily - 7x weekly   Long Sitting Calf Stretch  with Strap - 10 hold - 5 minutes - 3x daily   Long Sitting Quad Set - 10 reps - 1 sets - 6 hold - 5x daily   Sitting Heel Slide with Towel - 10 reps - 10 hold - 2x daily - 7x weekly   Ice - 15 minutes - 3x daily - 7x weekly

## 2018-08-24 ENCOUNTER — Ambulatory Visit: Payer: Medicaid (Managed Care) | Attending: Orthopedic Surgery | Admitting: Orthopedic Surgery

## 2018-08-24 ENCOUNTER — Encounter: Payer: Self-pay | Admitting: Orthopedic Surgery

## 2018-08-24 VITALS — BP 136/73 | HR 82 | Ht 68.0 in | Wt 155.0 lb

## 2018-08-24 DIAGNOSIS — Z9889 Other specified postprocedural states: Secondary | ICD-10-CM

## 2018-08-24 DIAGNOSIS — M25561 Pain in right knee: Secondary | ICD-10-CM

## 2018-08-24 NOTE — Procedures (Signed)
Suture removal  Date/Time: 08/24/2018 10:19 AM  Performed by: Westley Gambles, RN  Authorized by: Chaya Jan, PA     Location:     Location:  Lower extremity    Lower extremity location:  Knee    Knee location:  L knee  Procedure details:     Objective wound description: lean, dry and intact.    Sutures removed?:  yes  Post-procedure details:     Post-procedure assessment: Benzoin and steri-strips applied followed with an ace wrap.    Patient tolerance of procedure:  Tolerated well, no immediate complications

## 2018-08-24 NOTE — Progress Notes (Signed)
PATIENTKAMEE, [name]  MR #:  [id number]   CSN:  [id number] DOB:  [birth year]   DICTATED BY:  Chaya Jan, PA DATE OF VISIT:  08/24/2018     CHIEF COMPLAINT:  One week post right knee arthroscopic surgery, lateral meniscectomy, and chondroplasty.    INTERVAL HISTORY:  [name] states that she is doing well.  She has noticed a little bit of swelling in the knee mostly in the back of her knee.  Denies any calf pain.  No fevers, no chills.  Denies any drainage from incisions.  She has started physical therapy, had 1 visit last week.  She has another visit coming up this week.  She is using a cane at this time.    PHYSICAL EXAMINATION:  [name] is a 42 year old woman, alert and oriented x3.  Pleasant affect.  Appears in no acute distress.  SKIN: Warm and dry to touch.  Incisions are intact.  No signs of infection.  No drainage. EXTREMITIES:  She can extend the knee to within just approximately 5 degrees of terminal extension.  Flexion is to approximately 100 degrees.  She has a palpable Baker cyst.  Calves are soft and nontender bilaterally.  Distal vascular exam intact.    ASSESSMENT AND PLAN:  [name] is a 42 year old woman now 1 week post right knee arthroscopic surgery with lateral meniscectomy and chondroplasty.  At this time, sutures were removed.  Steri-Strips applied.  Ace wrap was applied as well.  She will follow up with [place] in another 4 to 6 weeks.  We did discuss continued ice and elevation.  She cannot take anti-inflammatories due to a history of a gastric ulcer.  She will continue with her Percocet for pain as needed; however, she will continue to obtain this from her primary care.  I would ideally like to have her off this medication at this point, she should continue to wean from this.  If she should have any further questions or concerns, she should feel free to contact the office.  Follow up with [place] again in 4 weeks.       Dictated By:  Chaya Jan,  PA      ______________________________  Arvil Persons, MD    DGK/MODL  DD:  08/24/2018 09:57:40  DT:  08/24/2018 10:08:17  Job #:  [id number]    cc:

## 2018-08-27 ENCOUNTER — Ambulatory Visit: Payer: Medicaid (Managed Care) | Attending: Orthopedic Surgery | Admitting: Physical Therapy

## 2018-08-27 DIAGNOSIS — S8991XD Unspecified injury of right lower leg, subsequent encounter: Secondary | ICD-10-CM | POA: Insufficient documentation

## 2018-08-27 NOTE — Progress Notes (Signed)
[facility]  PT Treatment Note    Todays Date: 08/27/2018    Name: [name]  DOB: 1976/10/19  Referring Physician: Chaya Jan, PA  Diagnosis: Right knee arthroscopy, partial lateral meniscectomy 20%, synovectomy infrapatellar, intraarticular notch, medial compartment, lateral compartment and patellofemoral compartment, chondroplasty patellofemoral and lateral compartment.  Date of Surgery: 08/17/18    Per previous encounters: [name] arrives to PT reporting third surgery on her right knee. 1st surgery was ACL-R 04/2015 out of network. 08/2016 arthroscopy with Dr. Idamae Lusher. Her symptoms of right knee burning pain, instability started in June. Severe scoliosis and sciatica may impact prognosis. 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        Objective:  ROM -  Right, Knee improved flexion and extension  KNEE RIGHT LEFT STRENGTH    AROM PROM AROM PROM RIGHT LEFT   Flexion 120  140  NA 5   Extension 3HE  3HE  NA 5            Quad Isometric (Quad set)     Good Good   SLR     Fair Good     Strength - Therapeutic Exercises per flowsheet, Functional activities as charted, Home program   Function: - Improved   Observation: right thoracolumbar scoliosis  Education:  Updated HEP, Verbal cues for ther ex, Gait training, Joint ed concepts, Independent symptom management techniques, Activity modification, Biofeedback  Treatment:  Biofeedback, Cold pack, Functional/Therapeutic activites per flowsheet, Ther Exercise per flowsheet    Assessment:   Kathleene presents 10 days s/p right knee arthroscopy. Her ROM has improved significantly from evaluation and she is ambulating with cane versus crutches. She will benefit from exercise  progression to restore gait, range and functional strength.        Plan of Care:  Continue per Plan of care -  As written; Patient would benefit from skilled rehabilitation services to address the above impairments to restore functional capacity.    Thank you for referring this patient to [facility] - Sports and Spine Rehabilitation    Sharin Grave, [place]     Minutes   Time Based CPT  Physical Performance test, Therapeutic Exercise, Therapeutic Activities, NM Re-Education, Manual Therapy, Gait Training, Massage, Aquatic Therapy, Canalith Repositioning, Iontophoresis, Ultrasound, Orthotic fitting/training, Prosthetic fitting 45   Service Based (untimed) CPT  PT/OT evaluation, PT/OT re-eval, E-stim-unattended, Mechanical traction, Vasopneumatic device    Unbilled time (rest, etc)        Total Treatment Time 45     DOS: 08/17/18   08/27/18   - V2   Recumbent bike 10'               Heel prop 5'   Calf stretch 10/10", 5'   Quad set 6", 20x with biofeedback   SLR 20x modified range with biofeedback   EOB flex 10", 10x       Heel raises 30x   SLS -->   Standing hip -->       Pt edu Gait training with cane           Ice/elevate/ankle pumps 10'   Status:  POC: 11/21/18    Access Code: [email or web address]   URL: [email or web address]  Date: 08/21/2018   Prepared by: Weyman CroonAshley Charmika Macdonnell     Exercises   Heel Prop - 5-15 minutes - 3x daily - 7x weekly   Long Sitting Calf Stretch with Strap - 10 hold - 5 minutes - 3x daily   Long Sitting Quad Set - 10 reps - 1 sets - 6 hold - 5x daily   Sitting Heel Slide with Towel - 10 reps - 10 hold - 2x daily - 7x weekly   Ice - 15 minutes - 3x daily - 7x weekly

## 2018-09-03 ENCOUNTER — Ambulatory Visit: Payer: Medicaid (Managed Care) | Admitting: Physical Therapy

## 2018-09-03 DIAGNOSIS — S8991XD Unspecified injury of right lower leg, subsequent encounter: Secondary | ICD-10-CM

## 2018-09-03 NOTE — Progress Notes (Signed)
[facility]  PT Treatment Note    Todays Date: 09/03/2018    Name: [name]  DOB: 1976/03/20  Referring Physician: Chaya Jan, PA  Diagnosis: Right knee arthroscopy, partial lateral meniscectomy 20%, synovectomy infrapatellar, intraarticular notch, medial compartment, lateral compartment and patellofemoral compartment, chondroplasty patellofemoral and lateral compartment.  Date of Surgery: 08/17/18    Per previous encounters: [name] arrives to PT reporting third surgery on her right knee. 1st surgery was ACL-R 04/2015 out of network. 08/2016 arthroscopy with Dr. Idamae Lusher. Her symptoms of right knee burning pain, instability started in June. Severe scoliosis and sciatica may impact prognosis. 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        Objective:  ROM -  Right, Knee improved flexion and extension  KNEE RIGHT LEFT STRENGTH    AROM PROM AROM PROM RIGHT LEFT   Flexion 120  140  NA 5   Extension 3HE  3HE  NA 5            Quad Isometric (Quad set)     Good Good   SLR     Fair Good     Strength - Therapeutic Exercises per flowsheet, Functional activities as charted, Home program   Function: - Improved   Observation: right thoracolumbar scoliosis  Education:  Updated HEP, Verbal cues for ther ex, Gait training, Joint ed concepts, Independent symptom management techniques, Activity modification, Biofeedback  Treatment:  Biofeedback, Cold pack, Functional/Therapeutic activites per flowsheet, Ther Exercise per flowsheet    Assessment:   [name] presents 2 weeks s/p right knee arthroscopy. Her ROM has improved significantly and she tolerated all new therapeutic exercise progressions today without complaints.She will benefit  from exercise progression to restore gait, range and functional strength.        Plan of Care:  Continue per Plan of care -  As written; Patient would benefit from skilled rehabilitation services to address the above impairments to restore functional capacity.    Thank you for referring this patient to [facility]opa[facility] - Sports and Spine Rehabilitation    Sharin Grave, [place]     Minutes   Time Based CPT  Physical Performance test, Therapeutic Exercise, Therapeutic Activities, NM Re-Education, Manual Therapy, Gait Training, Massage, Aquatic Therapy, Canalith Repositioning, Iontophoresis, Ultrasound, Orthotic fitting/training, Prosthetic fitting 45   Service Based (untimed) CPT  PT/OT evaluation, PT/OT re-eval, E-stim-unattended, Mechanical traction, Vasopneumatic device    Unbilled time (rest, etc)        Total Treatment Time 45     DOS: 08/17/18   09/03/18   - V3   Recumbent bike 10'               Heel prop 5'   Calf stretch 10/10", 5'   Quad set 6", 20x with biofeedback   SLR 20x modified range with biofeedback   EOB flex 10", 10x       Heel raises 30x   SLS -->   Standing hip -->       Pt edu Gait training with cane           Ice/elevate/ankle pumps 10'   Status:  POC: 11/21/18    Access Code: [email or web address]   URL: [email or web address]  Date: 08/21/2018   Prepared by: Weyman CroonAshley Tattianna Schnarr     Exercises   Heel Prop - 5-15 minutes - 3x daily - 7x weekly   Long Sitting Calf Stretch with Strap - 10 hold - 5 minutes - 3x daily   Long Sitting Quad Set - 10 reps - 1 sets - 6 hold - 5x daily   Sitting Heel Slide with Towel - 10 reps - 10 hold - 2x daily - 7x weekly   Ice - 15 minutes - 3x daily - 7x weekly

## 2018-09-10 ENCOUNTER — Ambulatory Visit: Payer: Medicaid (Managed Care) | Admitting: Physical Therapy

## 2018-09-10 DIAGNOSIS — S8991XD Unspecified injury of right lower leg, subsequent encounter: Secondary | ICD-10-CM

## 2018-09-10 NOTE — Progress Notes (Signed)
[facility]  PT Treatment Note    Todays Date: 09/10/2018    Name: [name]  DOB: 01/29/1976  Referring Physician: Chaya JanKleehammer, Daniel G, PA  Diagnosis: Right knee arthroscopy, partial lateral meniscectomy 20%, synovectomy infrapatellar, intraarticular notch, medial compartment, lateral compartment and patellofemoral compartment, chondroplasty patellofemoral and lateral compartment.  Date of Surgery: 08/17/18    Per previous encounters: [name] arrives to PT reporting third surgery on her right knee. 1st surgery was ACL-R 04/2015 out of network. 08/2016 arthroscopy with Dr. Idamae LusherGiordano. Her symptoms of right knee burning pain, instability started in June. Severe scoliosis and sciatica may impact prognosis. 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 She reports that she will be having an evaluation on her back next week. She thinks she is improving with knee function       Objective:  ROM -  Right, Knee WFL, 3-0-140  Strength - Therapeutic Exercises per flowsheet, Functional activities as charted, Home program   Function: - Improved   Observation: right thoracolumbar scoliosis  Education:  Updated HEP, Verbal cues for ther ex, Gait training, Joint ed concepts, Independent symptom management techniques, Activity modification, Biofeedback  Treatment:  Biofeedback, Cold pack, Functional/Therapeutic activites per flowsheet, Ther Exercise per flowsheet    Assessment:   [name] presents 3 weeks s/p right knee arthroscopy. Her ROM has improved significantly and she tolerated all new therapeutic exercise progressions today without complaints. Her c/c at this time is low back pan and she attributes this to chronic discomfort in addition to  antalgic gait recently s/p knee arthroscopy. She will benefit from exercise progression to restore gait, range and functional strength.        Plan of Care:  Continue per Plan of care -  As written; Patient would benefit from skilled rehabilitation services to address the above impairments to restore functional capacity.    [facility]    Sharin GraveAshley Lynn Skyley [place], [place]     Minutes   Time Based CPT  Physical Performance test, Therapeutic Exercise, Therapeutic Activities, NM Re-Education, Manual Therapy, Gait Training, Massage, Aquatic Therapy, Canalith Repositioning, Iontophoresis, Ultrasound, Orthotic fitting/training, Prosthetic fitting 45   Service Based (untimed) CPT  PT/OT evaluation, PT/OT re-eval, E-stim-unattended, Mechanical traction, Vasopneumatic device    Unbilled time (rest, etc)        Total Treatment Time 45     DOS: 08/17/18   09/03/18   - V3   Recumbent bike 10'   Step ups BUE support, [employer], 3x10   Step downs --->FP       Heel prop/calf stretch 10/10", 5'   SLR/ hip abd 30x   Heel raises 30x   Step ups  6", 2x10   SLS -->   Standing hip -->       Pt edu Gait training with cane           Ice/elevate/ankle pumps 10'   Status:  POC: 11/21/18    Access Code: [email or web address]   URL: [email or web address]   Date: 08/21/2018   Prepared by: Weyman CroonAshley Suleyman Ehrman

## 2018-09-14 ENCOUNTER — Ambulatory Visit
Admission: RE | Admit: 2018-09-14 | Discharge: 2018-09-14 | Disposition: A | Discharge status: Home / Self Care | Payer: Medicaid (Managed Care) | Source: Ambulatory Visit | Attending: Physical Medicine and Rehabilitation | Admitting: Physical Medicine and Rehabilitation

## 2018-09-14 ENCOUNTER — Encounter: Payer: Self-pay | Admitting: Physical Medicine and Rehabilitation

## 2018-09-14 ENCOUNTER — Ambulatory Visit: Payer: Medicaid (Managed Care) | Admitting: Physical Medicine and Rehabilitation

## 2018-09-14 VITALS — BP 154/87 | HR 86 | Ht 68.0 in | Wt 153.0 lb

## 2018-09-14 DIAGNOSIS — M5116 Intervertebral disc disorders with radiculopathy, lumbar region: Secondary | ICD-10-CM | POA: Insufficient documentation

## 2018-09-14 DIAGNOSIS — M4807 Spinal stenosis, lumbosacral region: Secondary | ICD-10-CM

## 2018-09-14 DIAGNOSIS — M4727 Other spondylosis with radiculopathy, lumbosacral region: Secondary | ICD-10-CM | POA: Insufficient documentation

## 2018-09-14 DIAGNOSIS — M419 Scoliosis, unspecified: Secondary | ICD-10-CM | POA: Insufficient documentation

## 2018-09-14 DIAGNOSIS — M5416 Radiculopathy, lumbar region: Secondary | ICD-10-CM

## 2018-09-21 NOTE — Progress Notes (Signed)
Dear Dr. Leola Brazil, MD:    Thank you for allowing me to evaluate your patient, [name], as a new patient consultation.    Chief complaint: Low back pain radiating to the right lower extremity    History of present illness: Patient is a 42 y.o. female who presents with a chief complaint of primarily axial low back pain radiating to the right posterolateral lower extremity. Patient states symptoms began spontaneously over a year ago.  Patient reports the quality of symptoms vary from aching, throbbing, and burning and reports the intensity of pain up to 9/10 but is worsening.  Patient reports exacerbating factors include prolonged standing and walking, lumbar extension, twisting lifting.  Patient reports mitigating factors include changing positions.    Social/functional history: [name] reports limitations with activity level, hobbies, and mobility due to pain.    Review of systems: Banita Lehn denies any bowel or bladder dysfunction constitutional symptoms or progressive lower extremity weakness.  A comprehensive 10 system multisystem review of systems was reviewed with the patient and proved negative for pertinent positives as they relate to the lumbar spine.    Please note that information supplemental to this dictation including medications, allergies, 10 system multisystem review of systems, personal medical/surgical history, family medical/surgical history, and social/functional history have been reviewed with the patient and recorded in the patient's electronic medical record prior to this dictation.        Physical examination/focused musculoskeletal/spine examination:    Alert awake appropriate female in no acute distress.    Vitals:    09/14/18 0733   BP: 154/87   Pulse: 86   Weight: 69.4 kg (153 lb)   Height: 1.727 m (5\' 8" )       Patient demonstrates the ability to heel walk toe walk and tandem walk without difficulty or pain.    Skin is without scars rashes or ulcers and the lumbar  region.    Girth of the mid thighs and calves are grossly symmetrical bilaterally.    Gross inspection reveals a lumbar scoliotic spinal deformity.    Range of motion of the hips and knees are nonpainful bilaterally.    Palpation of the lumbar spinous processes paraspinal muscles and bilateral trochanteric bursa were non pain provoking.    Straight leg raise was positive causing lower extremity pain on the right.  Patrick's maneuver was negative bilaterally.  Palpation over the sacral sulcus was non pain provoking bilaterally.    Strength is intact 5 out 5 lower extremities bilaterally throughout all lumbar myotomes.  Sensation is intact to a pin lower extremities bilaterally throughout all lumbar dermatomes.  Reflexes are graded 1+ and symmetrical at the patella and Achilles bilaterally.  Upper motor neuron signs are not pathologic bilaterally.    Mood and affect is within normal limits.  Waddell scores greater than 0/5.    Pulses are graded 2+ and symmetrical at the ankles bilaterally.    Lumbar flexion is achieved to the ankles without pain.  Lumbar extension is limited by 25% causing pain.    Radiological review:    #1.  X-rays of the lumbar spine performed today in the office are available for review demonstrating 5 lumbar type vertebrae.  There is a lumbar scoliosis, likely idiopathic    Impression:    #1.  Probable right lumbosacral radicular pain and axial low back pain in the setting of a strong neurogenic claudication-type presentation likely secondary to spinal stenosis in a patient with a likely idiopathic lumbar scoliosis.  Therapeutic Plan:    #1.  [name] Brinkngela Parilla was referred to physical therapy to focus on an active dynamic lumbar spine stabilization program in a flexion bias focusing on Williams exercises, core strengthening, stretching, conditioning, and optimization of the patient's aerobic index.    #2.  The patient was instructed to avoid repetitive lumbar extension.    #3.  The patient and I  discussed the utilization of acetaminophen/Tylenol 1000 mg by mouth every 8 hours when necessary for pain control.    #4.  The patient and I discussed utilization of ThermaCare wraps to provide proprioceptive support and continual heat to service a muscle relaxant and analgesic.    #5.  The patient was scheduled for an MRI of the lumbar spine to assess for any focal or segmental spinal pathology such as spinal stenosis.    #6.  The patient will followup in my office after the aformentioned physical rehabilitation measures, medication protocols, and imaging to assess progress and discuss further options if needed.    Thank you for allowing me to evaluate your patient, [name], as a new patient consultation.  Please feel free to contact me with any questions you may have.    Sincerely,    Eulah Walkup K. Carolanne GrumblingPatel M.D.

## 2018-09-22 ENCOUNTER — Encounter: Payer: Self-pay | Admitting: Orthopedic Surgery

## 2018-09-22 ENCOUNTER — Ambulatory Visit: Payer: Medicaid (Managed Care) | Attending: Orthopedic Surgery | Admitting: Orthopedic Surgery

## 2018-09-22 VITALS — BP 133/80 | HR 81 | Ht 68.0 in | Wt 153.0 lb

## 2018-09-22 DIAGNOSIS — Z9889 Other specified postprocedural states: Secondary | ICD-10-CM

## 2018-09-22 NOTE — Progress Notes (Signed)
[name]:   Glosser, Irelyn  MR #:  [id number]   CSN:  [id number] DOB:  July 30, 1976   DICTATED BY:  Chaya Jananiel G Santa Abdelrahman, PA DATE OF VISIT:  09/22/2018     CHIEF COMPLAINT:  Six weeks post right knee arthroscopic surgery.    INTERVAL HISTORY:  [name] states that she is doing well.  She is progressing with physical therapy.  She still feels as though the quad is somewhat weak as she noticed going up and down stairs can be somewhat difficult, but overall is pleased with the results of surgery.  She feels as though she has gained back most of her range of motion.  She states the incisions have healed.     Past medical, surgical, family, social history, medications, allergies, and review of systems were once again reviewed per electronic medical record and unchanged from previous visit.    PHYSICAL EXAMINATION:  [name] is a 42 year old woman, alert and oriented x3, pleasant mood and affect, appears in no acute distress.  Incisions are well healed.  She can fully extend the knee to 0 degrees.  Flexion is to 120.  No pain to palpation in quadriceps, patellar tendon.  No joint line tenderness.  Ligamentous exam is stable.  Calves are soft, nontender bilaterally.  Distal neurovascular exam intact.    ASSESSMENT AND PLAN:  [name] is a 42 year old woman now 6 weeks post knee arthroscopic surgery.  At this time, we will have her continue with physical therapy as she has approximately 2-3 visits left and she should maximize these visits.  Continue working on building the quad, stretching the hamstring.  She will follow up with us on an as-needed basis.  If she should have any further questions or concerns, feel free to contact the office.       Dictated By:  Chaya Jananiel G Breyonna Nault, PA      ______________________________  Arvil PersonsBrian D Giordano, MD    DGK/MODL  DD:  09/22/2018 08:56:13  DT:  09/22/2018 09:07:25  Job #:  [id number]    cc:

## 2018-09-24 ENCOUNTER — Ambulatory Visit: Payer: Medicaid (Managed Care) | Attending: Orthopedic Surgery | Admitting: Physical Therapy

## 2018-09-24 ENCOUNTER — Ambulatory Visit
Admission: RE | Admit: 2018-09-24 | Discharge: 2018-09-24 | Disposition: A | Discharge status: Home / Self Care | Payer: Medicaid (Managed Care) | Source: Ambulatory Visit

## 2018-09-24 DIAGNOSIS — M48061 Spinal stenosis, lumbar region without neurogenic claudication: Secondary | ICD-10-CM | POA: Insufficient documentation

## 2018-09-24 DIAGNOSIS — S8991XD Unspecified injury of right lower leg, subsequent encounter: Secondary | ICD-10-CM

## 2018-09-24 DIAGNOSIS — M5416 Radiculopathy, lumbar region: Secondary | ICD-10-CM

## 2018-09-24 DIAGNOSIS — M4807 Spinal stenosis, lumbosacral region: Secondary | ICD-10-CM

## 2018-09-24 DIAGNOSIS — M4726 Other spondylosis with radiculopathy, lumbar region: Secondary | ICD-10-CM

## 2018-09-24 DIAGNOSIS — M5116 Intervertebral disc disorders with radiculopathy, lumbar region: Secondary | ICD-10-CM

## 2018-09-24 NOTE — Progress Notes (Signed)
[facility]/Spine Rehabilitation  PT Treatment Note    Todays Date: 09/24/2018    Name: [name]  DOB: 05/22/1976  Referring Physician: Chaya JanKleehammer, Daniel G, PA  Diagnosis: Right knee arthroscopy, partial lateral meniscectomy 20%, synovectomy infrapatellar, intraarticular notch, medial compartment, lateral compartment and patellofemoral compartment, chondroplasty patellofemoral and lateral compartment.  Date of Surgery: 08/17/18    Per previous encounters: [name] arrives to PT reporting third surgery on her right knee. 1st surgery was ACL-R 04/2015 out of network. 08/2016 arthroscopy with Dr. Idamae LusherGiordano. Her symptoms of right knee burning pain, instability started in June. Severe scoliosis and sciatica may impact prognosis. Work status: Volunteers, at [employer], Stresses/physical demands of home: 3 flights of stair at home, lives alone, Sport(s)/Activities: Walking                  Visit #: 5    Subjective:  Pain Assessment: 3  Pt reports an improvement in ability to perform ambulation activities since prior treatment session. She notices improvement in strength and endurance for functional activities including ambulation and stair negotiation. She reports that low back pain is her c/c at this time. She will be having an evaluation on her back next week.      Objective:  ROM -  Right, Knee WFL, 3-0-140  Strength - Therapeutic Exercises per flowsheet, Functional activities as charted, Home program   Function: - Improved   Observation: right thoracolumbar scoliosis  Education:  Updated HEP, Verbal cues for ther ex, Gait training, Joint ed concepts, Independent symptom management techniques, Activity modification, Biofeedback  Treatment:  Biofeedback, Cold pack, Functional/Therapeutic activites per flowsheet, Ther Exercise per flowsheet    Assessment:   [name] approaching 6 weeks s/p right knee arthroscopy. Her ROM has improved significantly and she tolerated all new therapeutic exercise progressions  today with decreased control noted during multi-directional stepping. HEP updated appropriately and we will continue with progression of strengthening as tolerated and appropriate.        Plan of Care:  Continue per Plan of care -  As written; Patient would benefit from skilled rehabilitation services to address the above impairments to restore functional capacity.    [facility]    Sharin GraveAshley Lynn Anieya Helman, [place]     Minutes   Time Based CPT  Physical Performance test, Therapeutic Exercise, Therapeutic Activities, NM Re-Education, Manual Therapy, Gait Training, Massage, Aquatic Therapy, Canalith Repositioning, Iontophoresis, Ultrasound, Orthotic fitting/training, Prosthetic fitting 45   Service Based (untimed) CPT  PT/OT evaluation, PT/OT re-eval, E-stim-unattended, Mechanical traction, Vasopneumatic device    Unbilled time (rest, etc)        Total Treatment Time 45     DOS: 08/17/18   09/24/18   - V3   Recumbent bike 10'   Step ups 6", 3x10   Step downs FP, 4", 3x10   Step downs --->       Mini lunge SP, FP, 3x10 BLE           Heel prop/calf stretch 10/10", 5'   SLR/ hip abd 30x   Heel raises 30x   Step ups  6", 2x10   SLS -->   Standing hip abd 2x10         Ice/elevate/ankle pumps 10'   Status:  POC: 11/21/18    Access Code: [email or web address]   URL: [email or web address]   Date: 08/21/2018   Prepared by: Weyman CroonAshley Rasa Degrazia

## 2018-10-01 ENCOUNTER — Ambulatory Visit: Payer: Medicaid (Managed Care) | Admitting: Physical Therapy

## 2018-10-01 ENCOUNTER — Other Ambulatory Visit
Admission: RE | Admit: 2018-10-01 | Discharge: 2018-10-01 | Disposition: A | Discharge status: Home / Self Care | Payer: Medicaid (Managed Care) | Source: Ambulatory Visit | Attending: General Practice | Admitting: General Practice

## 2018-10-01 ENCOUNTER — Other Ambulatory Visit: Payer: Self-pay | Admitting: General Practice

## 2018-10-01 DIAGNOSIS — Z79891 Long term (current) use of opiate analgesic: Secondary | ICD-10-CM | POA: Insufficient documentation

## 2018-10-01 DIAGNOSIS — S8991XD Unspecified injury of right lower leg, subsequent encounter: Secondary | ICD-10-CM

## 2018-10-01 LAB — PAIN CLINIC PROFILE
Amphetamine,UR: NEGATIVE
Benzodiazepinen,UR: NEGATIVE
Cocaine/Metab,UR: NEGATIVE
Opiates,UR: NEGATIVE
Oxycodone/Oxymorphone,UR: POSITIVE
THC Metabolite,UR: POSITIVE

## 2018-10-01 NOTE — Progress Notes (Signed)
[facility] Orthopedic Sports/Spine Rehabilitation  PT Treatment Note    Todays Date: 10/02/2018    Name: [name]  DOB: 1975/12/22  Referring Physician: Pryor Ochoa, PA  Diagnosis: Right knee arthroscopy, partial lateral meniscectomy 20%, synovectomy infrapatellar, intraarticular notch, medial compartment, lateral compartment and patellofemoral compartment, chondroplasty patellofemoral and lateral compartment.  Date of Surgery: 08/17/18    Per previous encounters: Bera arrives to PT reporting third surgery on her right knee. 1st surgery was ACL-R 04/2015 out of network. 08/2016 arthroscopy with Dr. Maryjo Donnelly. Her symptoms of right knee burning pain, instability started in June. Severe scoliosis and sciatica may impact prognosis. Work status: Volunteers, at [employer], Stresses/physical demands of home: 3 flights of stair at home, lives alone, Sport(s)/Activities: Walking                  Visit #: 6    Subjective:  Pain Assessment: 3  Pt reports an improvement in ability to perform ambulation activities since prior treatment session. She notices improvement in strength and endurance for functional activities including ambulation and stair negotiation. She reports that low back pain is her c/c at this time. She will be having an evaluation on her back next week.      Objective:  ROM -  Right, Knee WFL, 3-0-140  Strength - Therapeutic Exercises per flowsheet, Functional activities as charted, Home program   Function: - Improved   Observation: right thoracolumbar scoliosis  Education:  Updated HEP, Verbal cues for ther ex, Gait training, Joint ed concepts, Independent symptom management techniques, Activity modification, Biofeedback  Treatment:  Biofeedback, Cold pack, Functional/Therapeutic activites per flowsheet, Ther Exercise per flowsheet    Assessment:   Shamari >6 weeks s/p right knee arthroscopy. Her ROM has improved significantly and she tolerated all new therapeutic exercise progressions today  with decreased control noted during multi-directional stepping. HEP updated appropriately and we will continue as needed if right knee symptoms regress. Otherwise she will continue with PT POC for c/o chronic LBP upon evaluation in upcoming weeks.      Short Term Goals: (10/02/18): ALL MET  Decrease c/o max pain to < 4/10, Increase ROM to painfree AROM symmetrical to LLE and Minimal assistance with HEP/ education concepts  Long Term Goals: (11/21/18): progressing    Pain/Sx 0 - minimal, ROM/ flexibility WNL , Restoration of functional strength, Independent with HEP/education , Functional return to ADLs / activities without limitations     Plan of Care:  Continue per Plan of care -  As written    Thank you for referring this patient to [facility], PT     Minutes   Time Based CPT  Physical Performance test, Therapeutic Exercise, Therapeutic Activities, NM Re-Education, Manual Therapy, Gait Training, Massage, Aquatic Therapy, Canalith Repositioning, Iontophoresis, Ultrasound, Orthotic fitting/training, Prosthetic fitting 45   Service Based (untimed) CPT  PT/OT evaluation, PT/OT re-eval, E-stim-unattended, Mechanical traction, Vasopneumatic device    Unbilled time (rest, etc)        Total Treatment Time 45     DOS: 08/17/18   10/01/18   - V4   Recumbent bike 10'   Step ups 6", 3x10   Step downs FP, 4", 3x10       Mini lunge SP, FP, 3x10 BLE           Heel prop/calf stretch 10/10", 5'   SLR/ hip abd 30x   Heel raises 30x  Step ups  6", 2x10   Standing hip abd 2x10       Ice/elevate/ankle pumps 10'     Access Code: [email or web address]   URL: [email or web address]   Date: 10/01/2018   Prepared by: Jossie Ng

## 2018-10-05 ENCOUNTER — Ambulatory Visit
Payer: Medicaid (Managed Care) | Attending: Physical Medicine and Rehabilitation | Admitting: Physical Medicine and Rehabilitation

## 2018-10-05 ENCOUNTER — Ambulatory Visit: Payer: Medicaid (Managed Care) | Admitting: Rehabilitative and Restorative Service Providers"

## 2018-10-05 ENCOUNTER — Encounter: Payer: Self-pay | Admitting: Physical Medicine and Rehabilitation

## 2018-10-05 VITALS — BP 140/92 | HR 68 | Ht 68.0 in | Wt 155.0 lb

## 2018-10-05 DIAGNOSIS — M4807 Spinal stenosis, lumbosacral region: Secondary | ICD-10-CM

## 2018-10-05 DIAGNOSIS — M5416 Radiculopathy, lumbar region: Secondary | ICD-10-CM

## 2018-10-05 DIAGNOSIS — M48061 Spinal stenosis, lumbar region without neurogenic claudication: Secondary | ICD-10-CM

## 2018-10-05 NOTE — Progress Notes (Signed)
[facility] SPINE THERAPY  LUMBAR SPINE EVALUATION    10/05/2018  Diagnosis:   1. Spinal stenosis of lumbar region without neurogenic claudication       Onset date: chronic- years    Right knee menisectomy: 08/17/2018  SUBJECTIVE    [name] is a 42 y.o. female who is present today for back care.  Mechanism of injury/history of symptoms: No specific cause States no real cause of the pain, it is worse in the winter and may be due to moving to [place] about 3 years ago. Does have a history of sciatica on the right side and exercises did help it. States this is a different pain than her sciatic pain. States sneezing does bother her back. States that she gets a burning pian. States that she has had recent knee surgery and thinks that may have flared up her back. States she did just come from the MD office but that note is not yet available to be read.  TENS does help, medication does ease the pain- is not currently on an anti-inflammatory as she has a stomach ulcer.      Nature of Symptoms  Relevant symptoms: burning pain, sharp and shooting pain   Symptom frequency: Constant  Symptom intensity (0 - 10 scale): Now 6 Best 5 Worst 8  Symptom location: right sided lower back pain, does get into the right posterior leg  Symptoms worsen with: stairs, prolonged sitting, twisting,    Symptoms improve with: Rest, Medication   Prior history of low back, right hip and leg pain.  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    OBJECTIVE    Observation and Postural Assessment  Patient was able to ambulate into the department with a normal gait pattern.      Posture  Cervcal lordosis: normal  Thoracic kyphosis:increased  Lumbar lordosis: increased    Lateral Deviation: LEFT lateral trunk shift (shoulders left of pelvis)   Prominent left side rib hump    LUMBAR AROM / Movement pattern  Painful left rotation but normal ROM  Painful flexion at end range but reaches to mid to distal shin  Painful and limited extension ROM    Neuromuscular Assessment  Sensation:  LE  Light touch, Intact to gross screen      Palpation and Segmental Mobility    Pain over right sided paraspinals    Directional Preference Testing  Sitting Correction: symptoms reduced:  yes  Standing Correction: symptoms reduced:  yes  Directional Preference: Flexion bias    Hip Screen:   FADIR,  negative  FABER,  negative  Hip Scouring,  negative    Special Tests  Lumbar:  Slump, Right LE  negative  Straight Leg Raise,  Right LE  negative  Elys,  Right LE  negative  Neurodynamic: NA  Sacroilliac Joint:  NA       Flexibility:   Hamstrings: WNL  Quads: WNL  Psoas: WNL    Neuromuscular Control  Isometric: Ability to brace abdomen in sitting/supine -  Abdominal Activation: yes    Mobility / Movement  Rolling:  Level of assist,independent /  Sit<->Stand:  Level of assistance, independent /           Segmental Mobility Assessment  Hypomobile              ASSESSMENT  Findings consistent with 42 y.o. female with lumbar stenosis and scoliosis with pain, ROM limitations, strength limitations, functional limitations.  Significant presentation and prognostic factors include moderate symptom rating, medium disability rating, and  stable clinical status.   Based on clinical evaluation and assessment, the primary rehabilitation approach will include symptom modulation, functional optimization.    Personal factors affecting treatment/recovery:   Lives alone   Unemployed   Uneducated  Comorbidities affecting treatment/recovery:   Right knee surgery   Fibromyalgia   Migraines   Depression   Nicotine dependence   anxiety  Clinical presentation:   stable  Patient complexity:     moderate level as indicated by above stability of condition, personal factors, environmental factors and comorbidities in addition to patient symptom presentation and impairments found on physical exam.    Prognosis:  Good    Contraindications/Precautions/Limitation:  Per diagnosis    Short Term Goals:4228month(s) Increase ROM WFL and pain free, Increase strength x30 sec mod front plank and Minimal assistance with HEP/ education concepts   Long Term Goals:7650month(s) Walk community distances without increased symptoms, Stand as long as needed when completing ADL's without increased symptoms, Sit as long as needed with ADL's without increased symptoms, Transfer from sit to stand and perform bed mobility without increased symptoms, Patient demonstrates improved functional core strength, Patient will return to full prior level of function  , Independent with symptom management    TREATMENT PLAN  Patient/family involved in developing goals and treatment plan: Yes    Recommended Treatment Freq 1-2 times per week(s) for 3 month(s)    Treatment plan inclusive of:  Exercise:Stretching, Strengthening, Progressive Resistive, Aerobic exercise  Manual Techniques:Joint mobilization   Modalities:  Cold pack, Joint Mobilizations, Moist heat, Neuromuscular Re-ed,  Activity posture, Ther Exercise per flowsheet  Functional: Proprioception/Dynamic stability, Functional rehab, Postural training      Thank you [facility] the [facility] of this [facility] to [facility].    Dayshawn Irizarry CHRISTINE  Kavan Devan, PT,DPT     Minutes   Time Based CPT  Physical Performance test, Therapeutic Exercise, Therapeutic Activities, NM Re-Education, Manual Therapy, Gait Training, Massage, Aquatic Therapy, Canalith Repositioning, Iontophoresis, Ultrasound, Orthotic fitting/training, Prosthetic fitting 15   Service Based (untimed) CPT  PT/OT evaluation, PT/OT re-eval, E-stim-unattended, Mechanical traction, Vasopneumatic device 30   Unbilled time (rest, etc)        Total Treatment Time 45       Bike -->       Knee to Chest x10   Fig 4 Stretch x1 mins       Ab Brace x10 hold 5-10 sec (supine)   Glute Brace x10 hold 5-10 sec (suipne)   Bridge x10   Supine March -->   Curl Up -->                   Heat  -->

## 2018-10-06 ENCOUNTER — Encounter: Payer: Self-pay | Admitting: Physical Medicine and Rehabilitation

## 2018-10-06 NOTE — Progress Notes (Signed)
Dear Dr. Leola BrazilSong, Soonil, MD:    Today I had the pleasure of reevaluating your patient, [name], in followup.  Patient was recently seen for symptoms of lumbosacral axial and right lateral lower extremity lumbar radicular pain in the setting of a neurogenic claudication-type presentation and an MRI was ordered and is available for review.  The patient continues to complain of worsening axial and right lateral lower extremity lumbar radicular pain, qualifies it as aching throbbing and burning, and reports the pain has an intensity up to 9/10 and is progressive. Exacerbating factors include prolonged standing and walking.  Mitigating factors include sitting.    Social history/functional history: [name] Brinkngela Sigmon reports limitations with activity level, hobbies, and mobility due to the patient's pain.    Review of systems: The patient denies any lower bladder dysfunction constitutional symptoms were progressive extremity weakness.    Physical examination/focused musculoskeletal/spine examination:    Alert awake appropriately oriented female in no acute distress.  Patient demonstrates the ability heel walk, toe walk, and tandem walk without difficulty or pain.palpation of the lumbar spinous processes paraspinal muscles and bilateral trochanteric bursa were not pain provoking. Straight leg raise reproduced the patient's lower extremity pain on the right.  Patrick's maneuver was negative bilaterally.  Palpation over the sacral sulcus was not pain provoking bilaterally.  Range of motion of the hips and knees were not pain provoking bilaterally. Strength is intact 5 out 5 lower extremities bilaterally throughout all lumbar myotomes. Lumbar flexion is achieved to the knees and not pain provoking. Lumbar extension is limited by pain.    Radiological review:    #1.  MRI of the lumbar spine dated September 24, 2018 is available for review demonstrating a degenerative lumbar scoliosis.  There is moderate right greater than left  foraminal stenosis L5-S1.  There is mild right foraminal greater than recess stenosis at L4-L5.  There is mild bilateral foraminal stenosis at L3-L4.    Impression:    #1.  Worsening right L4 and L5 lumbar radiculopathy pain and axial pain in the setting of a strong neurogenic claudication-type presentation in a patient with mild right foraminal greater than recess stenosis at L4-L5 and moderate right foraminal stenosis at L5-S1 in a patient with a degenerative lumbar scoliosis    Therapeutic plan:    #1.  [name] BrinkAngela Holland and I discussed the importance of following up with physical therapy and physical rehabilitation program as previously prescribed and instructed.    #2.  The patient will be scheduled for therapeutic right L4 and L5 transforaminal nerve root sleeve injections under fluoroscopic guidance.    #3.  The patient and I did discuss the option of seeing one of our fellowship trained spine surgeons to discuss decompression surgery but the patient does not want to consider this at this time.    #4.  [name] Brinkngela Frenz will follow-up in my office after the aforementioned continued physical rehabilitation efforts and interventional procedures to assess progress and discuss further options if necessary.    Thank you for allowing me to participate in the care of [name] Brinkngela Lave.  Please feel free to contact me with any questions he may have.    Sincerely    Eddie Koc K. Carolanne GrumblingPatel M.D.

## 2018-10-16 ENCOUNTER — Ambulatory Visit: Payer: Medicaid (Managed Care) | Admitting: Rehabilitative and Restorative Service Providers"

## 2018-10-16 DIAGNOSIS — M48061 Spinal stenosis, lumbar region without neurogenic claudication: Secondary | ICD-10-CM

## 2018-10-16 NOTE — Progress Notes (Signed)
[facility]  PT Treatment Note    Todays Date: 10/16/2018    Name: Glennis Brinkngela Salois  DOB: 02/22/1976  Referring Physician: Hermenia BersPatel, Rajeev, MD  Diagnosis:   1. Spinal stenosis of lumbar region without neurogenic claudication         Visit #: 2    Subjective:  Pain Assessment: 3  Pt reports an improvement in pain intensity and pain frequency since previous treatment session.       Objective:  ROM -   Bilateral, Back,   Strength - Therapeutic Exercises per flowsheet  Function: - Unchanged  Education:  Updated HEP, Verbal cues for ther ex, Manual cues for ther ex, Joint ed concepts, Spine ed concepts    Objective       Bike -->       Knee to Chest x10   Fig 4 Stretch x1 mins       Ab Brace x10 hold 5-10 sec (supine)   Glute Brace x10 hold 5-10 sec (suipne)   Bridge x10   Supine March -->   Curl Up 2x10       Hip Extension over edge of table x10 bilat   Forward Clock Step x10       Heat  -->         Treatment:  Moist heat, Neuromuscular Re-ed,  Activity proprioception, posture, Ther Exercise per flowsheet    Assessment:   Tolerates therapy well with progression sin strengthening as shown above. Overall decreased pain and increased function at home.       Plan of Care:  Continue per Plan of care -  As written; Patient would benefit from skilled rehabilitation services to address the above impairments to restore functional capacity.    [facility]    Damyia Strider Vonita MossHRISTINE Dawan Farney, PT,DPT       Minutes   Time Based CPT  Physical Performance test, Therapeutic Exercise, Therapeutic Activities, NM Re-Education, Manual Therapy, Gait Training, Massage, Aquatic Therapy, Canalith Repositioning, Iontophoresis, Ultrasound, Orthotic fitting/training, Prosthetic fitting 45   Service Based (untimed) CPT  PT/OT evaluation, PT/OT re-eval, E-stim-unattended, Mechanical traction, Vasopneumatic device     Unbilled time (rest, etc)        Total Treatment Time 45

## 2018-10-19 ENCOUNTER — Ambulatory Visit: Payer: Medicaid (Managed Care) | Admitting: Rehabilitative and Restorative Service Providers"

## 2018-10-19 DIAGNOSIS — M48061 Spinal stenosis, lumbar region without neurogenic claudication: Secondary | ICD-10-CM

## 2018-10-19 NOTE — Progress Notes (Signed)
[facility]  PT Treatment Note    Todays Date: 10/19/2018    Name: [name]  DOB: 06/30/1976  Referring Physician: Hermenia BersPatel, Rajeev, MD  Diagnosis:   1. Spinal stenosis of lumbar region without neurogenic claudication         Visit #: 3    Subjective:  Pain Assessment: 3  Pt reports an improvement in pain intensity and pain frequency since previous treatment session.  States she is doing well, did have muscle soreness after last visit but her back and hips feel better.     Objective:  ROM -   Bilateral, Back,   Strength - Therapeutic Exercises per flowsheet  Function: - Unchanged  Education:  Updated HEP, Verbal cues for ther ex, Manual cues for ther ex, Joint ed concepts, Spine ed concepts    Objective       Bike x5 mins       Knee to Chest x10   Fig 4 Stretch x1 mins       Ab Brace x10 hold 5-10 sec (supine)   Glute Brace x10 hold 5-10 sec (suipne)   Bridge x10   Supine March 2x10 hold 5 sec   Curl Up 2x10   Prone hip extension 2x10       Hip Extension over edge of table x10 bilat   Table Plank x10 sec hold  5x   Forward Clock Step x10       Heat  -->         Treatment:  Moist heat, Neuromuscular Re-ed,  Activity proprioception, posture, Ther Exercise per flowsheet    Assessment:   Tolerates therapy well with progressions in core strength as shown per bolded exercises above.        Plan of Care:  Continue per Plan of care -  As written; Patient would benefit from skilled rehabilitation services to address the above impairments to restore functional capacity.    [facility] you [facility] this patient [facility]    Sherryll Skoczylas Vonita MossHRISTINE Malorie Bigford, PT,DPT       Minutes   Time Based CPT  Physical Performance test, Therapeutic Exercise, Therapeutic Activities, NM Re-Education, Manual Therapy, Gait Training, Massage, Aquatic Therapy, Canalith Repositioning, Iontophoresis, Ultrasound, Orthotic fitting/training,  Prosthetic fitting 45   Service Based (untimed) CPT  PT/OT evaluation, PT/OT re-eval, E-stim-unattended, Mechanical traction, Vasopneumatic device    Unbilled time (rest, etc)        Total Treatment Time 45

## 2018-10-23 ENCOUNTER — Other Ambulatory Visit: Payer: Self-pay | Admitting: Physical Medicine and Rehabilitation

## 2018-10-23 ENCOUNTER — Encounter: Payer: Self-pay | Admitting: Physical Medicine and Rehabilitation

## 2018-10-23 ENCOUNTER — Ambulatory Visit
Admission: RE | Admit: 2018-10-23 | Discharge: 2018-10-23 | Disposition: A | Discharge status: Home / Self Care | Payer: Medicaid (Managed Care) | Source: Ambulatory Visit | Attending: Physical Medicine and Rehabilitation | Admitting: Physical Medicine and Rehabilitation

## 2018-10-23 ENCOUNTER — Ambulatory Visit: Payer: Medicaid (Managed Care) | Admitting: Physical Medicine and Rehabilitation

## 2018-10-23 VITALS — BP 132/91 | HR 86 | Resp 18 | Ht 68.0 in | Wt 155.0 lb

## 2018-10-23 DIAGNOSIS — M5416 Radiculopathy, lumbar region: Secondary | ICD-10-CM | POA: Insufficient documentation

## 2018-10-23 DIAGNOSIS — M545 Low back pain, unspecified: Secondary | ICD-10-CM

## 2018-10-23 MED ORDER — IOHEXOL 240 MG/ML (OMNIPAQUE) IV SOLN *I*
2.0000 mL | Freq: Once | INTRAMUSCULAR | Status: AC | PRN
Start: 2018-10-23 — End: 2018-10-23
  Administered 2018-10-23: 2 mL

## 2018-10-23 MED ORDER — BETAMETHASONE ACET & SOD PHOS 6 (3-3) MG/ML IJ SUSP *I*
18.0000 mg | Freq: Once | INTRAMUSCULAR | Status: AC | PRN
Start: 2018-10-23 — End: 2018-10-23
  Administered 2018-10-23: 18 mg

## 2018-10-23 MED ORDER — LIDOCAINE HCL 1% IJ SOLN (PF) (AMB) *I*
2.0000 mL | Freq: Once | INTRAMUSCULAR | Status: AC | PRN
Start: 2018-10-23 — End: 2018-10-23
  Administered 2018-10-23: 2 mL

## 2018-10-23 MED ORDER — LIDOCAINE HCL 1 % IJ SOLN *I*
5.0000 mL | Freq: Once | INTRAMUSCULAR | Status: AC | PRN
Start: 2018-10-23 — End: 2018-10-23
  Administered 2018-10-23: 5 mL via SUBCUTANEOUS

## 2018-10-23 NOTE — Procedures (Signed)
Date/Time: 10/23/2018  2:20 PM EST  Epidural Steroid Injection(s): Transforaminal - lumbar or sacral 1st level - CPT [phone] and Transforaminal - lumbar or sacral 2nd additional level - CPT 64484  Laterality: Right  Contrast - Right: 2 mL iohexol 240 MG/ML  Anesthetic medications - Right: 5 mL lidocaine hcl 1 %; 2 mL Lidocaine HCl 1 %  Steroid medication - Right: 18 mg betamethasone acetate & sodium phosphate 6 (3-3) MG/ML    PROCEDURES PERFORMED:  1. Therapeutic right L4 and L5 transforaminal selective nerve root block.    REASON FOR PROCEDURES: Lumbar radicular leg pain.    PROCEDURE:  After obtaining informed consent, the patient was placed in the prone position on the fluoroscopy table. Cycled one minute blood pressure, pulse and pulse oximetry was maintained. The lumbosacral skin was prepped and draped in a sterile fashion with Chloraprep and sterile towels. Conscious sedation was not administered.     After raising a skin wheal with 1% Xylocaine and anesthetizing the subcutaneous tissues, a #22 gauge 3.5 inch spinal needle was placed under fluoroscopic control to a point inferior and lateral to the L4 and L5 pedicles. Approximately 2 mL Omnipaque M-240 contrast dye was injected, demonstrating perineural and anterior epidural spread without vascular uptake in the AP and oblique views. There was opacification of the L4 and L5 nerve root sheaths with epidural reflux of contrast material extending into the anterior epidural space at the L3-L4 and L4-L5 segmental levels. A total of 2 mL of 1% preservative free Lidocaine and 3 mL of 6 mg per mL of preservative free Celestone was instilled around the L4 and L5 nerve roots.    The patient tolerated the procedure well and was transitioned to the post procedure holding area uneventfully. The patient was monitored for adverse allergic, paralytic and hypertensive reactions. The patient was discharged without complication.    EBL: Less than 10mL    Specimens removed:  None.

## 2018-10-23 NOTE — Progress Notes (Signed)
Denies pregnancy. Has driver

## 2018-10-23 NOTE — Patient Instructions (Signed)
Post Spinal Injection/ / Procedure Instructions    Instructions:  Notify the office of the doctor who performed your procedure if you experience any of the following:  · Fever of 100 degrees Fahrenheit or 38 degrees Celsius or greater.  · Excessive swelling or redness at the injection site.  · Excessive pain or numbness that lasts longer than 72 hours after your injections.  · Severe headache that goes away when you lie flat and comes back when you sit upright    Activity:  · Rest after your injection for 24 hours, then return to your usual activities.  This includes any restrictions your physician may have prescribed.  · Do not drive the day of your injection.    Care of the injection site:  · You may ice the injection site for local discomfort.  Cover ice with a cloth and apply light pressure.  Never place ice directly on the skin.   Apply ice for 15 minutes on then off for 1 hour.  Repeat as needed.  · Take band aids off the following day.    For diabetic patients:  · Check your blood sugar level every 8 hours for the next 72 hours.  If it is greater than 250, call your primary care physician.    Medications:  · You may continue taking current medications.  · Remember, the steroid you had injected today usually takes 1 to 3 days to take effect, however it can take up to 7 days.    Contacts/Questions:  In case of an urgent issue or questions:  · Dr. Patel's secretary, [name] can be reached at [phone]  · Dr. Everett's secretary, Sandra can be reached at [phone]  · Dr. Speach's secretary, Ashley can be reached at [phone]  · Dr. Orsini's secretary, Sandra can be reached at [phone]  · Call the answering service at [phone] after 4:30 PM, on the weekend or a holiday.  Ask for the physician on call.    Follow up injection: ABOUT 3 WEEKS    Please call the office in 2 weeks to answer questions that are required by your insurance company in order to get another injection approved by your  insurance.  Once we obtain the authorization, our scheduler Nancy will call to schedule the injection.      For Drs. Patel, Orsini, and Speach please call Rina at [phone]  For Drs.  Everett and Laplante, please call Kimmy at [phone]  If you have not heard within a week from the office after you have answered the questions after your first injection, please call the above number to check on this.    Please refrain from calling the secretaries as the Prior Auth Team is managing the requests for your insurance company.  You can either call the Prior Auth Team or your insurance company.      If you have a scheduled injection and you feel 90% better the day before or the day of the injection, please call Nancy at [phone] to cancel.

## 2018-10-28 ENCOUNTER — Ambulatory Visit
Payer: Medicaid (Managed Care) | Attending: Physical Medicine and Rehabilitation | Admitting: Rehabilitative and Restorative Service Providers"

## 2018-10-28 DIAGNOSIS — M48061 Spinal stenosis, lumbar region without neurogenic claudication: Secondary | ICD-10-CM

## 2018-10-28 DIAGNOSIS — S8991XD Unspecified injury of right lower leg, subsequent encounter: Secondary | ICD-10-CM | POA: Insufficient documentation

## 2018-10-28 NOTE — Progress Notes (Signed)
[facility] Orthopedic Sports/Spine Rehabilitation  PT Treatment Note    Todays Date: 10/28/2018    Name: [name]  DOB: 18-Jun-1976  Referring Physician: Hermenia Bers, MD  Diagnosis:   1. Spinal stenosis of lumbar region without neurogenic claudication         Visit #: 4    Subjective:  Pain Assessment: 3  Pt reports an improvement in pain intensity and pain frequency since previous treatment session.   States doing well, did get an injection to her back last week which is helping.      Objective:  ROM -   Bilateral, Back,   Strength - Therapeutic Exercises per flowsheet  Function: - Unchanged  Education:  Updated HEP, Verbal cues for ther ex, Manual cues for ther ex, Joint ed concepts, Spine ed concepts      Bike x5 mins       Knee to Chest x10   Fig 4 Stretch x1 mins       Ab Brace x10 hold 5-10 sec (supine)   Glute Brace x10 hold 5-10 sec (suipne)   Bridge x10   Supine March 2x10 hold 5 sec with contralateral isometric push   Curl Up 2x10   Prone hip extension 2x10       Hip Extension over edge of table x10 bilat   Table Plank x10 sec hold  5x   Forward Clock Step x10   Mini Squats -->   Leg Press 3x10 70#           Heat  -->         Treatment:  Moist heat, Neuromuscular Re-ed,  Activity proprioception, posture, Ther Exercise per flowsheet    Assessment:   Continues to tolerate therapy fairly. States decreases in pain each visit but that she continues to get lumbar soreness. She did get an injection to her back which she feels is helping some.        Plan of Care:  Continue per Plan of care -  As written; Patient would benefit from skilled rehabilitation services to address the above impairments to restore functional capacity.    Thank you for referring this patient to [facility] Orthopaedics - Sports and Spine Rehabilitation    Kamori Kitchens Vonita Moss, PT,DPT       Minutes   Time Based CPT  Physical Performance test, Therapeutic Exercise, Therapeutic Activities, NM Re-Education, Manual  Therapy, Gait Training, Massage, Aquatic Therapy, Canalith Repositioning, Iontophoresis, Ultrasound, Orthotic fitting/training, Prosthetic fitting 45   Service Based (untimed) CPT  PT/OT evaluation, PT/OT re-eval, E-stim-unattended, Mechanical traction, Vasopneumatic device    Unbilled time (rest, etc)        Total Treatment Time 45

## 2018-11-04 ENCOUNTER — Ambulatory Visit: Payer: Medicaid (Managed Care) | Admitting: Rehabilitative and Restorative Service Providers"

## 2018-11-04 DIAGNOSIS — M48061 Spinal stenosis, lumbar region without neurogenic claudication: Secondary | ICD-10-CM

## 2018-11-04 NOTE — Progress Notes (Signed)
[facility] Orthopedic Sports/Spine Rehabilitation  PT Treatment Note    Todays Date: 11/04/2018    Name: [name]  DOB: 05-16-1976  Referring Physician: Hermenia Bers, MD  Diagnosis:   1. Spinal stenosis of lumbar region without neurogenic claudication         Visit #: 5    Subjective:  Pain Assessment: 1  Pt reports an improvement in pain intensity and pain frequency since previous treatment session.   States she has good and bad days with her pain. States the exercises have been helping her and feel good to do. States yesterday she had a lot of pain and does not quite know why but woke up today and is feeling pretty good.      Objective:  ROM -   Bilateral, Back,   Strength - Therapeutic Exercises per flowsheet  Function: - Unchanged  Education:  Updated HEP, Verbal cues for ther ex, Manual cues for ther ex, Joint ed concepts, Spine ed concepts      Bike x10 mins       Knee to Chest x10   Fig 4 Stretch x1 mins       Bridge 3x10 neutral spine (orange band)   Clamshell Orange 3x10   Supine March 2x10 hold 5 sec with contralateral isometric push   Curl Up 2x10   Prone hip extension        Hip Extension over edge of table x10 bilat   Table Plank x10 sec hold  5x   Forward Clock Step x10   Mini Squats -->   Leg Press 3x10 70#   Steps Up: 4" 2x10  Lateral: 4" 2x10   Seated Therapy Ball Row:  Extension:  D2:                    Heat  -->         Treatment:  Moist heat, Neuromuscular Re-ed,  Activity proprioception, posture, Ther Exercise per flowsheet    Assessment:    Continues to demonstrate a fair tolerance to therapy. No pain during any mobility or strengthening exercises and cueing for proper form and body mechanics through her spine and lower extremities. Overall improving and will continue to progress as tolerated.       Plan of Care:  Continue per Plan of care -  As written; Patient would benefit from skilled rehabilitation services to address the above impairments to restore functional capacity.    Thank you for  referring this patient to [facility] Orthopaedics - Sports and Spine Rehabilitation    Kindall Swaby Vonita Moss, PT,DPT       Minutes   Time Based CPT  Physical Performance test, Therapeutic Exercise, Therapeutic Activities, NM Re-Education, Manual Therapy, Gait Training, Massage, Aquatic Therapy, Canalith Repositioning, Iontophoresis, Ultrasound, Orthotic fitting/training, Prosthetic fitting 45   Service Based (untimed) CPT  PT/OT evaluation, PT/OT re-eval, E-stim-unattended, Mechanical traction, Vasopneumatic device    Unbilled time (rest, etc)        Total Treatment Time 45

## 2018-11-06 ENCOUNTER — Telehealth: Payer: Self-pay

## 2018-11-06 NOTE — Telephone Encounter (Signed)
1.  On a scale of 0-10, 0 being no pain, 10 being worst pain how would you rate your pain today?    6-9/10    2.  As a percentage, how much have you improved since your last injection?  How long did the injection last (ex:  3 months, 2 weeks, etc)?    80% IMPROVEMENT FOR 2 WEEKS    3.  Are you currently doing a Home exercise program/Physical therapy/chiropractic care?    PATIENT IS CURRENTLY ENGAGED IN FORMAL PT AND NOTES SOME IMPROVEMENT    4. Are you taking any pain medications?  If so, have you been able to decrease the medications or take less that prior to injection?    LYRICA 150 MG 3 TIMES DAILY  PERCOCET 5-325 MG TABLET TWICE DAILY  PATIENT WAS ABLE TO DECREASE USING LYRICA TO ONCE DAILY      5. Since your injection, has your function improved?  Were you able to walk, stand or sit for longer duration?  Can you perform activities of daily living with less pain ( Ex: able to wash dishes or cook without sitting, able to care for a child, able to walk dog, do laundry, vacuum, etc)?     PATIENT REPORTS IMPROVED FUNCTION AND WAS ABLE TO WALK, STAND, AND SIT LONGER AFTER INJECTION. SHE IS ABLE TO DO HOUSEWORK LIKE SWEEPING AND DUSTING BETTER AFTER INJECTION. BEFORE INJECTION SHE WAS NOT ABLE TO DO HOUSEWORK WITHOUT ASSISTANCE.

## 2018-11-06 NOTE — Telephone Encounter (Signed)
While on the phone with patient conducting % questionnaire, patient inquires whether she can look into having an injection in her left side also. She would like to ask Dr. Allena Katz whether this is an option for future treatment.

## 2018-11-11 ENCOUNTER — Ambulatory Visit: Payer: Medicaid (Managed Care) | Admitting: Rehabilitative and Restorative Service Providers"

## 2018-11-11 DIAGNOSIS — M48061 Spinal stenosis, lumbar region without neurogenic claudication: Secondary | ICD-10-CM

## 2018-11-11 NOTE — Progress Notes (Signed)
[facility] Orthopedic Sports/Spine Rehabilitation  PT Treatment Note    Todays Date: 11/11/2018    Name: [name]  DOB: May 14, 1976  Referring Physician: Hermenia Bers, MD  Diagnosis:   1. Spinal stenosis of lumbar region without neurogenic claudication         Visit #: 6    Subjective:  Pain Assessment: 1  Pt reports an improvement in pain intensity and pain frequency since previous treatment session.  States she continues to have good and bad days. Thinks she may be over doing it on some days but generally doing well.      Objective:  ROM -   Bilateral, Back,   Strength - Therapeutic Exercises per flowsheet  Function: - Unchanged  Education:  Updated HEP, Verbal cues for ther ex, Manual cues for ther ex, Joint ed concepts, Spine ed concepts      Bike x10 mins       Knee to Chest x10   Fig 4 Stretch x1 mins   Kneeling hip flexor stretch x10 hold 10 seconds       Bridge 3x10 neutral spine (orange band)   Clamshell Orange 3x10   Supine March 2x10 hold 5 sec with contralateral isometric push   Curl Up 2x10   Prone hip extension        Hip Extension over edge of table x10 bilat   Table Plank x10 sec hold  5x   Forward Clock Step    Leg Press    Steps    Seated Therapy Ball Row: Green 3x10  Extension: Orange 3x10  D2:  2# ball 3x10                   Heat  -->         Treatment:  Moist heat, Neuromuscular Re-ed,  Activity proprioception, posture, Ther Exercise per flowsheet    Assessment:   Tolerates therapy well today with progresions in strengthening as shown above. increased core strength with use of therapy ball.       Plan of Care:  Continue per Plan of care -  As written; Patient would benefit from skilled rehabilitation services to address the above impairments to restore functional capacity.    Thank you for referring this patient to [facility] Orthopaedics - Sports and Spine Rehabilitation    Deyton Ellenbecker Vonita Moss, PT,DPT       Minutes   Time Based CPT  Physical Performance test,  Therapeutic Exercise, Therapeutic Activities, NM Re-Education, Manual Therapy, Gait Training, Massage, Aquatic Therapy, Canalith Repositioning, Iontophoresis, Ultrasound, Orthotic fitting/training, Prosthetic fitting 45   Service Based (untimed) CPT  PT/OT evaluation, PT/OT re-eval, E-stim-unattended, Mechanical traction, Vasopneumatic device    Unbilled time (rest, etc)        Total Treatment Time 45

## 2018-11-18 ENCOUNTER — Ambulatory Visit: Payer: Medicaid (Managed Care) | Admitting: Rehabilitative and Restorative Service Providers"

## 2018-11-18 ENCOUNTER — Other Ambulatory Visit: Payer: Self-pay

## 2018-11-18 DIAGNOSIS — M48061 Spinal stenosis, lumbar region without neurogenic claudication: Secondary | ICD-10-CM

## 2018-11-18 NOTE — Progress Notes (Signed)
[facility] Orthopedic Sports/Spine Rehabilitation  PT Treatment Note    Todays Date: 11/18/2018    Name: [name]  DOB: 1976-09-15  Referring Physician: Hermenia Bers, MD  Diagnosis:   1. Spinal stenosis of lumbar region without neurogenic claudication         Visit #: 7    Subjective:  Pain Assessment: 1  Pt reports an improvement in pain intensity and pain frequency since previous treatment session.  States she is doing overall much better with regards to her back pain. Does feel like climbing the 3 flights of stairs to her apartment exacerbates her pain but doing better.      Objective:  ROM -   Bilateral, Back,   Strength - Therapeutic Exercises per flowsheet  Function: - Unchanged  Education:  Updated HEP, Verbal cues for ther ex, Manual cues for ther ex, Joint ed concepts, Spine ed concepts      Bike with heat x10 mins       Knee to Chest x10   Kneeling hip flexor stretch x10 hold 10 seconds       Bridge 3x10 neutral spine (green band)   Clamshell Orange 3x10   Supine March 2x10 hold 5 sec with contralateral isometric push   Curl Up 2x10       Hip Extension over edge of table x10 bilat   Table Plank x10 sec hold; 5x   Leg Press    Steps    Seated Therapy Ball Row: Green 3x10  Extension: Orange 3x10  D2:  2# ball 3x10   Mini Squats x15 tall box                         Treatment:  Moist heat, Neuromuscular Re-ed,  Activity proprioception, posture, Ther Exercise per flowsheet    Assessment:   Tolerates therapy well with progressions in strengthening. Overall she is doing well and is expected to continue to progress core and hip strength for increases in function.       Plan of Care:  Continue per Plan of care -  As written; Patient would benefit from skilled rehabilitation services to address the above impairments to restore functional capacity.    Thank you for referring this patient to [facility] of [facility] Orthopaedics - Sports and Spine Rehabilitation    [name], PT,DPT        Minutes   Time Based CPT  Physical Performance test, Therapeutic Exercise, Therapeutic Activities, NM Re-Education, Manual Therapy, Gait Training, Massage, Aquatic Therapy, Canalith Repositioning, Iontophoresis, Ultrasound, Orthotic fitting/training, Prosthetic fitting 45   Service Based (untimed) CPT  PT/OT evaluation, PT/OT re-eval, E-stim-unattended, Mechanical traction, Vasopneumatic device    Unbilled time (rest, etc)        Total Treatment Time 45

## 2018-11-25 ENCOUNTER — Other Ambulatory Visit: Payer: Self-pay

## 2018-11-25 ENCOUNTER — Ambulatory Visit
Payer: Medicaid (Managed Care) | Attending: Physical Medicine and Rehabilitation | Admitting: Rehabilitative and Restorative Service Providers"

## 2018-11-25 DIAGNOSIS — M48061 Spinal stenosis, lumbar region without neurogenic claudication: Secondary | ICD-10-CM | POA: Insufficient documentation

## 2018-11-25 DIAGNOSIS — S8991XD Unspecified injury of right lower leg, subsequent encounter: Secondary | ICD-10-CM | POA: Insufficient documentation

## 2018-11-26 ENCOUNTER — Other Ambulatory Visit: Payer: Self-pay | Admitting: Physical Medicine and Rehabilitation

## 2018-11-26 DIAGNOSIS — M545 Low back pain, unspecified: Secondary | ICD-10-CM

## 2018-11-26 NOTE — Progress Notes (Signed)
[facility] Orthopedic Sports/Spine Rehabilitation  PT Treatment Note      Name: Shaquella Pennella  DOB: May 03, 1976  Referring Physician: Hermenia Bers, MD  Diagnosis:   1. Spinal stenosis of lumbar region without neurogenic claudication         Visit #: 8    Subjective:  Pain Assessment: 1  Pt reports an improvement in pain intensity and pain frequency since previous treatment session.  Continues to report an improvement in lumbar back pain     Objective:  ROM -   Bilateral, Back,   Strength - Therapeutic Exercises per flowsheet  Function: - Unchanged  Education:  Updated HEP, Verbal cues for ther ex, Manual cues for ther ex, Joint ed concepts, Spine ed concepts      Bike with heat x10 mins       Knee to Chest x10   Kneeling hip flexor stretch x10 hold 10 seconds       Bridge 3x10 neutral spine (green band)   Clamshell    Supine March 2x10 hold 5 sec with contralateral isometric push   Curl Up 3x10 reach for ceiling   Modified Side Plank x10 hold 5 seconds bilateral       Hip Extension over edge of table x10 bilat   Table Bird Dog (unable to kneel due to right knee)  2x10 bilat   Leg Press 3x10 60# SL   Steps    Seated Therapy Ball Row: Green 3x10  Extension: Orange 3x10  D2:  2# ball 3x10   Mini Squats x15 tall box                         Treatment:  Moist heat, Neuromuscular Re-ed,  Activity proprioception, posture, Ther Exercise per flowsheet    Assessment:   Improving pain and function with fatigue post exercise. Overall doing well and will continue to progress as tolerated for no pain with functional activites (such as cooking/cleaning her home)       Plan of Care:  Continue per Plan of care -  As written; Patient would benefit from skilled rehabilitation services to address the above impairments to restore functional capacity.    Thank you for referring this patient to [facility] of [facility] Orthopaedics - Sports and Spine Rehabilitation    Tyeisha Dinan Vonita Moss, PT,DPT       Minutes   Time Based  CPT  Physical Performance test, Therapeutic Exercise, Therapeutic Activities, NM Re-Education, Manual Therapy, Gait Training, Massage, Aquatic Therapy, Canalith Repositioning, Iontophoresis, Ultrasound, Orthotic fitting/training, Prosthetic fitting 45   Service Based (untimed) CPT  PT/OT evaluation, PT/OT re-eval, E-stim-unattended, Mechanical traction, Vasopneumatic device    Unbilled time (rest, etc)        Total Treatment Time 45

## 2018-11-27 ENCOUNTER — Ambulatory Visit: Payer: Medicaid (Managed Care) | Admitting: Physical Medicine and Rehabilitation

## 2018-11-27 ENCOUNTER — Ambulatory Visit
Admission: RE | Admit: 2018-11-27 | Discharge: 2018-11-27 | Disposition: A | Discharge status: Home / Self Care | Payer: Medicaid (Managed Care) | Source: Ambulatory Visit | Attending: Physical Medicine and Rehabilitation | Admitting: Physical Medicine and Rehabilitation

## 2018-11-27 ENCOUNTER — Encounter: Payer: Self-pay | Admitting: Physical Medicine and Rehabilitation

## 2018-11-27 VITALS — BP 150/102 | HR 74 | Resp 16 | Ht 68.0 in | Wt 155.0 lb

## 2018-11-27 DIAGNOSIS — M5416 Radiculopathy, lumbar region: Secondary | ICD-10-CM | POA: Insufficient documentation

## 2018-11-27 DIAGNOSIS — M545 Low back pain, unspecified: Secondary | ICD-10-CM

## 2018-11-27 MED ORDER — IOHEXOL 240 MG/ML (OMNIPAQUE) IV SOLN *I*
2.0000 mL | Freq: Once | INTRAMUSCULAR | Status: AC | PRN
Start: 2018-11-27 — End: 2018-11-27
  Administered 2018-11-27: 2 mL

## 2018-11-27 MED ORDER — BETAMETHASONE ACET & SOD PHOS 6 (3-3) MG/ML IJ SUSP *I*
18.0000 mg | Freq: Once | INTRAMUSCULAR | Status: AC | PRN
Start: 2018-11-27 — End: 2018-11-27
  Administered 2018-11-27: 18 mg

## 2018-11-27 MED ORDER — LIDOCAINE HCL 1 % IJ SOLN *I*
5.0000 mL | Freq: Once | INTRAMUSCULAR | Status: AC | PRN
Start: 2018-11-27 — End: 2018-11-27
  Administered 2018-11-27: 5 mL via SUBCUTANEOUS

## 2018-11-27 MED ORDER — LIDOCAINE HCL 1% IJ SOLN (PF) (AMB) *I*
2.0000 mL | Freq: Once | INTRAMUSCULAR | Status: AC | PRN
Start: 2018-11-27 — End: 2018-11-27
  Administered 2018-11-27: 2 mL

## 2018-11-27 NOTE — Progress Notes (Signed)
Arrives ambulatory for procedure.     Patient confirms having a driver.     Denies any antibiotics or blood thinners.     Denies pregnancy ([place]).

## 2018-11-27 NOTE — Patient Instructions (Signed)
Post Spinal Injection/ / Procedure Instructions    Instructions:  Notify the office of the doctor who performed your procedure if you experience any of the following:  · Fever of 100 degrees Fahrenheit or 38 degrees Celsius or greater.  · Excessive swelling or redness at the injection site.  · Excessive pain or numbness that lasts longer than 72 hours after your injections.  · Severe headache that goes away when you lie flat and comes back when you sit upright    Activity:  · Rest after your injection for 24 hours, then return to your usual activities.  This includes any restrictions your physician may have prescribed.  · Do not drive the day of your injection.    Care of the injection site:  · You may ice the injection site for local discomfort.  Cover ice with a cloth and apply light pressure.  Never place ice directly on the skin.   Apply ice for 15 minutes on then off for 1 hour.  Repeat as needed.  · Take band aids off the following day.    For diabetic patients:  · Check your blood sugar level every 8 hours for the next 72 hours.  If it is greater than 250, call your primary care physician.    Medications:  · You may continue taking current medications.  · Remember, the steroid you had injected today usually takes 1 to 3 days to take effect, however it can take up to 7 days.    Contacts/Questions:  In case of an urgent issue or questions:  · Dr. Patel's secretary, [name] can be reached at [phone]  · Dr. Everett's secretary, Sandra can be reached at [phone]  · Dr. Speach's secretary, Ashley can be reached at [phone]  · Dr. Orsini's secretary, Sandra can be reached at [phone]  · Call the answering service at [phone] after 4:30 PM, on the weekend or a holiday.  Ask for the physician on call.    Follow up injection:    Please call the office in 2 weeks to answer questions that are required by your insurance company in order to get another injection approved by your insurance.  Once  we obtain the authorization, our scheduler Nancy will call to schedule the injection.      For Drs. Patel, Orsini, and Speach please call Rina at [phone]  For Drs.  Everett and Laplante, please call Kimmy at [phone]  If you have not heard within a week from the office after you have answered the questions after your first injection, please call the above number to check on this.    Please refrain from calling the secretaries as the Prior Auth Team is managing the requests for your insurance company.  You can either call the Prior Auth Team or your insurance company.      If you have a scheduled injection and you feel 90% better the day before or the day of the injection, please call Nancy at [phone] to cancel.

## 2018-11-27 NOTE — Procedures (Signed)
Date/Time: 11/27/2018  2:10 PM EST  Epidural Steroid Injection(s): Transforaminal - lumbar or sacral 1st level - CPT [phone] and Transforaminal - lumbar or sacral 2nd additional level - CPT 64484  Laterality: Right  Contrast - Right: 2 mL iohexol 240 MG/ML  Anesthetic medications - Right: 5 mL lidocaine hcl 1 %; 2 mL Lidocaine HCl 1 %  Steroid medication - Right: 18 mg betamethasone acetate & sodium phosphate 6 (3-3) MG/ML    PROCEDURES PERFORMED:  1. Therapeutic right L4 and L5 transforaminal selective nerve root block.    REASON FOR PROCEDURES: Lumbar radicular leg pain.    PROCEDURE:  After obtaining informed consent, the patient was placed in the prone position on the fluoroscopy table. Cycled one minute blood pressure, pulse and pulse oximetry was maintained. The lumbosacral skin was prepped and draped in a sterile fashion with Chloraprep and sterile towels. Conscious sedation was not administered.     After raising a skin wheal with 1% Xylocaine and anesthetizing the subcutaneous tissues, a #22 gauge 3.5 inch spinal needle was placed under fluoroscopic control to a point inferior and lateral to the L4 and L5 pedicles. Approximately 2 mL Omnipaque M-240 contrast dye was injected, demonstrating perineural and anterior epidural spread without vascular uptake in the AP and oblique views. There was opacification of the L4 and L5 nerve root sheaths with epidural reflux of contrast material extending into the anterior epidural space at the L3-L4 and L4-L5 segmental levels. A total of 2 mL of 1% preservative free Lidocaine and 3 mL of 6 mg per mL of preservative free Celestone was instilled around the L4 and L5 nerve roots.    The patient tolerated the procedure well and was transitioned to the post procedure holding area uneventfully. The patient was monitored for adverse allergic, paralytic and hypertensive reactions. The patient was discharged without complication.    EBL: Less than 33mL    Specimens removed:  None.

## 2018-12-02 ENCOUNTER — Encounter: Payer: Self-pay | Admitting: Physical Medicine and Rehabilitation

## 2018-12-02 ENCOUNTER — Ambulatory Visit: Payer: Medicaid (Managed Care) | Admitting: Rehabilitative and Restorative Service Providers"

## 2018-12-02 ENCOUNTER — Other Ambulatory Visit: Payer: Self-pay

## 2018-12-02 DIAGNOSIS — M48061 Spinal stenosis, lumbar region without neurogenic claudication: Secondary | ICD-10-CM

## 2018-12-02 NOTE — Progress Notes (Signed)
[facility] Orthopedic Sports/Spine Rehabilitation  PT Treatment Note      Name: [name]  DOB: [birth year]  Referring Physician: Hermenia Bers, MD  Diagnosis:   1. Spinal stenosis of lumbar region without neurogenic claudication         Visit #: 9    Subjective:  Pain Assessment: 1  Pt reports no significant changes in pain or function since previous treatment session.  States she did get the injection last week and no changes to her symptoms yet.     Objective:  ROM -   Bilateral, Back,   Strength - Therapeutic Exercises per flowsheet  Function: - Unchanged  Education:  Updated HEP, Verbal cues for ther ex, Manual cues for ther ex, Joint ed concepts, Spine ed concepts      Bike with Heat x10 mins       Knee to chest 2x10   Sitting HS Stretch 3x30 seconds   Bridge 2x10   Supine March 2x10    Supine Iso Push 2x10 bilat   Hip Hinge 2x10                                         Treatment:  Moist heat, Neuromuscular Re-ed,  Activity proprioception, posture, Ther Exercise per flowsheet    Assessment:   Tolerates treatment fairly. No large changes in progressions of exercises today.        Plan of Care:  Continue per Plan of care -  As written; Patient would benefit from skilled rehabilitation services to address the above impairments to restore functional capacity.    Thank you for referring this patient to [facility] Orthopaedics - Sports and Spine Rehabilitation    [name], PT,DPT       Minutes   Time Based CPT  Physical Performance test, Therapeutic Exercise, Therapeutic Activities, NM Re-Education, Manual Therapy, Gait Training, Massage, Aquatic Therapy, Canalith Repositioning, Iontophoresis, Ultrasound, Orthotic fitting/training, Prosthetic fitting 45   Service Based (untimed) CPT  PT/OT evaluation, PT/OT re-eval, E-stim-unattended, Mechanical traction, Vasopneumatic device    Unbilled time (rest, etc)        Total Treatment Time 45

## 2018-12-09 ENCOUNTER — Ambulatory Visit: Payer: Medicaid (Managed Care) | Admitting: Rehabilitative and Restorative Service Providers"

## 2018-12-09 DIAGNOSIS — M48061 Spinal stenosis, lumbar region without neurogenic claudication: Secondary | ICD-10-CM

## 2018-12-09 NOTE — Progress Notes (Signed)
[facility] Orthopedic Sports/Spine Rehabilitation  PT Treatment Note      Name: [name]  DOB: Jul 02, 1976  Referring Physician: Hermenia Bers, MD  Diagnosis:   1. Spinal stenosis of lumbar region without neurogenic claudication         Visit #: 10    Subjective:  Pain Assessment: 1  Pt reports no significant changes in pain or function since previous treatment session.  Continues to get a burning pain to her back. Is trying to do her HEP every day and feels good when she is doing it.      Objective:  ROM -   Bilateral, Back,   Strength - Therapeutic Exercises per flowsheet  Function: - Unchanged  Education:  Updated HEP, Verbal cues for ther ex, Manual cues for ther ex, Joint ed concepts, Spine ed concepts    Bike with Heat x10 mins       Knee to chest 2x10   Sitting HS Stretch 3x30 seconds   Bridge 3x10   Supine March 2x10 with isometric push    Supine Iso Push 2x10 bilat   Hip Hinge 2x10 8# wt        Table Plank x10 hold 10 sec   Bird Dog 2x10                             Treatment:  Moist heat, Neuromuscular Re-ed,  Activity proprioception, posture, Ther Exercise per flowsheet    Assessment:   Continues to tolerate treatment fairly with minor progressions in strength. Overall she is doing well and will try to continue to progress strength and function as tolerated.       Plan of Care:  Continue per Plan of care -  As written; Patient would benefit from skilled rehabilitation services to address the above impairments to restore functional capacity.    Thank you for referring this patient to [facility] Orthopaedics - Sports and Spine Rehabilitation    Eilidh Marcano Vonita Moss, PT,DPT       Minutes   Time Based CPT  Physical Performance test, Therapeutic Exercise, Therapeutic Activities, NM Re-Education, Manual Therapy, Gait Training, Massage, Aquatic Therapy, Canalith Repositioning, Iontophoresis, Ultrasound, Orthotic fitting/training, Prosthetic fitting 45   Service Based (untimed)  CPT  PT/OT evaluation, PT/OT re-eval, E-stim-unattended, Mechanical traction, Vasopneumatic device    Unbilled time (rest, etc)        Total Treatment Time 45

## 2018-12-16 ENCOUNTER — Ambulatory Visit: Payer: Medicaid (Managed Care) | Admitting: Rehabilitative and Restorative Service Providers"

## 2018-12-16 DIAGNOSIS — M48061 Spinal stenosis, lumbar region without neurogenic claudication: Secondary | ICD-10-CM

## 2018-12-16 NOTE — Progress Notes (Signed)
[facility] Orthopedic Sports/Spine Rehabilitation  PT Treatment Note      Name: [name]  DOB: 1975/12/09  Referring Physician: Hermenia Bers, MD  Diagnosis:   1. Spinal stenosis of lumbar region without neurogenic claudication         Visit #: 11    Subjective:  Pain Assessment: 1  Pt reports an improvement in pain intensity and pain frequency since previous treatment session.  States decreased pain after doing a twisting type movement to the right side yesterday. States she does still have discomfort ut her pain itself is decreasing.      Objective:  ROM -   Bilateral, Back,   Strength - Therapeutic Exercises per flowsheet  Function: - Unchanged  Education:  Updated HEP, Verbal cues for ther ex, Manual cues for ther ex, Joint ed concepts, Spine ed concepts    Bike with Heat x10 mins       Knee to chest 2x10   Sitting HS Stretch 3x30 seconds   Bridge 3x10   Supine March 2x10 with isometric push        Hip Hinge 2x10 8# wt    Clock Steps with KB 8# x 10 forward and lateral   Table Plank x10 hold 10 sec   Bird Dog 2x10                             Treatment:  Moist heat, Neuromuscular Re-ed,  Activity proprioception, posture, Ther Exercise per flowsheet    Assessment:   Continues to progress well with therapy. Overall she is doing well and is expected to continue to progress her strength and function with less pain.       Plan of Care:  Continue per Plan of care -  As written; Patient would benefit from skilled rehabilitation services to address the above impairments to restore functional capacity.    Thank you for referring this patient to [facility] Orthopaedics - Sports and Spine Rehabilitation    Vonne Mcdanel Vonita Moss, PT,DPT       Minutes   Time Based CPT  Physical Performance test, Therapeutic Exercise, Therapeutic Activities, NM Re-Education, Manual Therapy, Gait Training, Massage, Aquatic Therapy, Canalith Repositioning, Iontophoresis, Ultrasound, Orthotic fitting/training,  Prosthetic fitting 55   Service Based (untimed) CPT  PT/OT evaluation, PT/OT re-eval, E-stim-unattended, Mechanical traction, Vasopneumatic device    Unbilled time (rest, etc)        Total Treatment Time 55

## 2018-12-17 ENCOUNTER — Other Ambulatory Visit: Payer: Self-pay | Admitting: Gastroenterology

## 2019-01-01 ENCOUNTER — Other Ambulatory Visit: Payer: Self-pay | Admitting: Gastroenterology

## 2019-01-12 ENCOUNTER — Other Ambulatory Visit: Payer: Self-pay | Admitting: Medical

## 2019-02-16 ENCOUNTER — Other Ambulatory Visit: Payer: Self-pay | Admitting: Gastroenterology

## 2019-02-16 ENCOUNTER — Other Ambulatory Visit: Payer: Self-pay | Admitting: General Practice

## 2019-02-19 ENCOUNTER — Other Ambulatory Visit: Payer: Self-pay | Admitting: Gastroenterology

## 2019-04-22 ENCOUNTER — Other Ambulatory Visit: Payer: Self-pay | Admitting: Gastroenterology

## 2019-05-07 ENCOUNTER — Ambulatory Visit: Payer: Medicaid (Managed Care) | Admitting: Physical Medicine and Rehabilitation

## 2019-05-07 ENCOUNTER — Ambulatory Visit
Admission: RE | Admit: 2019-05-07 | Discharge: 2019-05-07 | Disposition: A | Discharge status: Home / Self Care | Payer: Medicaid (Managed Care) | Source: Ambulatory Visit | Attending: Physical Medicine and Rehabilitation | Admitting: Physical Medicine and Rehabilitation

## 2019-05-07 ENCOUNTER — Encounter: Payer: Self-pay | Admitting: Physical Medicine and Rehabilitation

## 2019-05-07 ENCOUNTER — Other Ambulatory Visit: Payer: Self-pay | Admitting: Physical Medicine and Rehabilitation

## 2019-05-07 VITALS — BP 147/99 | HR 80 | Temp 98.6°F | Resp 16 | Ht 68.0 in | Wt 160.0 lb

## 2019-05-07 DIAGNOSIS — M5416 Radiculopathy, lumbar region: Secondary | ICD-10-CM | POA: Insufficient documentation

## 2019-05-07 DIAGNOSIS — M545 Low back pain, unspecified: Secondary | ICD-10-CM

## 2019-05-07 MED ORDER — LIDOCAINE HCL 1 % IJ SOLN *I*
5.0000 mL | Freq: Once | INTRAMUSCULAR | Status: AC | PRN
Start: 2019-05-07 — End: 2019-05-07
  Administered 2019-05-07: 5 mL via SUBCUTANEOUS

## 2019-05-07 MED ORDER — BETAMETHASONE ACET & SOD PHOS 6 (3-3) MG/ML IJ SUSP *I*
18.0000 mg | Freq: Once | INTRAMUSCULAR | Status: AC | PRN
Start: 2019-05-07 — End: 2019-05-07
  Administered 2019-05-07: 18 mg

## 2019-05-07 MED ORDER — LIDOCAINE HCL 1% IJ SOLN (PF) (AMB) *I*
2.0000 mL | Freq: Once | INTRAMUSCULAR | Status: AC | PRN
Start: 2019-05-07 — End: 2019-05-07
  Administered 2019-05-07: 2 mL

## 2019-05-07 MED ORDER — IOHEXOL 240 MG/ML (OMNIPAQUE) IV SOLN *I*
2.0000 mL | Freq: Once | INTRAMUSCULAR | Status: AC | PRN
Start: 2019-05-07 — End: 2019-05-07
  Administered 2019-05-07: 2 mL

## 2019-05-07 NOTE — Procedures (Signed)
Date/Time: 05/07/2019 12:30 PM EDT  Epidural Steroid Injection(s): Transforaminal - lumbar or sacral 1st level - CPT [phone] and Transforaminal - lumbar or sacral 2nd additional level - CPT 64484  Laterality: Right  Contrast - Right: 2 mL iohexol 240 MG/ML  Anesthetic medications - Right: 5 mL lidocaine hcl 1 %; 2 mL Lidocaine HCl 1 %  Steroid medication - Right: 18 mg betamethasone acetate & sodium phosphate 6 (3-3) MG/ML    PROCEDURES PERFORMED:  1. Therapeutic right L4 and L5 transforaminal selective nerve root block.    REASON FOR PROCEDURES: Lumbar radicular leg pain.    PROCEDURE:  After obtaining informed consent, the patient was placed in the prone position on the fluoroscopy table. Cycled one minute blood pressure, pulse and pulse oximetry was maintained. The lumbosacral skin was prepped and draped in a sterile fashion with Chloraprep and sterile towels. Conscious sedation was not administered.     After raising a skin wheal with 1% Xylocaine and anesthetizing the subcutaneous tissues, a #22 gauge 3.5 inch spinal needle was placed under fluoroscopic control to a point inferior and lateral to the L4 and L5 pedicles. Approximately 2 mL Omnipaque M-240 contrast dye was injected, demonstrating perineural and anterior epidural spread without vascular uptake in the AP and oblique views. There was opacification of the L4 and L5 nerve root sheaths with epidural reflux of contrast material extending into the anterior epidural space at the L3-L4 and L4-L5 segmental levels. A total of 2 mL of 1% preservative free Lidocaine and 3 mL of 6 mg per mL of preservative free Celestone was instilled around the L4 and L5 nerve roots.    The patient tolerated the procedure well and was transitioned to the post procedure holding area uneventfully. The patient was monitored for adverse allergic, paralytic and hypertensive reactions. The patient was discharged without complication.    EBL: Less than 79mL    Specimens removed:  None.

## 2019-05-07 NOTE — Progress Notes (Signed)
Pt has a driver. No antibiotics/blood thinners. Denies pregnancy

## 2019-05-07 NOTE — Patient Instructions (Signed)
Post Spinal Injection / Procedure Instructions    Instructions:  Notify the office of the doctor who performed your procedure if you experience any of the following:  · Fever of 100 degrees Fahrenheit or 38 degrees Celsius or greater.  · Excessive swelling or redness at the injection site.  · Excessive pain or numbness that lasts longer than 72 hours after your injections.  · Severe headache that goes away when you lie flat and comes back when you sit upright    Activity:  · Rest after your injection for 24 hours, then return to your usual activities.  This includes any restrictions your physician may have prescribed.  · Do not drive the day of your injection.    Care of the injection site:  · You may ice the injection site for local discomfort.  Cover ice with a cloth and apply light pressure.  Never place ice directly on the skin.   Apply ice for 15 minutes on then off for 1 hour.  Repeat as needed.  · Take band aids off the following day.    For diabetic patients:  · Check your blood sugar level every 8 hours for the next 72 hours.  If it is greater than 250, call your primary care physician.    Medications:  · You may continue taking your pre-injection medications as previously prescribed.  · Remember, the steroid you had injected today usually takes 1 to 3 days to take effect, however it can take up to 7 days.    Contacts/Questions:  In case of an urgent issue or questions:  · Dr. Patel's secretary, [name] can be reached at [phone]  · Dr. Everett's secretary, Sandra can be reached at [phone]  · Dr. Speach's secretary, Ashley can be reached at [phone]  · Dr. Orsini's secretary, Sandra can be reached at [phone]  · Call the answering service at [phone] after 4:30 PM, on the weekend or a holiday.  Ask for the physician on call.    Follow-up Office Visit:  You need to call the appointment center at [phone] for a follow-up office visit with R. BARONE in 3-4 weeks or  the next available appointment.

## 2019-06-07 ENCOUNTER — Ambulatory Visit: Payer: Medicaid (Managed Care) | Admitting: Physical Medicine and Rehabilitation

## 2019-06-07 ENCOUNTER — Encounter: Payer: Self-pay | Admitting: Physical Medicine and Rehabilitation

## 2019-06-07 VITALS — BP 153/84 | HR 69 | Ht 68.0 in | Wt 170.0 lb

## 2019-06-07 DIAGNOSIS — M5417 Radiculopathy, lumbosacral region: Secondary | ICD-10-CM

## 2019-06-07 DIAGNOSIS — M48062 Spinal stenosis, lumbar region with neurogenic claudication: Secondary | ICD-10-CM

## 2019-06-07 NOTE — Progress Notes (Signed)
Chief complaint: Follow-up low back right leg pain    HPI: Patient is seen in follow consultation with low back pain radiating down the right lower extremity.  Symptoms are aggravated with walking, standing and prolonged sitting.    Patient underwent a therapeutic right L4 and L5 transforaminal selective nerve root blocks a month ago with an 80% reduction in her pain however the pain is returning.  Preinjection her pain was 9/10 and at its best since the injection her pain can be as low as a 4/10.    Patient endorses compliance with her home exercise program.  She is currently on tramadol, Lyrica and Percocet for pain.    Patient denies  Progressive lower extremity weakness, constitutional symptoms or bowel or bladder dysfunction.    Past Medical History:   Diagnosis Date    Anemia     Anxiety 01/12/2016    Arthritis     Depression     DJD (degenerative joint disease), lumbar     small disc disease    DUB (dysfunctional uterine bleeding)     Fibromyalgia 01/12/2016    GERD (gastroesophageal reflux disease)     High blood pressure     Hypertension     Nicotine dependence 01/12/2016    Peptic ulcer     Sciatica of right side     Sleep apnea      Current Outpatient Medications on File Prior to Visit   Medication Sig Dispense Refill    sucralfate (CARAFATE) 1 GM tablet TAKE 1 TABLET BY MOUTH TWO TIMES A DAY BEFORE MEALS 60 tablet 2    pantoprazole (PROTONIX) 40 MG EC tablet TAKE 1 TABLET BY MOUTH TWO TIMES A DAY. (SWALLOW WHOLE: DO NOT CRUSH, BREAK OR CHEW) 60 tablet 5    diphenhydrAMINE (BENADRYL) 25 MG capsule Take 25 mg by mouth at bedtime      ALBUTEROL IN Inhale into the lungs as needed      Umeclidinium Bromide (INCRUSE ELLIPTA IN) Inhale into the lungs daily      cloNIDine (CATAPRES) 0.2 MG tablet Take 0.2 mg by mouth at bedtime       Medical marijuana as needed      ondansetron (ZOFRAN-ODT) 4 MG disintegrating tablet Take 1 tablet (4 mg total) by mouth 3 times daily as needed   Place on top of  tongue. 30 tablet 0    oxyCODONE-acetaminophen (PERCOCET) 5-325 MG per tablet Take 1 tablet by mouth every 6 hours as needed   Max daily dose: 4 tablets 20 tablet 0    pregabalin (LYRICA) 150 MG capsule Take 150 mg by mouth 3 times daily       amLODIPine (NORVASC) 10 MG tablet Take 1 tablet (10 mg total) by mouth daily 30 tablet 5    ferrous sulfate 325 (65 FE) MG tablet Take 1 tablet (325 mg total) by mouth daily (with breakfast) 100 tablet 3     No current facility-administered medications on file prior to visit.      No Known Allergies (drug, envir, food or latex)    Physical exam:    Vitals:    06/07/19 0805   BP: 153/84   Pulse: 69   Weight: 77.1 kg (170 lb)   Height: 1.727 m (5\' 8" )       Gait without antalgia.  Patient demonstrates heel walk, toe walk and tandem walk without pain or difficulty. Sitting straight leg raise is negative for causing  lower extremity leg pain. Strength is  intact 5 out of 5 in lower extremities across all lumbar myotomes.  Lumbar flexion achieve to distal tibia without pain.  Lumbar extension was pain provoking.    Impression: Recurring low back and right lumbosacral radicular pain l in a patient with mild right foraminal and recess stenosis L4-5 as well as moderate right foraminal stenosis L5-S1 with a degenerative lumbar scoliosis    Plan:    Patient will continue her physical therapy home exercise program.    Patient will continue her current pain medication regimen.    I suggested that she try lidocaine patches for her low back pain.    We will seek authorization for therapeutic right L4 and L5 transforaminal selective nerve root block under fluoroscopic guidance.    Patient will return postinjection to assess her response to treatment and to discuss additional treatment options if necessary.    Please feel free to contact me with any questions or concerns that you may have.    Darrick Grinder, [place]

## 2019-06-11 ENCOUNTER — Telehealth: Payer: Self-pay

## 2019-06-11 NOTE — Telephone Encounter (Signed)
[name] is calling to schedule an appointment.    Appointment Type: npv  Provider: any  What does patient need to be seen for?: acne  Is there a referral on file?: no  Preferred location?: no  Type of insurance: [employer]c  Were the demographics and insurance verified?: yes  Was the patient connected to [facility] if applicable?: no  Was the appropriate expectation set with the patient for a time frame to receive a return call from the office? yes    Patient can be reached at: [phone]

## 2019-06-16 NOTE — Telephone Encounter (Signed)
Attempted to call no VM set up

## 2019-06-29 NOTE — Telephone Encounter (Signed)
Patient returned call and scheduled.

## 2019-06-29 NOTE — Telephone Encounter (Signed)
Patient is returning a call to schedule please call her back

## 2019-06-29 NOTE — Telephone Encounter (Signed)
Called pt again, VM is still not set up

## 2019-07-08 ENCOUNTER — Other Ambulatory Visit: Payer: Self-pay | Admitting: Physical Medicine and Rehabilitation

## 2019-07-08 DIAGNOSIS — M545 Low back pain, unspecified: Secondary | ICD-10-CM

## 2019-07-09 ENCOUNTER — Ambulatory Visit
Admission: RE | Admit: 2019-07-09 | Discharge: 2019-07-09 | Disposition: A | Discharge status: Home / Self Care | Payer: Medicaid (Managed Care) | Source: Ambulatory Visit | Attending: Physical Medicine and Rehabilitation | Admitting: Physical Medicine and Rehabilitation

## 2019-07-09 ENCOUNTER — Ambulatory Visit: Payer: Medicaid (Managed Care) | Admitting: Physical Medicine and Rehabilitation

## 2019-07-09 ENCOUNTER — Encounter: Payer: Self-pay | Admitting: Physical Medicine and Rehabilitation

## 2019-07-09 VITALS — BP 161/95 | HR 71 | Temp 99.0°F | Resp 16 | Ht 68.0 in | Wt 170.0 lb

## 2019-07-09 DIAGNOSIS — M5416 Radiculopathy, lumbar region: Secondary | ICD-10-CM | POA: Insufficient documentation

## 2019-07-09 DIAGNOSIS — M545 Low back pain, unspecified: Secondary | ICD-10-CM

## 2019-07-09 MED ORDER — LIDOCAINE HCL 1 % IJ SOLN *I*
5.0000 mL | Freq: Once | INTRAMUSCULAR | Status: AC | PRN
Start: 2019-07-09 — End: 2019-07-09
  Administered 2019-07-09: 5 mL via SUBCUTANEOUS

## 2019-07-09 MED ORDER — BETAMETHASONE ACET & SOD PHOS 6 (3-3) MG/ML IJ SUSP *I*
18.0000 mg | Freq: Once | INTRAMUSCULAR | Status: AC | PRN
Start: 2019-07-09 — End: 2019-07-09
  Administered 2019-07-09: 18 mg

## 2019-07-09 MED ORDER — IOHEXOL 240 MG/ML (OMNIPAQUE) IV SOLN *I*
2.0000 mL | Freq: Once | INTRAMUSCULAR | Status: AC | PRN
Start: 2019-07-09 — End: 2019-07-09
  Administered 2019-07-09: 2 mL

## 2019-07-09 MED ORDER — LIDOCAINE HCL 1% IJ SOLN (PF) (AMB) *I*
2.0000 mL | Freq: Once | INTRAMUSCULAR | Status: AC | PRN
Start: 2019-07-09 — End: 2019-07-09
  Administered 2019-07-09: 2 mL

## 2019-07-09 NOTE — Progress Notes (Signed)
Patient arrives ambulatory for injection  procedure, confirms having a driver.      Denies currently taking any antibiotics or blood thinners.      Denies pregnancy.

## 2019-07-09 NOTE — Procedures (Signed)
Date/Time: 07/09/2019  1:00 PM EDT  Epidural Steroid Injection(s): Transforaminal - lumbar or sacral 1st level - CPT [phone] and Transforaminal - lumbar or sacral 2nd additional level - CPT 64484  Laterality: Right  Contrast - Right: 2 mL iohexol 240 MG/ML  Anesthetic medications - Right: 5 mL lidocaine hcl 1 %; 2 mL Lidocaine HCl 1 %  Steroid medication - Right: 18 mg betamethasone acetate & sodium phosphate 6 (3-3) MG/ML    PROCEDURES PERFORMED:  1. Therapeutic right L4 and L5 transforaminal selective nerve root block.    REASON FOR PROCEDURES: Lumbar radicular leg pain.    PROCEDURE:  After obtaining informed consent, the patient was placed in the prone position on the fluoroscopy table. Cycled one minute blood pressure, pulse and pulse oximetry was maintained. The lumbosacral skin was prepped and draped in a sterile fashion with Chloraprep and sterile towels. Conscious sedation was not administered.     After raising a skin wheal with 1% Xylocaine and anesthetizing the subcutaneous tissues, a #22 gauge 3.5 inch spinal needle was placed under fluoroscopic control to a point inferior and lateral to the L4 and L5 pedicles. Approximately 2 mL Omnipaque M-240 contrast dye was injected, demonstrating perineural and anterior epidural spread without vascular uptake in the AP and oblique views. There was opacification of the L4 and L5 nerve root sheaths with epidural reflux of contrast material extending into the anterior epidural space at the L3-L4 and L4-L5 segmental levels. A total of 2 mL of 1% preservative free Lidocaine and 3 mL of 6 mg per mL of preservative free Celestone was instilled around the L4 and L5 nerve roots.    The patient tolerated the procedure well and was transitioned to the post procedure holding area uneventfully. The patient was monitored for adverse allergic, paralytic and hypertensive reactions. The patient was discharged without complication.    EBL: Less than 17mL    Specimens removed:  None.

## 2019-07-09 NOTE — Patient Instructions (Signed)
Post Spinal Injection/ / Procedure Instructions    Instructions:  Notify the office of the doctor who performed your procedure if you experience any of the following:  · Fever of 100 degrees Fahrenheit or 38 degrees Celsius or greater.  · Excessive swelling or redness at the injection site.  · Excessive pain or numbness that lasts longer than 72 hours after your injections.  · Severe headache that goes away when you lie flat and comes back when you sit upright    Activity:  · Rest after your injection for 24 hours, then return to your usual activities.  This includes any restrictions your physician may have prescribed.  · Do not drive the day of your injection.    Care of the injection site:  · You may ice the injection site for local discomfort.  Cover ice with a cloth and apply light pressure.  Never place ice directly on the skin.   Apply ice for 15 minutes on then off for 1 hour.  Repeat as needed.  · Take band aids off the following day.    For diabetic patients:  · Check your blood sugar level every 8 hours for the next 72 hours.  If it is greater than 250, call your primary care physician.    Medications:  · You may continue taking current medications.  · Remember, the steroid you had injected today usually takes 1 to 3 days to take effect, however it can take up to 7 days.    Contacts/Questions:  In case of an urgent issue or questions:  · Dr. Patel's secretary, [name] can be reached at [phone]  · Dr. Everett's secretary, Sandra can be reached at [phone]  · Dr. Speach's secretary, Ashley can be reached at [phone]  · Dr. Orsini's secretary, Sandra can be reached at [phone]  · Call the answering service at [phone] after 4:30 PM, on the weekend or a holiday.  Ask for the physician on call.    Follow up injection:    Please call the office in 2 weeks to answer questions that are required by your insurance company in order to get another injection approved by your insurance.  Once  we obtain the authorization, our scheduler Nancy will call to schedule the injection.      For Drs. Patel, Orsini, and Speach please call Rina at [phone]  For Drs.  Everett and Laplante, please call Kimmy at [phone]  If you have not heard within a week from the office after you have answered the questions after your first injection, please call the above number to check on this.    Please refrain from calling the secretaries as the Prior Auth Team is managing the requests for your insurance company.  You can either call the Prior Auth Team or your insurance company.      If you have a scheduled injection and you feel 90% better the day before or the day of the injection, please call Nancy at [phone] to cancel.

## 2019-08-02 ENCOUNTER — Other Ambulatory Visit
Admission: RE | Admit: 2019-08-02 | Discharge: 2019-08-02 | Disposition: A | Discharge status: Home / Self Care | Payer: Medicaid (Managed Care) | Source: Ambulatory Visit | Attending: Obstetrics and Gynecology | Admitting: Obstetrics and Gynecology

## 2019-08-02 DIAGNOSIS — N938 Other specified abnormal uterine and vaginal bleeding: Secondary | ICD-10-CM | POA: Insufficient documentation

## 2019-08-02 LAB — CBC
Hematocrit: 29 % — ABNORMAL LOW (ref 34–45)
Hemoglobin: 8.6 g/dL — ABNORMAL LOW (ref 11.2–15.7)
MCH: 23 pg/cell — ABNORMAL LOW (ref 26–32)
MCHC: 30 g/dL — ABNORMAL LOW (ref 32–36)
MCV: 75 fL — ABNORMAL LOW (ref 79–95)
Platelets: 420 10*3/uL — ABNORMAL HIGH (ref 160–370)
RBC: 3.8 MIL/uL — ABNORMAL LOW (ref 3.9–5.2)
RDW: 23.2 % — ABNORMAL HIGH (ref 11.7–14.4)
WBC: 8.6 10*3/uL (ref 4.0–10.0)

## 2019-08-02 LAB — T4, FREE: Free T4: 1 ng/dL (ref 0.9–1.7)

## 2019-08-02 LAB — PROLACTIN: Prolactin: 15.1 ng/mL

## 2019-08-02 LAB — BHCG, QUANT PREGNANCY: BHCG, QUANT PREGNANCY: <1 m[IU]/mL (ref 0–1)

## 2019-08-02 LAB — TSH: TSH: 1.61 u[IU]/mL (ref 0.27–4.20)

## 2019-08-03 ENCOUNTER — Other Ambulatory Visit: Payer: Self-pay | Admitting: General Practice

## 2019-08-06 ENCOUNTER — Ambulatory Visit: Payer: Medicaid (Managed Care) | Admitting: Dermatology

## 2019-08-06 NOTE — Telephone Encounter (Signed)
Ms. Otterson is calling to cancel her appointment which is currently scheduled for 08/06/19 with Lauren     Reason for the cancellation: not feeling well    Has the appointment been cancelled? yes    Has the appointment been rescheduled? no    Date of new appointment?n/a    Does the patient need a call back to reschedule?yes    Patient can be contacted back at [phone]

## 2019-08-06 NOTE — Telephone Encounter (Signed)
Voicemail not set up , unable to leave a message.

## 2019-08-09 ENCOUNTER — Other Ambulatory Visit: Payer: Self-pay

## 2019-08-09 NOTE — Telephone Encounter (Signed)
Copied from [place] [id number]. Topic: Medications/Prescriptions - Refill Request  >> Aug 09, 2019 11:06 AM Harland Dingwall wrote:  Pharmacy calling in refill for the Sucralfate.    Marland Kitchen

## 2019-08-10 ENCOUNTER — Other Ambulatory Visit: Payer: Self-pay | Admitting: Gastroenterology

## 2019-08-10 MED ORDER — SUCRALFATE 1 GM PO TABS *I*
1.0000 g | ORAL_TABLET | Freq: Two times a day (BID) | ORAL | 2 refills | Status: DC | PRN
Start: 2019-08-10 — End: 2019-08-10

## 2019-08-12 NOTE — Telephone Encounter (Signed)
Voicemail not set up , unable to leave a message.

## 2019-08-12 NOTE — Telephone Encounter (Signed)
Patient has been called twice and will need to call the office back to schedule at [phone].

## 2019-09-08 ENCOUNTER — Other Ambulatory Visit: Payer: Self-pay | Admitting: General Practice

## 2019-09-09 ENCOUNTER — Other Ambulatory Visit: Payer: Self-pay | Admitting: Obstetrics and Gynecology

## 2019-09-11 ENCOUNTER — Other Ambulatory Visit: Payer: Self-pay

## 2019-09-11 MED ORDER — NORETHINDRONE ACETATE 5 MG PO TABS *I*
10.0000 mg | ORAL_TABLET | Freq: Every day | ORAL | 3 refills | Status: DC
Start: 2019-09-11 — End: 2020-04-06
  Filled 2019-09-11 – 2019-10-01 (×2): qty 60, 30d supply, fill #0
  Filled 2019-11-24: qty 60, 30d supply, fill #1
  Filled 2019-12-21: qty 60, 30d supply, fill #2
  Filled 2020-02-07: qty 60, 30d supply, fill #3

## 2019-09-14 ENCOUNTER — Other Ambulatory Visit: Payer: Self-pay

## 2019-09-28 ENCOUNTER — Other Ambulatory Visit: Payer: Self-pay

## 2019-09-30 ENCOUNTER — Other Ambulatory Visit: Payer: Self-pay

## 2019-09-30 ENCOUNTER — Other Ambulatory Visit: Payer: Self-pay | Admitting: Gastroenterology

## 2019-10-01 ENCOUNTER — Other Ambulatory Visit: Payer: Self-pay

## 2019-10-01 MED ORDER — PANTOPRAZOLE SODIUM 40 MG PO TBEC *I*
DELAYED_RELEASE_TABLET | ORAL | 5 refills | Status: DC
Start: 2019-10-01 — End: 2020-04-06
  Filled 2019-10-01: qty 60, 30d supply, fill #0
  Filled 2019-11-03: qty 60, 30d supply, fill #1
  Filled 2019-12-06: qty 60, 30d supply, fill #2
  Filled 2020-01-06: qty 60, 30d supply, fill #3
  Filled 2020-02-07: qty 60, 30d supply, fill #4
  Filled 2020-03-08: qty 60, 30d supply, fill #5

## 2019-10-04 ENCOUNTER — Other Ambulatory Visit: Payer: Self-pay

## 2019-10-05 ENCOUNTER — Other Ambulatory Visit: Payer: Self-pay

## 2019-10-07 ENCOUNTER — Other Ambulatory Visit: Payer: Self-pay

## 2019-10-07 MED ORDER — PREGABALIN 150 MG PO CAPS *I*
150.0000 mg | ORAL_CAPSULE | Freq: Two times a day (BID) | ORAL | 2 refills | Status: DC
Start: 2019-10-07 — End: 2019-11-05
  Filled 2019-10-07: qty 60, 30d supply, fill #0
  Filled 2019-11-03: qty 60, 30d supply, fill #1

## 2019-10-07 MED ORDER — OXYCODONE-ACETAMINOPHEN 5-325 MG PO TABS *I*
1.0000 | ORAL_TABLET | Freq: Three times a day (TID) | ORAL | 0 refills | Status: DC
Start: 2019-10-07 — End: 2019-11-05
  Filled 2019-10-07: qty 90, 30d supply, fill #0

## 2019-10-08 ENCOUNTER — Other Ambulatory Visit: Payer: Self-pay

## 2019-10-08 MED ORDER — QVAR REDIHALER 40 MCG/ACT IN AERB
INHALATION_SPRAY | RESPIRATORY_TRACT | 8 refills | Status: DC
Start: 2019-10-08 — End: 2019-10-11
  Filled 2019-10-08: qty 10.6, 90d supply, fill #0

## 2019-10-08 MED ORDER — POLYETHYLENE GLYCOL 3350 PO POWD *I*
ORAL | 5 refills | Status: DC
Start: 2019-10-08 — End: 2020-03-06
  Filled 2019-10-08: qty 510, 30d supply, fill #0
  Filled 2019-11-24: qty 510, 30d supply, fill #1
  Filled 2019-12-21: qty 510, 30d supply, fill #2
  Filled 2020-02-07: qty 510, 30d supply, fill #3

## 2019-10-08 MED ORDER — FLOVENT HFA 110 MCG/ACT IN AERO
1.0000 | INHALATION_SPRAY | Freq: Two times a day (BID) | RESPIRATORY_TRACT | 5 refills | Status: DC
Start: 2019-10-08 — End: 2019-10-11
  Filled 2019-10-08: qty 12, 30d supply, fill #0

## 2019-10-08 MED FILL — Sucralfate Tab 1 GM: ORAL | 30 days supply | Qty: 60 | Fill #0 | Status: AC

## 2019-10-09 ENCOUNTER — Other Ambulatory Visit: Payer: Self-pay

## 2019-10-10 ENCOUNTER — Other Ambulatory Visit: Payer: Self-pay

## 2019-10-11 ENCOUNTER — Other Ambulatory Visit: Payer: Self-pay

## 2019-10-11 MED ORDER — QVAR REDIHALER 40 MCG/ACT IN AERB
INHALATION_SPRAY | Freq: Two times a day (BID) | RESPIRATORY_TRACT | 8 refills | Status: AC
Start: 2019-10-10 — End: ?
  Filled 2019-10-11: qty 10.6, 60d supply, fill #0
  Filled 2019-11-24: qty 10.6, 30d supply, fill #1
  Filled 2020-01-06 – 2020-02-07 (×2): qty 10.6, 30d supply, fill #2
  Filled 2020-03-08: qty 10.6, 30d supply, fill #3
  Filled 2020-04-06: qty 10.6, 60d supply, fill #3

## 2019-10-13 ENCOUNTER — Other Ambulatory Visit
Admission: RE | Admit: 2019-10-13 | Discharge: 2019-10-13 | Disposition: A | Discharge status: Home / Self Care | Payer: Medicaid (Managed Care) | Source: Ambulatory Visit | Attending: Pathology | Admitting: Pathology

## 2019-10-13 DIAGNOSIS — N938 Other specified abnormal uterine and vaginal bleeding: Secondary | ICD-10-CM | POA: Insufficient documentation

## 2019-10-18 ENCOUNTER — Telehealth: Payer: Self-pay

## 2019-10-18 ENCOUNTER — Ambulatory Visit: Payer: Medicaid (Managed Care) | Admitting: Dermatology

## 2019-10-18 NOTE — Telephone Encounter (Unsigned)
Copied from [place] [id number]. Topic: Appointments - Reschedule Appointment  >> Oct 18, 2019  9:11 AM Stacie Templin, Ester Rink wrote:  Ms. Granderson is calling to cancel her appointment which is currently scheduled for 12/28 with NP Lauren     Reason for the cancellation: possible exposure to COVID, she's getting a COVID test today.     Has the appointment been cancelled?  Yes     Has the appointment been rescheduled? No     Does the patient need a call back to reschedule? Yes     Patient can be contacted back at [phone]

## 2019-10-19 LAB — SURGICAL PATHOLOGY

## 2019-10-20 ENCOUNTER — Encounter: Payer: Self-pay | Admitting: Dermatology

## 2019-10-20 ENCOUNTER — Other Ambulatory Visit: Payer: Self-pay

## 2019-10-20 ENCOUNTER — Ambulatory Visit: Payer: Medicaid (Managed Care) | Admitting: Dermatology

## 2019-10-20 VITALS — Temp 98.0°F | Ht 68.0 in | Wt 180.0 lb

## 2019-10-20 DIAGNOSIS — L700 Acne vulgaris: Secondary | ICD-10-CM

## 2019-10-20 DIAGNOSIS — D649 Anemia, unspecified: Secondary | ICD-10-CM

## 2019-10-20 DIAGNOSIS — N920 Excessive and frequent menstruation with regular cycle: Secondary | ICD-10-CM

## 2019-10-20 MED ORDER — TRETINOIN 0.05 % EX CREA *I*
TOPICAL_CREAM | CUTANEOUS | 2 refills | Status: DC
Start: 2019-10-20 — End: 2020-01-20
  Filled 2019-10-20: qty 45, 30d supply, fill #0
  Filled 2019-12-21: qty 45, 30d supply, fill #1

## 2019-10-20 MED ORDER — CLINDAMYCIN PHOSPHATE 1 % EX LOTN *I*
TOPICAL_LOTION | CUTANEOUS | 3 refills | Status: DC
Start: 2019-10-20 — End: 2020-01-20
  Filled 2019-10-20: qty 60, 30d supply, fill #0
  Filled 2019-12-21: qty 60, 30d supply, fill #1

## 2019-10-20 NOTE — Patient Instructions (Addendum)
Acne Vulgaris  -Education about acne provided  -Acne takes 6-8 weeks on a new treatment regimen to note improvement - you must be very compliant with the regimen below for 2 months before judging efficacy of the medications.  -Wash face with mild cleanser (Cetaphil, Cerave, or Neutrogena gentle cleanser) twice each day  -Start benzoyl peroxide 4% wash to face and shoulders every morning (do not use at night) NOTE - this can bleach clothes or towels if not washed off well and can also dry your skin out. IF your skin becomes itchy and dry, stop the wash for a few days and moisturize. You can purchase benzoyl peroxide wash over the counter at any drug store or grocery store. Examples include Panoxyl or Cerave Acne Foaming Cream Cleanser. Sulfa wash can be purchased through [email or web address], recommended brand is Sulfa lo.  -Start Clindamycin lotion 1% daily every morning. Discussed the side effects of clindamycin lotion which include but are not limited to transient drying, followed by erythema, burning, peeling, and itching.    -discussed common side effects of which include but are not limited to dry skin, skin redness, burning, and stinging. If these occur decrease frequency of use and apply moisturizer. Warning- may bleach clothing, sheets, or towels.  -Wash with Dove and night and then Start tretinoin 0.05% cream 3x weekly before bed to a clean dry face. Use the 5-dot method. Take a small pea-sized amount and place dots in each of 5 locations of your face: mid-forehead, each cheek, nose, and chin. Then rub in. You should not see a film of the medication on your skin; if you do, you're probably using too much. Avoid applying around the eyes or in the creases of the nose. Side effects reviewed - dryness. IF dryness occurs, use this cream every other night and moisturize. This medication should not be used by pregnant women.  -Moisturize with noncomedogenic lotion if needed for dryness. Look for the labelling on the  moisturizer to say "won't clog pores" or "oil-free" or "non-comedogenic"   -Wear non-comedogenic sunscreen when outside. These medications increase your sensitivity to the sun and risk for burning.  -Limit high glycemic foods and dairy, especially skim milk, whey protein, and sugary beverages. Plant-based milks can serve as good dairy substitutes.  WHAT IS ACNE AND WHY DO I HAVE PIMPLES?    The medical term for pimples is acne. Most people get at least some acne, especially during their teenage years. Why you get acne is complicated. One common belief is that acne comes from being dirty. This is not true; rather, acne is the result of changes that occur during puberty.    Your skin is made of layers. To keep the skin from getting dry, the skin makes oil in little wells called sebaceous glands that are found in the deeper layers of the skin. Whiteheads or blackheads are clogged sebaceous glands. Blackheads are not caused by dirt blocking the pores, but rather by oxidation (a chemical reaction that occurs when the oil reacts with oxygen in the air). People with acne have glands that make more oil and are more easily plugged, causing the glands to swell. Hormones, bacteria (called P. acnes) and your family's likelihood to have acne (genetic susceptibility) also play a role.    Skin Hygiene  Washing your face is part of taking good care of your skin. Good skin care habits are important and support the medications your doctor prescribes for your acne.    Wash your face twice  a day, once in the morning and once in the evening (which includes any showers you take).    Avoid over-washing/ over-scrubbing your face as this will not improve the acne and may lead to dryness and irritation, which can interfere with your medications.    In general, milder soaps and cleansers are better for acne-prone skin. The soaps labeled for sensitive skin are milder than those labeled deodorant soap.     Acne washes may  contain salicylic acid. Salicylic acid fights oil and bacteria mildly but can be drying and can add to irritation, so hold off using it unless recommended by your doctor. Scrubbing with a washcloth or loofah is also not advised as this can irritate and inflame your acne.    If you use makeup or sunscreen make sure that these products are labeled won't clog pores or won't cause acne or non-comedogenic, which means it will not cause or worsen acne.   Try not to pop pimples or pick at your acne, as this can delay healing and may lead to scarring or leave dark spots behind. Picking/popping acne can also cause a serious infection.   Wash or change your pillow case 1-2 times per week, especially if you use hair products.   If you play sports, try to wash right away when you are done. Also, pay attention to how your sports equipment (shoulder pads, helmet strap, etc.) might rub against your skin and be making your acne worse!    Acne Medications  If you have acne and the over the counter products are not working, you may need a prescription medication to help. Your doctor will tell you if you are one of those people. The good news is that acne treatments work really well when used properly.    WHAT CAN I DO TO HELP THE ACNE GO AWAY?    Some lifestyle changes can be beneficial in helping acne as well. Stress is known to aggravate acne, so try to get enough sleep and daily exercise. It is also important to eat a balanced diet. Some people feel that certain foods (like pizza, soda or chocolate) worsen their acne. While there aren't many studies available on this question, strict dietary changes are unlikely to be helpful and may be harmful to your health. If you find that a certain food seems to aggravate your acne, you may consider avoiding that food.  HOW SHOULD I USE MY ACNE MEDICATIONS?    Acne is a common condition that may vary in severity. A number of topical and/or oral medications can be used for its  treatment. Two to three months of consistent daily treatment is often needed to see maximal effect from a treatment regimen. That is how long it takes the skin layers to shed fully and recycle or grow out. Remember that acne medications are supposed to prevent acne, and the goal is maintaining clear skin. Talk to your doctor if you are not using your acne medications as you had originally discussed. Let them know any problems you are having. Common reasons for people to not use their medications include the following:   I used the medication prescribed by my doctor before and it did not work then; why should I use it again now?   The medication I was prescribed cost too much!   I did not like the way the medication felt on my skin. For example, it left my skin too dry or too greasy!   The medication was  too hard to use!   I can't remember to do it!   The medication had side effects that I did not like!   The acne plan was too complicated; I need something simpler to do!    TIPS FOR USING YOUR ACNE MEDICATIONS CORRECTLY     Apply your medication to clean, dry skin.     Apply the medicine to the entire area of your face that gets acne. The medications work by preventing new breakouts. Spot treatment of individual pimples does not do much.    Sometimes it is the combination of medicines that helps make the acne go away, not any single medication. Just because one medication may not have worked before does not mean it won't work when used in combination with another.    The medications are not vanishing creams (they are not magic!) - they take weeks to months to work. Be patient and use your medicine on a daily basis or as directed for six weeks before you ask whether your skin looks better. Try not to miss more than one or two days each week.   Don't stop putting on the medicine just because the acne is better. Remember that the acne is better because of the medication, and prevention is the  key.    PREGNANCY AND ACNE TREATMENT    If you are pregnant, planning pregnancy or breastfeeding, please discuss with your doctor as your acne medication regimen may need to be altered.      Contributing SPD members: Emelda Brothers, MD; Tillman Sers, MD; Burnetta Sabin, MD; Glenetta Hew, MD; Emilio Aspen, MD  Committee Reviewers: Theodosia Blender, MD; Phillip Heal, MD  Expert Reviewer: Wallene Dales, MD

## 2019-10-20 NOTE — Telephone Encounter (Signed)
Tested negative. Rescheduled.

## 2019-10-20 NOTE — Progress Notes (Signed)
Referring Provider: No ref. provider found   PCP: Glean Salvo, MD     Chief complaint: New Patient Visit (acne ,face, arms and back)    Derm History: acne    HPI:   [name] is a pleasant 43 y.o. female here for the concern noted above.    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   Smoking History:  reports that she has been smoking. She has been smoking about 0.50 packs per day. She has never used smokeless tobacco.    ROS:   Integumentary ROS: positive for changing skin lesions  Constitutional: Denies fevers, chills or weight loss  + fatigue  GI/GU: + vaginal bleeding, recently had Biopsy, waiting on IUD. Has had her period for the last month     Physical Exam:    Vitals:    10/20/19 1401   Temp: 36.7 C (98 F)   Weight: 81.6 kg (180 lb)   Height: 1.727 m (5\' 8" )      Pain    10/20/19 1401   PainSc:   0 - No pain      General: Appears well, NAD  Orientation: Alert and oriented x3  Mood/Affect: Normal affect  Skin: All of the following were examined, and were within normal limits, except as noted: Face, Ears, Scalp/Hair, Eyes/Eyelids, Lips, Neck and Digits/Nails  The face appears oily and shiny  Open comedones on the nose  Several closed comedones, violaceous erythematous papules, and post inflammatory brown macules on the forehead and right malar cheek       Ref. Range 08/02/2019 15:43   TSH Latest Ref Range: 0.27 - 4.20 uIU/mL 1.61   Free T4 Latest Ref Range: 0.9 - 1.7 ng/dL 1.0   Prolactin Latest Units: ng/mL 15.1   WBC Latest Ref Range: 4.0 - 10.0  THOU/uL 8.6   RBC Latest Ref Range: 3.9 - 5.2 MIL/uL 3.8 (L)   Hemoglobin Latest Ref Range: 11.2 - 15.7 g/dL 8.6 (L)   Hematocrit Latest Ref Range: 34 - 45 % 29 (L)   MCV Latest Ref Range: 79 - 95 fL 75 (L)   MCH Latest Ref Range: 26 - 32 pg/cell 23 (L)   MCHC Latest Ref Range: 32 - 36 g/dL 30 (L)   RDW Latest Ref Range: 11.7 - 14.4 % 23.2 (H)   Platelets Latest Ref Range: 160 - 370 THOU/uL 420 (H)     Assessment/Plan:   Acne Vulgaris, mild mixed inflammatory and comedonal  -Education about acne provided  -Acne takes 6-8 weeks on a new treatment regimen to note improvement - you must be very compliant with the regimen below for 2 months before judging efficacy of the medications.  -Wash face with mild cleanser (Cetaphil, Cerave, or Neutrogena gentle cleanser) twice each day  -Start benzoyl peroxide 4% wash to face and shoulders every morning (do not use at night) NOTE - this can bleach clothes or towels if not washed off well and can  also dry your skin out. IF your skin becomes itchy and dry, stop the wash for a few days and moisturize. You can purchase benzoyl peroxide wash over the counter at any drug store or grocery store. Examples include Panoxyl or Cerave Acne Foaming Cream Cleanser.   -Start Clindamycin lotion 1% daily every morning. Discussed the side effects of clindamycin lotion which include but are not limited to transient drying, followed by erythema, burning, peeling, and itching.    -discussed common side effects of which include but are not limited to dry skin, skin redness, burning, and stinging. If these occur decrease frequency of use and apply moisturizer. Warning- may bleach clothing, sheets, or towels.  -Wash with Dove at night and then Start tretinoin 0.05% cream 3x weekly before bed to a clean dry face. Use the 5-dot method. Take a small pea-sized amount and place dots in each of 5 locations of your face: mid-forehead, each cheek, nose, and chin. Then rub in. You should not see a film of  the medication on your skin; if you do, you're probably using too much. Avoid applying around the eyes or in the creases of the nose. Side effects reviewed - dryness. IF dryness occurs, use this cream every other night and moisturize. This medication should not be used by pregnant women.  -Moisturize with noncomedogenic lotion if needed for dryness. Look for the labelling on the moisturizer to say "won't clog pores" or "oil-free" or "non-comedogenic"   -Wear non-comedogenic sunscreen when outside. These medications increase your sensitivity to the sun and risk for burning.  -Limit high glycemic foods and dairy, especially skim milk, whey protein, and sugary beverages. Plant-based milks can serve as good dairy substitutes.    Menorrhagia and Anemia  -Advised patient follow-up with OBGYN. Hair and nail growth should improve once bleeding is under better control and anemia improves. Can re-visit complaint at future visit.    Barriers to learning: None    Return to Clinic: 34m    Terressa Koyanagi MSN, FNP-BC  Dermatology Fellow

## 2019-10-28 ENCOUNTER — Other Ambulatory Visit: Payer: Self-pay

## 2019-10-29 ENCOUNTER — Other Ambulatory Visit: Payer: Self-pay

## 2019-10-29 MED ORDER — OPTICHAMBER DIAMOND MISC
0 refills | Status: AC
Start: 2019-10-29 — End: ?
  Filled 2019-10-29 – 2019-12-08 (×2): qty 1, 30d supply, fill #0

## 2019-10-29 MED ORDER — ALBUTEROL SULFATE HFA 108 (90 BASE) MCG/ACT IN AERS *I*
2.0000 | INHALATION_SPRAY | Freq: Four times a day (QID) | RESPIRATORY_TRACT | 5 refills | Status: AC | PRN
Start: 2019-10-29 — End: ?
  Filled 2019-10-29: qty 18, 25d supply, fill #0
  Filled 2019-11-24: qty 18, 25d supply, fill #1
  Filled 2019-12-21: qty 18, 25d supply, fill #2
  Filled 2020-02-07: qty 18, 25d supply, fill #3
  Filled 2020-03-08: qty 18, 25d supply, fill #4
  Filled 2020-04-06: qty 18, 25d supply, fill #5

## 2019-11-02 ENCOUNTER — Other Ambulatory Visit: Payer: Self-pay

## 2019-11-03 ENCOUNTER — Other Ambulatory Visit: Payer: Self-pay

## 2019-11-03 ENCOUNTER — Other Ambulatory Visit: Payer: Self-pay | Admitting: Gastroenterology

## 2019-11-03 ENCOUNTER — Other Ambulatory Visit: Payer: Self-pay | Admitting: Primary Care

## 2019-11-03 DIAGNOSIS — I10 Essential (primary) hypertension: Secondary | ICD-10-CM

## 2019-11-04 ENCOUNTER — Other Ambulatory Visit: Payer: Self-pay

## 2019-11-04 MED ORDER — CLONIDINE HCL 0.1 MG PO TABS *I*
ORAL_TABLET | ORAL | 2 refills | Status: AC
Start: 2019-11-03 — End: ?
  Filled 2019-11-04: qty 120, 30d supply, fill #0
  Filled 2019-12-06: qty 120, 30d supply, fill #1
  Filled 2020-02-07: qty 120, 30d supply, fill #2

## 2019-11-05 ENCOUNTER — Other Ambulatory Visit: Payer: Self-pay

## 2019-11-05 MED ORDER — PREGABALIN 150 MG PO CAPS *I*
150.0000 mg | ORAL_CAPSULE | Freq: Two times a day (BID) | ORAL | 2 refills | Status: DC
Start: 2019-11-04 — End: 2020-02-07
  Filled 2019-11-05: qty 60, 30d supply, fill #0
  Filled 2019-12-06 (×2): qty 60, 30d supply, fill #1
  Filled 2020-01-07: qty 60, 30d supply, fill #2

## 2019-11-05 MED ORDER — OXYCODONE-ACETAMINOPHEN 5-325 MG PO TABS *I*
1.0000 | ORAL_TABLET | Freq: Three times a day (TID) | ORAL | 0 refills | Status: DC
Start: 2019-11-04 — End: 2019-11-29
  Filled 2019-11-05: qty 84, 28d supply, fill #0
  Filled 2019-11-05: qty 6, 2d supply, fill #0

## 2019-11-05 MED ORDER — SUCRALFATE 1 GM PO TABS *I*
ORAL_TABLET | ORAL | 2 refills | Status: DC
Start: 2019-11-05 — End: 2020-02-07
  Filled 2019-11-05: qty 60, 30d supply, fill #0
  Filled 2019-12-06: qty 60, 30d supply, fill #1
  Filled 2020-01-06: qty 60, 30d supply, fill #2

## 2019-11-09 ENCOUNTER — Other Ambulatory Visit: Payer: Self-pay

## 2019-11-12 ENCOUNTER — Other Ambulatory Visit: Payer: Self-pay

## 2019-11-12 ENCOUNTER — Ambulatory Visit: Payer: Medicaid (Managed Care) | Admitting: Physical Medicine and Rehabilitation

## 2019-11-12 ENCOUNTER — Encounter: Payer: Self-pay | Admitting: Physical Medicine and Rehabilitation

## 2019-11-12 VITALS — BP 157/94 | HR 83 | Ht 68.0 in | Wt 190.0 lb

## 2019-11-12 DIAGNOSIS — M48062 Spinal stenosis, lumbar region with neurogenic claudication: Secondary | ICD-10-CM

## 2019-11-12 DIAGNOSIS — M5417 Radiculopathy, lumbosacral region: Secondary | ICD-10-CM

## 2019-11-12 MED ORDER — LIDOCAINE 5 % EX PTCH *I*
MEDICATED_PATCH | CUTANEOUS | 2 refills | Status: AC
Start: 2019-11-12 — End: ?
  Filled 2019-11-12: qty 30, 30d supply, fill #0

## 2019-11-12 NOTE — Progress Notes (Signed)
Chief complaint: Follow-up low back right leg pain    HPI: Patient returns to the office with a complaint of worsening recurring right-sided low back pain with radiation down the right lower extremity.    Symptoms are aggravated with walking and standing and mitigated with sitting.    Patient underwent a therapeutic right L4 and L5 transforaminal selective nerve root block with 80% reduction in her pain.  She has done well with these injections in the past.  Patient reports improvement in her walking and standing endurance following these injections.  Patient may have aggravated her back pain mopping.    Patient rates her pain as a 6-1/10.    Current treatment: Oxycodone/apap, Lyrica, Lidoderm patches (she borrowed from her friend and would like a prescription)    Patient denies  Progressive lower extremity weakness, constitutional symptoms or bowel or bladder dysfunction.    Physical exam:    Vitals:    11/12/19 0906   BP: (!) 157/94   Pulse: 83   Weight: 86.2 kg (190 lb)   Height: 1.727 m (5\' 8" )     Patient walks stiffly with a slightly flexed posture.  Patient demonstrates heel walk, toe walk, but with complaints of pain.  There is tenderness to palpation of the right lumbosacral paraspinal musculature. Sitting straight leg raise is negative for causing  lower extremity leg pain. Strength is intact 5 out of 5 in lower extremities across all lumbar myotomes.  Lumbar extension is significantly limited secondary to pain.    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    Patient requested a prescription for lidocaine 5% patches.  She may apply 1-3 patches to the most painful aspect of the right side of her back up to 12 hours a day as needed for pain.   Patient understands that her insurance may not cover the patches. She can purchase the 4% patches over-the-counter.    Patient wonders if we should obtain an updated MRI of the lumbar spine.  Given that she states her symptoms are similar to those for which she had undergone these epidural steroid injections in the past with good results there is no need to repeat imaging at this time.  If she does undergo an injection and does not achieve good relief as she has in the past then I would obtain an updated MRI.    We will seek authorization for therapeutic right L4 and L5 transforaminal selective nerve root block under fluoroscopic guidance.    Patient will return post injection at the direction of Dr. .    Please feel free to contact me with any questions or concerns that you may have.    Hermenia Bers, Duffy Rhody

## 2019-11-15 ENCOUNTER — Other Ambulatory Visit: Payer: Self-pay

## 2019-11-16 ENCOUNTER — Ambulatory Visit
Admission: RE | Admit: 2019-11-16 | Discharge: 2019-11-16 | Disposition: A | Discharge status: Home / Self Care | Payer: Medicaid (Managed Care) | Source: Ambulatory Visit | Attending: Physical Medicine and Rehabilitation | Admitting: Physical Medicine and Rehabilitation

## 2019-11-16 ENCOUNTER — Other Ambulatory Visit: Payer: Self-pay | Admitting: Physical Medicine and Rehabilitation

## 2019-11-16 ENCOUNTER — Ambulatory Visit: Payer: Medicaid (Managed Care) | Admitting: Physical Medicine and Rehabilitation

## 2019-11-16 ENCOUNTER — Encounter: Payer: Self-pay | Admitting: Physical Medicine and Rehabilitation

## 2019-11-16 VITALS — BP 156/99 | HR 71 | Temp 97.8°F | Resp 16 | Ht 68.0 in | Wt 180.0 lb

## 2019-11-16 DIAGNOSIS — M5416 Radiculopathy, lumbar region: Secondary | ICD-10-CM | POA: Insufficient documentation

## 2019-11-16 DIAGNOSIS — M545 Low back pain, unspecified: Secondary | ICD-10-CM

## 2019-11-16 MED ORDER — IOHEXOL 240 MG/ML (OMNIPAQUE) IV SOLN *I*
2.0000 mL | Freq: Once | INTRAMUSCULAR | Status: AC | PRN
Start: 2019-11-16 — End: 2019-11-16
  Administered 2019-11-16: 2 mL

## 2019-11-16 MED ORDER — BETAMETHASONE ACET & SOD PHOS 6 (3-3) MG/ML IJ SUSP *I*
18.0000 mg | Freq: Once | INTRAMUSCULAR | Status: AC | PRN
Start: 2019-11-16 — End: 2019-11-16
  Administered 2019-11-16: 18 mg

## 2019-11-16 MED ORDER — LIDOCAINE HCL 1 % IJ SOLN *I*
5.0000 mL | Freq: Once | INTRAMUSCULAR | Status: AC | PRN
Start: 2019-11-16 — End: 2019-11-16
  Administered 2019-11-16: 5 mL via SUBCUTANEOUS

## 2019-11-16 MED ORDER — LIDOCAINE HCL 1% IJ SOLN (PF) (AMB) *I*
2.0000 mL | Freq: Once | INTRAMUSCULAR | Status: AC | PRN
Start: 2019-11-16 — End: 2019-11-16
  Administered 2019-11-16: 2 mL

## 2019-11-16 NOTE — Progress Notes (Signed)
11-16-19 LM@4 :29 SCH REPEAT RIGHT TH L4+L5 TFSNRB/PATEL/NAR/NB

## 2019-11-16 NOTE — Patient Instructions (Addendum)
Post Spinal Injection/Procedure Instructions    Instructions:  Notify the office of the doctor who performed your procedure if you experience any of the following:  · Fever of 100 degrees Fahrenheit or 38 degrees Celsius or greater.  · Excessive swelling or redness at the injection site.  · Excessive pain or numbness that lasts longer than 72 hours after your injections.  · Severe headache that goes away when you lie flat and comes back when you sit upright    Activity:  · Rest after your injection for 24 hours, then return to your usual activities.  This includes any restrictions your physician may have prescribed.  · Do not drive the day of your injection.    Care of the injection site:  · You may ice the injection site for local discomfort.  Cover ice with a cloth and apply light pressure.  Never place ice directly on the skin.   Apply ice for 15 minutes on then off for 1 hour.  Repeat as needed.  · Take band aids off the following day.    For diabetic patients:  · Check your blood sugar level every 8 hours for the next 72 hours.  If it is greater than 250, call your primary care physician.    Medications:  · You may continue taking current medications.  · Remember, the steroid you had injected today usually takes 1 to 3 days to take effect, however it can take up to 7 days.    Contacts/Questions:  In case of an urgent issue or questions:  · Dr. Patel's secretary can be reached at [phone]  · Dr. Everett's secretary can be reached at [phone]  · Dr. Laplante's secretary can be reached at [phone]  · Dr. Speach's secretary can be reached at [phone]  · Dr. Orsini's secretary can be reached at [phone]  · If it is after 4:30 PM, a weekend or a holiday, call the answering service at [phone] and ask for the physician on call.    Follow up injection:  Please call the Prior Auth team at [phone] or [phone] in 2 weeks to answer questions that are required by your  insurance company in order to get another injection approved by your insurance.  Once we obtain the authorization, our schedulers will call to schedule the injection.    If you have not heard within a week from the office after you have answered the questions after your first injection, please call the above number to check on this.    Please refrain from calling the secretaries as the Prior Auth Team is managing the requests for your insurance company.  You can either call the Prior Auth Team or your insurance company.      If you have a scheduled injection and you feel 90% better the day before or the day of the injection, please call [phone] to cancel the injection.

## 2019-11-16 NOTE — Procedures (Signed)
Date/Time: 11/16/2019  3:05 PM EST  Epidural Steroid Injection(s): Transforaminal - lumbar or sacral 1st level - CPT 64483 and Transforaminal - lumbar or sacral 2nd additional level - CPT 64484  Laterality: Right  Contrast - Right: 2 mL iohexol 240 MG/ML  Anesthetic medications - Right: 5 mL lidocaine hcl 1 %; 2 mL Lidocaine HCl 1 %  Steroid medication - Right: 18 mg betamethasone acetate & sodium phosphate 6 (3-3) MG/ML    PROCEDURES PERFORMED:  1. Therapeutic right L4 and L5 transforaminal selective nerve root block.    REASON FOR PROCEDURES: Lumbar radicular leg pain.    PROCEDURE:  After obtaining informed consent, the patient was placed in the prone position on the fluoroscopy table. Cycled one minute blood pressure, pulse and pulse oximetry was maintained. The lumbosacral skin was prepped and draped in a sterile fashion with Chloraprep and sterile towels. Conscious sedation was not administered.     After raising a skin wheal with 1% Xylocaine and anesthetizing the subcutaneous tissues, a #22 gauge 3.5 inch spinal needle was placed under fluoroscopic control to a point inferior and lateral to the L4 and L5 pedicles. Approximately 2 mL Omnipaque M-240 contrast dye was injected, demonstrating perineural and anterior epidural spread without vascular uptake in the AP and oblique views. There was opacification of the L4 and L5 nerve root sheaths with epidural reflux of contrast material extending into the anterior epidural space at the L3-L4 and L4-L5 segmental levels. A total of 2 mL of 1% preservative free Lidocaine and 3 mL of 6 mg per mL of preservative free Celestone was instilled around the L4 and L5 nerve roots.    The patient tolerated the procedure well and was transitioned to the post procedure holding area uneventfully. The patient was monitored for adverse allergic, paralytic and hypertensive reactions. The patient was discharged without complication.    EBL: Less than 43mL    Specimens removed:  None.

## 2019-11-16 NOTE — Progress Notes (Signed)
Pt arrived ambulatory. Has a driver. Denies pregnancy. Denies use of antibiotics and blood thinners.

## 2019-11-18 ENCOUNTER — Other Ambulatory Visit: Payer: Self-pay

## 2019-11-24 ENCOUNTER — Other Ambulatory Visit: Payer: Self-pay

## 2019-11-25 ENCOUNTER — Other Ambulatory Visit: Payer: Self-pay

## 2019-11-25 MED ORDER — DIPHENHYDRAMINE HCL 25 MG ORAL SOLID *WRAPPED*
25.0000 mg | Freq: Three times a day (TID) | ORAL | 3 refills | Status: DC
Start: 2019-11-24 — End: 2020-03-06
  Filled 2019-11-25: qty 90, 30d supply, fill #0
  Filled 2020-02-07: qty 90, 30d supply, fill #1

## 2019-11-26 LAB — UNMAPPED LAB RESULTS
ABO RH Blood Type (HT): O POS — NL
Antibody Screen (HT): NEGATIVE — NL
Hematocrit (HT): 25 % — ABNORMAL LOW (ref 34–47)
Hemoglobin (HGB) (HT): 7.5 g/dL — ABNORMAL LOW (ref 11.5–16.0)
MCHC (HT): 29.5 g/dL — ABNORMAL LOW (ref 32.0–36.0)
MCV (HT): 74.7 fL — ABNORMAL LOW (ref 81.0–99.0)
Mean Corpuscular Hemoglobin (MCH) (HT): 22.1 pg — ABNORMAL LOW (ref 26.0–34.0)
Platelets (HT): 507 10 3/uL — ABNORMAL HIGH (ref 140–400)
RBC (HT): 3.4 10 6/uL — ABNORMAL LOW (ref 3.80–5.20)
RDW (HT): 20.7 % — ABNORMAL HIGH (ref 11.5–15.0)
WBC (HT): 10.3 10 3/uL — NL (ref 4.0–10.8)

## 2019-11-29 ENCOUNTER — Other Ambulatory Visit: Payer: Self-pay

## 2019-11-29 MED ORDER — OXYCODONE-ACETAMINOPHEN 5-325 MG PO TABS *I*
ORAL_TABLET | ORAL | 0 refills | Status: DC
Start: 2019-11-29 — End: 2019-12-06
  Filled 2019-11-29: qty 20, 5d supply, fill #0

## 2019-11-29 MED ORDER — IBUPROFEN 600 MG PO TABS *I*
ORAL_TABLET | ORAL | 2 refills | Status: AC
Start: 2019-11-29 — End: ?
  Filled 2019-11-29: qty 60, 15d supply, fill #0
  Filled 2019-12-21: qty 60, 15d supply, fill #1
  Filled 2020-01-06: qty 60, 15d supply, fill #2

## 2019-11-30 ENCOUNTER — Ambulatory Visit: Payer: Medicaid (Managed Care) | Admitting: Physical Medicine and Rehabilitation

## 2019-11-30 ENCOUNTER — Other Ambulatory Visit: Payer: Self-pay

## 2019-12-03 ENCOUNTER — Other Ambulatory Visit: Payer: Self-pay

## 2019-12-03 ENCOUNTER — Ambulatory Visit
Admission: RE | Admit: 2019-12-03 | Discharge: 2019-12-03 | Disposition: A | Discharge status: Home / Self Care | Payer: Medicaid (Managed Care) | Source: Ambulatory Visit | Admitting: Radiology

## 2019-12-03 ENCOUNTER — Ambulatory Visit
Payer: Medicaid (Managed Care) | Attending: Physical Medicine and Rehabilitation | Admitting: Physical Medicine and Rehabilitation

## 2019-12-03 ENCOUNTER — Other Ambulatory Visit: Payer: Self-pay | Admitting: Physical Medicine and Rehabilitation

## 2019-12-03 VITALS — BP 178/99 | HR 79 | Temp 99.3°F | Resp 18 | Ht 68.0 in | Wt 180.0 lb

## 2019-12-03 DIAGNOSIS — M545 Low back pain, unspecified: Secondary | ICD-10-CM

## 2019-12-03 DIAGNOSIS — M5416 Radiculopathy, lumbar region: Secondary | ICD-10-CM | POA: Insufficient documentation

## 2019-12-03 MED ORDER — BETAMETHASONE ACET & SOD PHOS 6 (3-3) MG/ML IJ SUSP *I*
18.0000 mg | Freq: Once | INTRAMUSCULAR | Status: AC | PRN
Start: 2019-12-03 — End: 2019-12-03
  Administered 2019-12-03: 18 mg

## 2019-12-03 MED ORDER — LIDOCAINE HCL 1% IJ SOLN (PF) (AMB) *I*
2.0000 mL | Freq: Once | INTRAMUSCULAR | Status: AC | PRN
Start: 2019-12-03 — End: 2019-12-03
  Administered 2019-12-03: 2 mL

## 2019-12-03 MED ORDER — IOHEXOL 240 MG/ML (OMNIPAQUE) IV SOLN *I*
2.0000 mL | Freq: Once | INTRAMUSCULAR | Status: AC | PRN
Start: 2019-12-03 — End: 2019-12-03
  Administered 2019-12-03: 2 mL

## 2019-12-03 MED ORDER — LIDOCAINE HCL 1 % IJ SOLN *I*
5.0000 mL | Freq: Once | INTRAMUSCULAR | Status: AC | PRN
Start: 2019-12-03 — End: 2019-12-03
  Administered 2019-12-03: 5 mL via SUBCUTANEOUS

## 2019-12-03 NOTE — Progress Notes (Signed)
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 Had IUD placed along with exploration under anesthesia Monday.

## 2019-12-03 NOTE — Patient Instructions (Signed)
Post Spinal Injection / Procedure Instructions    Instructions:  Notify the office of the doctor who performed your procedure if you experience any of the following:  · Fever of 100 degrees Fahrenheit or 38 degrees Celsius or greater.  · Excessive swelling or redness at the injection site.  · Excessive pain or numbness that lasts longer than 72 hours after your injections.  · Severe headache that goes away when you lie flat and comes back when you sit upright    Activity:  · Rest after your injection for 24 hours, then return to your usual activities.  This includes any restrictions your physician may have prescribed.  · Do not drive the day of your injection.    Care of the injection site:  · You may ice the injection site for local discomfort.  Cover ice with a cloth and apply light pressure.  Never place ice directly on the skin.   Apply ice for 15 minutes on then off for 1 hour.  Repeat as needed.  · Take band aids off the following day.    For diabetic patients:  · Check your blood sugar level every 8 hours for the next 72 hours.  If it is greater than 250, call your primary care physician.    Medications:  · You may continue taking your pre-injection medications as previously prescribed.  · Remember, the steroid you had injected today usually takes 1 to 3 days to take effect, however it can take up to 7 days.    Contacts/Questions:  In case of an urgent issue or questions:  · Dr. Patel's secretary can be reached at [phone]  · Dr. Everett's secretary can be reached at [phone]  · Dr. Laplante's secretary can be reached at [phone]  · Dr. Speach's secretary can be reached at [phone]  · Dr. Orsini's secretary can be reached at [phone]  · If it is after 4:30 PM, a weekend or a holiday, call the answering service at [phone] and ask for the physician on call.    Follow-up Office Visit:   You need to call the appointment center at [phone] for a follow-up office visit  with [name] in 6-12 weeks or the next available appointment.

## 2019-12-03 NOTE — Procedures (Signed)
Date/Time: 12/03/2019  1:00 PM EST  Epidural Steroid Injection(s): Transforaminal - lumbar or sacral 1st level - CPT [phone] and Transforaminal - lumbar or sacral 2nd additional level - CPT 64484  Laterality: Right  Contrast - Right: 2 mL iohexol 240 MG/ML  Anesthetic medications - Right: 5 mL lidocaine hcl 1 %; 2 mL Lidocaine HCl 1 %  Steroid medication - Right: 18 mg betamethasone acetate & sodium phosphate 6 (3-3) MG/ML    PROCEDURES PERFORMED:  1. Therapeutic right L4 and L5 transforaminal selective nerve root block.    REASON FOR PROCEDURES: Lumbar radicular leg pain.    PROCEDURE:  After obtaining informed consent, the patient was placed in the prone position on the fluoroscopy table. Cycled one minute blood pressure, pulse and pulse oximetry was maintained. The lumbosacral skin was prepped and draped in a sterile fashion with Chloraprep and sterile towels. Conscious sedation was not administered.     After raising a skin wheal with 1% Xylocaine and anesthetizing the subcutaneous tissues, a #22 gauge 3.5 inch spinal needle was placed under fluoroscopic control to a point inferior and lateral to the L4 and L5 pedicles. Approximately 2 mL Omnipaque M-240 contrast dye was injected, demonstrating perineural and anterior epidural spread without vascular uptake in the AP and oblique views. There was opacification of the L4 and L5 nerve root sheaths with epidural reflux of contrast material extending into the anterior epidural space at the L3-L4 and L4-L5 segmental levels. A total of 2 mL of 1% preservative free Lidocaine and 3 mL of 6 mg per mL of preservative free Celestone was instilled around the L4 and L5 nerve roots.    The patient tolerated the procedure well and was transitioned to the post procedure holding area uneventfully. The patient was monitored for adverse allergic, paralytic and hypertensive reactions. The patient was discharged without complication.    EBL: Less than 34mL    Specimens removed:  None.

## 2019-12-04 ENCOUNTER — Other Ambulatory Visit: Payer: Self-pay

## 2019-12-06 ENCOUNTER — Other Ambulatory Visit: Payer: Self-pay

## 2019-12-06 MED ORDER — OXYCODONE-ACETAMINOPHEN 5-325 MG PO TABS *I*
1.0000 | ORAL_TABLET | Freq: Three times a day (TID) | ORAL | 0 refills | Status: DC
Start: 2019-12-06 — End: 2020-01-04
  Filled 2019-12-06: qty 90, 30d supply, fill #0

## 2019-12-07 ENCOUNTER — Other Ambulatory Visit: Payer: Self-pay

## 2019-12-08 ENCOUNTER — Other Ambulatory Visit: Payer: Self-pay

## 2019-12-09 ENCOUNTER — Other Ambulatory Visit: Payer: Self-pay

## 2019-12-10 ENCOUNTER — Other Ambulatory Visit
Admission: RE | Admit: 2019-12-10 | Discharge: 2019-12-10 | Disposition: A | Discharge status: Home / Self Care | Payer: Medicaid (Managed Care) | Source: Ambulatory Visit | Attending: Internal Medicine | Admitting: Internal Medicine

## 2019-12-10 ENCOUNTER — Other Ambulatory Visit: Payer: Self-pay

## 2019-12-10 DIAGNOSIS — D500 Iron deficiency anemia secondary to blood loss (chronic): Secondary | ICD-10-CM | POA: Insufficient documentation

## 2019-12-10 DIAGNOSIS — I10 Essential (primary) hypertension: Secondary | ICD-10-CM | POA: Insufficient documentation

## 2019-12-10 LAB — BASIC METABOLIC PANEL
Anion Gap: 12 (ref 7–16)
CO2: 25 mmol/L (ref 20–28)
Calcium: 9.7 mg/dL (ref 8.8–10.2)
Chloride: 102 mmol/L (ref 96–108)
Creatinine: 0.92 mg/dL (ref 0.51–0.95)
GFR,Black: 87 *
GFR,Caucasian: 76 *
Glucose: 98 mg/dL (ref 60–99)
Lab: 15 mg/dL (ref 6–20)
Potassium: 4.3 mmol/L (ref 3.3–5.1)
Sodium: 139 mmol/L (ref 133–145)

## 2019-12-10 LAB — CBC
Hematocrit: 30 % — ABNORMAL LOW (ref 34–45)
Hemoglobin: 9.1 g/dL — ABNORMAL LOW (ref 11.2–15.7)
MCH: 22 pg/cell — ABNORMAL LOW (ref 26–32)
MCHC: 30 g/dL — ABNORMAL LOW (ref 32–36)
MCV: 73 fL — ABNORMAL LOW (ref 79–95)
Platelets: 491 10*3/uL — ABNORMAL HIGH (ref 160–370)
RBC: 4.2 MIL/uL (ref 3.9–5.2)
RDW: 23.9 % — ABNORMAL HIGH (ref 11.7–14.4)
WBC: 9.6 10*3/uL (ref 4.0–10.0)

## 2019-12-10 LAB — TIBC
Iron: 28 ug/dL — ABNORMAL LOW (ref 34–165)
TIBC: 582 ug/dL — ABNORMAL HIGH (ref 250–450)
Transferrin Saturation: 5 % — ABNORMAL LOW (ref 15–50)

## 2019-12-10 LAB — FERRITIN: Ferritin: 9 ng/mL — ABNORMAL LOW (ref 10–120)

## 2019-12-10 MED ORDER — AMLODIPINE BESYLATE 10 MG PO TABS *I*
10.0000 mg | ORAL_TABLET | Freq: Every day | ORAL | 0 refills | Status: DC
Start: 2019-12-10 — End: 2020-01-07
  Filled 2019-12-10: qty 30, 30d supply, fill #0

## 2019-12-10 MED ORDER — FERROUS SULFATE 324 (65 FE) MG PO TBEC
1.0000 | DELAYED_RELEASE_TABLET | ORAL | 5 refills | Status: DC
Start: 2019-12-10 — End: 2020-03-06
  Filled 2019-12-10: qty 15, 30d supply, fill #0
  Filled 2020-02-07: qty 15, 30d supply, fill #1

## 2019-12-15 ENCOUNTER — Other Ambulatory Visit: Payer: Self-pay

## 2019-12-16 ENCOUNTER — Other Ambulatory Visit: Payer: Self-pay

## 2019-12-21 ENCOUNTER — Other Ambulatory Visit: Payer: Self-pay

## 2019-12-22 ENCOUNTER — Other Ambulatory Visit: Payer: Self-pay

## 2019-12-23 ENCOUNTER — Other Ambulatory Visit: Payer: Self-pay

## 2019-12-28 ENCOUNTER — Other Ambulatory Visit: Payer: Self-pay

## 2019-12-30 ENCOUNTER — Other Ambulatory Visit: Payer: Self-pay

## 2019-12-30 MED ORDER — DOCUSATE SODIUM 100 MG PO CAPS *I*
100.0000 mg | ORAL_CAPSULE | Freq: Two times a day (BID) | ORAL | 8 refills | Status: DC | PRN
Start: 2019-12-29 — End: 2022-08-06
  Filled 2019-12-30: qty 60, 30d supply, fill #0
  Filled 2020-02-07: qty 60, 30d supply, fill #1
  Filled 2020-03-08: qty 60, 30d supply, fill #2
  Filled 2020-04-06: qty 60, 30d supply, fill #3

## 2019-12-30 MED ORDER — FERROUS SULFATE 325 (65 FE) MG PO TABS
325.0000 mg | ORAL_TABLET | Freq: Two times a day (BID) | ORAL | 8 refills | Status: DC
Start: 2019-12-29 — End: 2020-03-06
  Filled 2019-12-30: qty 60, 30d supply, fill #0

## 2020-01-03 ENCOUNTER — Other Ambulatory Visit: Payer: Self-pay

## 2020-01-04 ENCOUNTER — Other Ambulatory Visit: Payer: Self-pay

## 2020-01-04 MED ORDER — OXYCODONE-ACETAMINOPHEN 5-325 MG PO TABS *I*
1.0000 | ORAL_TABLET | Freq: Three times a day (TID) | ORAL | 0 refills | Status: DC
Start: 2020-01-03 — End: 2020-02-07
  Filled 2020-01-04: qty 90, 30d supply, fill #0

## 2020-01-06 ENCOUNTER — Other Ambulatory Visit: Payer: Self-pay

## 2020-01-07 ENCOUNTER — Other Ambulatory Visit: Payer: Self-pay

## 2020-01-07 MED ORDER — AMLODIPINE BESYLATE 10 MG PO TABS *I*
10.0000 mg | ORAL_TABLET | Freq: Every day | ORAL | 8 refills | Status: AC
Start: 2020-01-06 — End: ?
  Filled 2020-01-07: qty 30, 30d supply, fill #0
  Filled 2020-02-07: qty 30, 30d supply, fill #1
  Filled 2020-03-08: qty 30, 30d supply, fill #2
  Filled 2020-04-06: qty 30, 30d supply, fill #3

## 2020-01-10 ENCOUNTER — Ambulatory Visit: Payer: Self-pay | Attending: Internal Medicine

## 2020-01-10 DIAGNOSIS — Z23 Encounter for immunization: Secondary | ICD-10-CM

## 2020-01-10 NOTE — Progress Notes (Signed)
   Covid-19 Vaccination Clinic  Name:  [name]    MRN: [id number] DOB: April 02, 1976  01/10/2020  [name] was observed post Covid-19 immunization for 15 minutes without incident. She was provided with Vaccine Information Sheet and instruction to access the V-Safe system.   [name] was instructed to call 911 with any severe reactions post vaccine: Marland Kitchen Difficulty breathing  . Swelling of face and throat  . A fast heartbeat  . A bad rash all over body  . Dizziness and weakness   Immunizations Administered    Name Date Dose VIS Date Route   Pfizer COVID-19 Vaccine 01/10/2020 12:22 PM 0.3 mL 10/01/2019 Intramuscular   Manufacturer: [employer], [employer]   Lot: [id number]   NDC: [address]-5

## 2020-01-20 ENCOUNTER — Encounter: Payer: Self-pay | Admitting: Dermatology

## 2020-01-20 ENCOUNTER — Other Ambulatory Visit: Payer: Self-pay

## 2020-01-20 ENCOUNTER — Ambulatory Visit: Payer: Medicaid (Managed Care) | Admitting: Dermatology

## 2020-01-20 VITALS — BP 162/98 | Temp 97.0°F | Ht 68.0 in | Wt 185.0 lb

## 2020-01-20 DIAGNOSIS — L708 Other acne: Secondary | ICD-10-CM

## 2020-01-20 MED ORDER — CLINDAMYCIN PHOSPHATE 1 % EX LOTN *I*
TOPICAL_LOTION | CUTANEOUS | 3 refills | Status: DC
Start: 2020-01-20 — End: 2020-06-28
  Filled 2020-01-20 – 2020-02-07 (×2): qty 60, 30d supply, fill #0
  Filled 2020-03-08: qty 60, 30d supply, fill #1
  Filled 2020-04-06: qty 60, 30d supply, fill #2

## 2020-01-20 MED ORDER — TRETINOIN 0.05 % EX CREA *I*
TOPICAL_CREAM | Freq: Every evening | CUTANEOUS | 2 refills | Status: DC
Start: 2020-01-20 — End: 2020-06-28
  Filled 2020-01-20: qty 45, 31d supply, fill #0
  Filled 2020-04-06: qty 45, 36d supply, fill #0

## 2020-01-20 NOTE — Progress Notes (Signed)
Dermatology Progress Note    Chief Complaint   Patient presents with    Follow-up     acne vulgaris     ?  HPI:   [name] is a pleasant 44 y.o. female here for the concern noted below.  ?  Last visit: 10/20/19 with Leotis Shames NP   Concern: acne on face. Ongoing for years   Prior treatment: dove soap, BP gel 5% not helpful, vaseline, baby oil   Currently using tretinoin 0.05% cream 2-3 times a week at night , bpo wash QAM and clindamycin lotion, she is very happy with treatment results. Her skin is nearly clear.     Physical Exam:   Vitals:    01/20/20 1106   BP: (!) 162/98   Temp: 36.1 C (97 F)   Weight: 83.9 kg (185 lb)   Height: 1.727 m (5\' 8" )     Skin: All of the following were examined, and were within normal limits, except as noted: Face, Ears, Scalp/Hair, Eyes/Eyelids, Lips and Neck  Face few open comedones over cheeks   ??    Assessment/Plan:  1. Acne, mild, well controlled  -Diagnosis and treatment options discussed  -continue benzoyl peroxide 4% wash to face and shoulders every morning  -wash with dove bar soap nightly  -increase tretinoin 0.05% cream to nightly. Use a pea size amount  -continue clindamycin lotion every morning   -PCP can refill topicals as needed    Return to clinic: PRN    , NP   01/20/20  4:37 PM

## 2020-01-21 ENCOUNTER — Other Ambulatory Visit: Payer: Self-pay

## 2020-01-27 ENCOUNTER — Other Ambulatory Visit: Payer: Self-pay

## 2020-01-31 ENCOUNTER — Other Ambulatory Visit: Payer: Self-pay

## 2020-02-02 ENCOUNTER — Ambulatory Visit: Payer: Self-pay | Attending: Internal Medicine

## 2020-02-02 DIAGNOSIS — Z23 Encounter for immunization: Secondary | ICD-10-CM

## 2020-02-02 NOTE — Progress Notes (Signed)
   Covid-19 Vaccination Clinic  Name:  Sherri Kelly    MRN: [id number] DOB: 04/29/1976  02/02/2020  [name] was observed post Covid-19 immunization for 15 minutes without incident. She was provided with Vaccine Information Sheet and instruction to access the V-Safe system.   [name] was instructed to call 911 with any severe reactions post vaccine: Marland Kitchen Difficulty breathing  . Swelling of face and throat  . A fast heartbeat  . A bad rash all over body  . Dizziness and weakness   Immunizations Administered    Name Date Dose VIS Date Route   Pfizer COVID-19 Vaccine 02/02/2020  1:08 PM 0.3 mL 10/01/2019 Intramuscular   Manufacturer: [employer], [employer]   Lot: [id number]   NDC: [address]-0

## 2020-02-04 ENCOUNTER — Other Ambulatory Visit: Payer: Self-pay

## 2020-02-07 ENCOUNTER — Other Ambulatory Visit: Payer: Self-pay | Admitting: Gastroenterology

## 2020-02-07 ENCOUNTER — Other Ambulatory Visit: Payer: Self-pay

## 2020-02-07 MED ORDER — PREGABALIN 150 MG PO CAPS *I*
150.0000 mg | ORAL_CAPSULE | Freq: Two times a day (BID) | ORAL | 2 refills | Status: DC
Start: 2020-02-06 — End: 2020-03-08
  Filled 2020-02-07: qty 60, 30d supply, fill #0
  Filled 2020-03-06: qty 60, 30d supply, fill #1

## 2020-02-07 MED ORDER — OXYCODONE-ACETAMINOPHEN 5-325 MG PO TABS *I*
1.0000 | ORAL_TABLET | Freq: Three times a day (TID) | ORAL | 0 refills | Status: DC | PRN
Start: 2020-02-06 — End: 2020-03-08
  Filled 2020-02-07: qty 90, 30d supply, fill #0

## 2020-02-09 ENCOUNTER — Other Ambulatory Visit: Payer: Self-pay

## 2020-02-09 MED ORDER — SUCRALFATE 1 GM PO TABS *I*
ORAL_TABLET | ORAL | 2 refills | Status: DC
Start: 2020-02-09 — End: 2020-07-11
  Filled 2020-02-09: qty 60, 30d supply, fill #0
  Filled 2020-04-06: qty 60, 30d supply, fill #1

## 2020-02-15 ENCOUNTER — Other Ambulatory Visit: Payer: Self-pay

## 2020-02-15 ENCOUNTER — Encounter: Payer: Self-pay | Admitting: Physical Medicine and Rehabilitation

## 2020-02-15 ENCOUNTER — Ambulatory Visit: Payer: Medicaid (Managed Care) | Admitting: Physical Medicine and Rehabilitation

## 2020-02-15 VITALS — BP 156/96 | HR 94 | Ht 68.0 in | Wt 179.4 lb

## 2020-02-15 DIAGNOSIS — M5417 Radiculopathy, lumbosacral region: Secondary | ICD-10-CM

## 2020-02-15 DIAGNOSIS — M48061 Spinal stenosis, lumbar region without neurogenic claudication: Secondary | ICD-10-CM

## 2020-02-15 DIAGNOSIS — M4807 Spinal stenosis, lumbosacral region: Secondary | ICD-10-CM

## 2020-02-15 NOTE — Progress Notes (Signed)
[name]  04/10/1976  [id number]      CC: Follow-up low back right-sided low back and right leg pain    HPI: Patient is seen in follow-up consultation for low back pain with radiation down the right lower extremity.  She is is approximately 10 weeks status post 2 right L4 and L5 transforaminal selective nerve root blocks.  She achieved a 70% reduction in her pain for about 2 weeks.    Her symptoms are aggravated with walking and standing and mitigated with lying down.  Patient rates her pain as an 8/10.    She knows that she is eligible for 1/3 injection but the procedures are too painful and her relief not long enough to justify going through another injection.    When she was in [place], [place] she received the injections under sedation.    Patient is at the point where she would like to consult with a surgeon.    Current treatment: Percocet 5/325 3 times daily, Lyrica 150 mg twice daily, ibuprofen 600 mg every 6 hours as needed for pain, medical marijuana    Patient denies  Progressive lower extremity weakness, constitutional symptoms or bowel or bladder dysfunction.    Physical exam:    Vitals:    02/15/20 0806   BP: (!) 156/96   Pulse: 94   Weight: 81.4 kg (179 lb 6.4 oz)   Height: 1.727 m (5\' 8" )     Pleasant, alert and oriented female in no acute distress.  Gait without antalgia.  Patient able to heel walk and toe walk but has give way weakness due to pain.  Strength intact 5 out of 5 in the lower extremities bilaterally across all lumbar myotomes.    Impression:  Progressive low back and probable right L4 and L5 lumbosacral radicular pain in a patient with mild right foraminal and recess stenosis L4-5 as well as moderate right foraminal stenosis L5-S1 with a degenerative lumbar scoliosis     Plan:    Treatment options were discussed with patient.    Patient would like a surgical consultation.  She was referred to Dr. .  An updated MRI of the lumbar spine will be obtained in anticipation of that  consultation.    I will see patient back if she chooses not to pursue surgery or if his surgical intervention is not offered.    Please feel free to contact me with any questions or concerns that you may have.    Delila Spence, Duffy Rhody

## 2020-02-17 ENCOUNTER — Other Ambulatory Visit: Payer: Self-pay

## 2020-02-24 ENCOUNTER — Ambulatory Visit: Payer: Medicaid (Managed Care)

## 2020-02-25 ENCOUNTER — Other Ambulatory Visit: Payer: Self-pay

## 2020-02-26 ENCOUNTER — Ambulatory Visit
Admission: RE | Admit: 2020-02-26 | Discharge: 2020-02-26 | Disposition: A | Discharge status: Home / Self Care | Payer: Medicaid (Managed Care) | Source: Ambulatory Visit | Attending: Physical Medicine and Rehabilitation | Admitting: Physical Medicine and Rehabilitation

## 2020-02-26 DIAGNOSIS — M4807 Spinal stenosis, lumbosacral region: Secondary | ICD-10-CM

## 2020-02-26 DIAGNOSIS — M47817 Spondylosis without myelopathy or radiculopathy, lumbosacral region: Secondary | ICD-10-CM | POA: Insufficient documentation

## 2020-02-26 DIAGNOSIS — M5126 Other intervertebral disc displacement, lumbar region: Secondary | ICD-10-CM | POA: Insufficient documentation

## 2020-03-01 ENCOUNTER — Ambulatory Visit: Payer: Medicaid (Managed Care) | Admitting: Orthopedic Surgery

## 2020-03-01 ENCOUNTER — Ambulatory Visit
Admission: RE | Admit: 2020-03-01 | Discharge: 2020-03-01 | Disposition: A | Discharge status: Home / Self Care | Payer: Medicaid (Managed Care) | Source: Ambulatory Visit | Attending: Orthopedic Surgery | Admitting: Orthopedic Surgery

## 2020-03-01 ENCOUNTER — Encounter: Payer: Self-pay | Admitting: Orthopedic Surgery

## 2020-03-01 VITALS — Ht 68.0 in | Wt 179.0 lb

## 2020-03-01 DIAGNOSIS — M5440 Lumbago with sciatica, unspecified side: Secondary | ICD-10-CM | POA: Insufficient documentation

## 2020-03-01 DIAGNOSIS — M4120 Other idiopathic scoliosis, site unspecified: Secondary | ICD-10-CM

## 2020-03-01 DIAGNOSIS — M8588 Other specified disorders of bone density and structure, other site: Secondary | ICD-10-CM

## 2020-03-01 DIAGNOSIS — M4724 Other spondylosis with radiculopathy, thoracic region: Secondary | ICD-10-CM | POA: Insufficient documentation

## 2020-03-01 DIAGNOSIS — M419 Scoliosis, unspecified: Secondary | ICD-10-CM | POA: Insufficient documentation

## 2020-03-01 DIAGNOSIS — M5416 Radiculopathy, lumbar region: Secondary | ICD-10-CM | POA: Insufficient documentation

## 2020-03-01 DIAGNOSIS — M4726 Other spondylosis with radiculopathy, lumbar region: Secondary | ICD-10-CM | POA: Insufficient documentation

## 2020-03-01 NOTE — Progress Notes (Signed)
HISTORY OF PRESENT ILLNESS:   [name] is a 44 y.o. female referred by Dr. Allena Katz, for an initial comprehensive spinal evaluation with a main complaint of low back pain.  She has scoliosis of the thoracolumbar spine but does not recall a specific time she is diagnosed with this.  She endorses back pain for many years, states it was may be acutely exacerbated after she had her last child in 2007.  She started feeling pain and numbness rating down the right buttock and posterior thigh after an ACL reconstruction in [place] about 4 years ago.  She is also had multiple arthroscopic surgeries with Dr. Idamae Lusher since coming to [facility].  She moved from [place] to [place] about 4 years ago.    There is radiation down the right leg. Pain radiates down the posterior thigh. There is numbness down the posterior thigh. Back pain is worse than leg pain. Patient endorses weakness.  She denies any symptoms on the left lower extremity.  She denies any bowel or bladder issues.    Patient's symptoms are worsened with walking, standing and bending backwards. She reports that formal physical therapy, acetaminophen, and medical marijuana improves her symptoms.  She has done formal physical therapy for many years.  She has not participated in formal physical therapy for more than a year, since the coronavirus pandemic hit, but is been doing home exercises.  She states that physical therapy initially helped, but now she has plateaued. She has tried using lyrica and percocet without alleviation of pain.  She states she has had more than 10 spinal injections, none in [place].  She has received 2 in [place], and she reports about 80% symptomatic relief which lasted for 2 to 4 weeks, but then she is back to baseline.  She has not had previous spine surgery.    Patient reports no previous spinal injection. She reports no previous spinal surgery. Sleeping patterns have been interrupted. She has difficulty with her  daily activities. She reports no changes in her bowel or bladder habits, no incontinence. She reports no recent fevers, chills, or night sweats. No chest pain or shortness of breath.    She smokes about half pack per day, which has been a cutdown over the past year.    The past medical history, surgical history, allergies, medications, review of systems, family history and social history are reviewed by me in eRecord.     Pain    03/01/20 0853   PainSc:   8   PainLoc: Back       Past Medical History:   Diagnosis Date    Anemia     Anxiety 01/12/2016    Arthritis     Depression     DJD (degenerative joint disease), lumbar     small disc disease    DUB (dysfunctional uterine bleeding)     Fibromyalgia 01/12/2016    GERD (gastroesophageal reflux disease)     High blood pressure     Hypertension     Nicotine dependence 01/12/2016    Peptic ulcer     Sciatica of right side     Sleep apnea         Past Surgical History:   Procedure Laterality Date    ANTERIOR CRUCIATE LIGAMENT REPAIR Right 01/24/2015    CESAREAN SECTION, LOW TRANSVERSE  2007    CHOLECYSTECTOMY  1996    COLONOSCOPY      PR ARTHRS KNE SURG W/MENISCECTOMY MED/LAT W/SHVG Left 09/11/2016  Procedure: LEFT KNEE ARTHROSCOPY WITH PARTIAL LATERAL MENISCECTOMY, CHONDROPLASTY    ;  Surgeon: Arvil Persons, MD;  Location: [facility];  Service: Orthopedics    PR KNEE SCOPE,DIAGNOSTIC Right 08/17/2018    Procedure: KNEE ARTHROSCOPY DIAGNOSTIC;  Surgeon: Arvil Persons, MD;  Location: [facility];  Service: Orthopedics    PR KNEE SCOPE,PART SYNOVECT Right 08/17/2018    Procedure: KNEE ARTHROSCOPY WITH SYNOVECTOMY;  Surgeon: Arvil Persons, MD;  Location: [facility];  Service: Orthopedics    PR KNEE SCOPE,SHAVE ARTICULAR CART Right 08/17/2018    Procedure: RIGHT KNEE ARTHROSCOPY WITH CHONDROPLASTY;  Surgeon: Arvil Persons, MD;  Location: [facility];  Service: Orthopedics          Current Outpatient Medications:     sucralfate  (CARAFATE) 1 GM tablet, TAKE 1 TABLET BY MOUTH TWO TIMES A DAY AS NEEDED., Disp: 60 tablet, Rfl: 2    oxyCODONE-acetaminophen (PERCOCET) 5-325 MG per tablet, Take 1 tablet by mouth 3 times daily as needed. Max daily dose: 3 tablets, Disp: 90 tablet, Rfl: 0    pregabalin (LYRICA) 150 MG capsule, Take 1 capsule (150 mg total) by mouth 2 times daily. Max daily dose: 2 capsules., Disp: 60 capsule, Rfl: 2    tretinoin (RETIN-A) 0.05 % cream, Apply a pea size amount topically to entire face nightly., Disp: 45 g, Rfl: 2    clindamycin (CLEOCIN T) 1 % lotion, Apply a thin layer to the face each morning, Disp: 60 mL, Rfl: 3    amLODIPine (NORVASC) 10 MG tablet, Take 1 tablet (10 mg total) by mouth daily., Disp: 30 tablet, Rfl: 8    docusate sodium (COLACE) 100 MG capsule, Take 1 capsule (100 mg total) by mouth up to 2 times daily as needed for constipation., Disp: 60 capsule, Rfl: 8    ferrous sulfate (FERROUS SULFATE) 325 (65 FE) MG tablet, Take 1 tablet (325 mg total) by mouth 2 times daily for iron supplementation., Disp: 60 tablet, Rfl: 8    Ferrous Sulfate 324 (65 Fe) MG TBEC, Take 1 tablet by mouth every other day (Patient not taking: Reported on 02/15/2020), Disp: 15 tablet, Rfl: 5    ibuprofen (ADVIL,MOTRIN) 600 MG tablet, Take 1 tablet by mouth every 6 (six) hours as needed for Pain., Disp: 60 tablet, Rfl: 2    diphenhydrAMINE (BENADRYL) 25 MG oral solid, Take 1 each (25 mg total) by mouth every 8 hours for itching., Disp: 90 each, Rfl: 3    lidocaine (LIDODERM) 5 % patch, Apply one patch to the most painful aspect of low back for up to 12 hours a day as needed for pain (Patient not taking: Reported on 02/15/2020), Disp: 30 patch, Rfl: 2    cloNIDine (CATAPRES) 0.1 MG tablet, Take 2 tablets by mouth at bedtime, May also take 1 tablet twice daily for anxiety as directed  (Patient not taking: Reported on 02/15/2020), Disp: 120 tablet, Rfl: 2    albuterol HFA (PROVENTIL, VENTOLIN, PROAIR HFA) 108 (90 Base)  MCG/ACT inhaler, Inhale 2 puffs by mouth into the lungs every 6 hours as needed for shortness of breath., Disp: 18 g, Rfl: 5    Spacer/Aero-Holding Chambers (OPTICHAMBER DIAMOND) MISC, Use as directed, Disp: 1 each, Rfl: 0    beclomethasone HFA (QVAR REDIHALER) 40 MCG/ACT redihaler, Inhale 1 puff by mouth into the lungs 2 times daily., Disp: 10.6 g, Rfl: 8    polyethylene glycol (GLYCOLAX) 17 GM/SCOOP powder, MIX 17 GRAMS IN A LARGE GLASS OF FLUID  AND TAKE BY MOUTH ONCE DAILY TO PREVENT CONSTIPATION., Disp: 510 g, Rfl: 5    pantoprazole (PROTONIX) 40 MG EC tablet, TAKE 1 TABLET BY MOUTH TWO TIMES A DAY. (SWALLOW WHOLE: DO NOT CRUSH, BREAK OR CHEW), Disp: 60 tablet, Rfl: 5    norethindrone (AYGESTIN) 5 MG tablet, Take 2 tablets (10 mg total) by mouth daily (Patient not taking: Reported on 12/03/2019), Disp: 60 tablet, Rfl: 3    umeclidinium bromide (INCRUSE ELLIPTA) 62.5 MCG/INH AEPB, INHALE 1 PUFF BY MOUTH ONCE DAILY., Disp: 30 each, Rfl: 10    Medical marijuana, as needed, Disp: , Rfl:     ondansetron (ZOFRAN-ODT) 4 MG disintegrating tablet, Take 1 tablet (4 mg total) by mouth 3 times daily as needed   Place on top of tongue. (Patient not taking: Reported on 12/03/2019), Disp: 30 tablet, Rfl: 0    oxyCODONE-acetaminophen (PERCOCET) 5-325 MG per tablet, Take 1 tablet by mouth every 6 hours as needed   Max daily dose: 4 tablets (Patient not taking: Reported on 12/03/2019), Disp: 20 tablet, Rfl: 0    amLODIPine (NORVASC) 10 MG tablet, Take 1 tablet (10 mg total) by mouth daily (Patient not taking: Reported on 12/03/2019), Disp: 30 tablet, Rfl: 5    ferrous sulfate 325 (65 FE) MG tablet, Take 1 tablet (325 mg total) by mouth daily (with breakfast), Disp: 100 tablet, Rfl: 3    REVIEW OF SYSTEMS:   Pertinent findings per HPI, all other systems negative.    PHYSICAL EXAMINATION:   Vitals:    03/01/20 0851   Weight: 81.2 kg (179 lb)   Height: 1.727 m (5\' 8" )       On physical exam, [name] is well developed,  well nourished, in no acute distress, alert and oriented x 3.     A complete musculoskeletal exam was performed. The spine is well compensated in the sagittal plane, however she is shifted right in the coronal plan. The patient is able to toe walk and heel walk without difficulty. Lumbar ROM is limited to flexion and extension and limited to lateral rotation. ROM of hips is full. Bilateral motor testing in the lower extremities is 5 out of 5 in all muscle groups. Sensation to light touch is grossly intact in the lower extremities bilaterally, with exception to decreased sensation over the dorsal and plantar right foot. DTR's are present and symmetric throughout bilaterally. Straight leg raise is negative bilaterally. No long tract findings noted bilaterally. Less than 3 beat clonus bilaterally. Bilateral dorsalis pedal and posterior tibialis pulses are intact. Babinski sign is downgoing bilaterally. Examination of the peripheral vascular system demonstrates no evidence of congestion or edema bilaterally.     RADIOLOGY: I personally reviewed the patient's X-ray and MRI with the patient. I also reviewed the radiology report.     X-rays of the lumbar spine obtained today demonstrate a right-sided thoracolumbar curve in the coronal plane.    MRI of the lumbar spine obtained on 5/8 demonstrates a widely patent spinal canal without central stenosis, mild right-sided L3-L4 foraminal stenosis and mild to moderate L5-S1 foraminal stenosis.     ASSESSMENT:   1. lumbar back pain to due to scoliotic deformity  2. possible lumbar radiculopathy    PLAN: A lengthy discussion was held with [name] regarding her condition. The nature and prognosis of the diagnosis was discussed with the patient in detail.  As discussed that her right-sided lumbar back pain with radiation down the posterior thigh is likely related to  her thoracolumbar scoliosis.  She has tried many conservative modalities including physical therapy, steroid  injections, multiple medications without significant relief.  As discussed that surgical correction of her scoliotic deformity may lead to improvement in her back pain, however it is a significant surgery and it would require her to quit smoking completely for 6 weeks, and a subsequent blood test.    The patients questions were sought and answered satisfactorily. She [name] return for a followup visit when she is able to quit smoking to discuss further steps and planning for surgery.     Leonides Grills, MD  Orthopedic Surgery Resident  03/01/2020 at 9:52 AM     Answers for HPI/ROS submitted by the patient on 02/26/2020  What is your goal for today's visit?: Discuss surgery  Handedness: Right Handed  Date of onset: : 03/22/2006  Was this the result of an injury?: No  What is your pain level?: 8/10  Please describe the quality of your pain: : burning, sharp, shooting, spasm  What diagnostic workup have you had for this condition?: MRI scan  What treatments have you tried for this condition?: acetominophen, aquatic therapy, gabapentin, heat, muscle relaxants, narcotics, pain clinic, physical therapy, steroid cream/iontophoresis, TENS, trigger point injections  Is this a work related condition? : No  Current work status: : no work  Fever: No  Chills: No  Weight loss: No  Malaise/fatigue: No  Rash: No  Dry skin: No  Itching: No  Wound : No  Headache: No  Hearing loss: No  Sore throat: No  Dental problem: No  Blindness: No  Visual disturbance: No  Chest pain: No  Palpitations: No  Leg cramps with exercise: No  Leg swelling: No  Fainting: No  Heartburn: No  Nausea: No  Vomiting: No  Diarrhea: No  Constipation: No  Blood in stool: No  Cough : No  Apnea: Yes  Shortness of breath: No  Frequency: No  Urgency: No  Blood in urine: No  Incomplete emptying: No  Falls: No  Neck pain: No  Back pain: Yes  Joint swelling: No  Muscle spasms: Yes  Muscle cramps: No  Easy to bruise/bleed: No  Frequent infections: No  Numbness: No  Tingling:  No  Seizures: No  Loss of consciousness: No  Weakness: No  Depression: No  Nervous/anxious: Yes  Memory loss: No

## 2020-03-01 NOTE — Progress Notes (Signed)
I saw and evaluated the patient and agree with the assessment and plan as documented by the resident.  Patient presents for initial comprehensive spinal evaluation with symptoms of right low back pain with tilting to the right side and radiating pain to the right buttock, right posterior thigh and to the right posterior knee.  She also reports numbness in the same region and reports no tingling.  She reports difficulty ambulating due to her pain.  Symptoms improve when laying down with pillows behind the knee.  Her history is significant for previous right ACL reconstruction and reports of posterior thigh pain and knee pain began after the ACL reconstruction.  She has also had right knee scopes.  She reports no changes in her bowel or bladder habits, no incontinence.  She reports no recent fevers, chills or night sweats.  No chest pain or shortness of breath.  Current treatment including the right L4 and L5 transforaminal selective nerve root block provided about 80% improvement of her symptoms for 2 to 4 weeks.  She reports some improvement of her symptoms with marijuana.  She reports no significant improvement with Lyrica and Percocet.  She reports previous physical therapy and home exercises which previously helped her symptoms but no longer provide any significant improvement.  She reports no previous spinal surgery.  She reports sense of scoliosis but only diagnosed in her 32s.    On examination, the patient is tender to palpation over the right thoracolumbar paraspinal muscle region.  She has a right truncal shift with tilting to the right side.  On examination, she has 5/5 strength throughout bilateral lower extremities with intact sensation to light touch throughout.  She reports decreased sensation to light touch over the right dorsal and plantar foot.    I personally reviewed the patient's scoliosis x-rays and MRI of the lumbar spine with the patient.  We also reviewed the radiology report.    Scoliosis  x-rays obtained today significant for 50 degree right T10-L4 thoracolumbar curve with apex at the T12-L1 level.  There is moderate to severe multilevel spondylosis.  Some loss of normal lumbar lordosis.  Overall coronal and sagittal alignment is well-maintained.    Review of MRI of the lumbar spine obtained on 02/26/2020 is significant for moderate left T12/L1 neuroforaminal stenosis.    There is moderate left L1-2 neuroforaminal stenosis.    There is moderate left L2-3 neuroforaminal stenosis.    There is mild to moderate right L3-4 neuroforaminal stenosis.    There is moderate right and mild left neuroforaminal stenosis at the L5-S1 level.    A lengthy discussion was had with the patient regarding her symptoms, physical exam findings, imaging findings, and treatment plan.  She has idiopathic scoliosis with increasing back pain despite extensive conservative management.  Her history is also significant for diagnosis of fibromyalgia.  The patient currently smokes half a pack of cigarettes per day.  We discussed continued nonoperative management including physical therapy, medications and possible injections.  Her back pain and right posterior thigh pain is likely secondary to scoliosis and back pain leading to posterior thigh pain.  Low suspicion for radiculopathy.  We also discussed considerations for surgical intervention considering her significant scoliosis.  Surgery might provide some improvement of her back pain due to scoliosis and might improve her right posterior thigh pain.  Surgery however remains a significant undertaking.  She was strongly encouraged to quit smoking before proceeding with any surgical intervention.  We would ideally prefer for the patient  to quit smoking and check her nicotine level to confirm she has discontinued use of cigarette before proceeding with any surgical intervention.  We will plan to obtain MRI of the thoracic spine and CT scan of the thoracic and lumbar spine prior to  proceeding with any surgery when patient is ready.  The patient's questions were sought and answered satisfactorily.  She is happy with the plan.  She will follow-up as needed.  Answers for HPI/ROS submitted by the patient on 02/26/2020  What is your goal for today's visit?: Discuss surgery  Handedness: Right Handed  Date of onset: : 03/22/2006  Was this the result of an injury?: No  What is your pain level?: 8/10  Please describe the quality of your pain: : burning, sharp, shooting, spasm  What diagnostic workup have you had for this condition?: MRI scan  What treatments have you tried for this condition?: acetominophen, aquatic therapy, gabapentin, heat, muscle relaxants, narcotics, pain clinic, physical therapy, steroid cream/iontophoresis, TENS, trigger point injections  Is this a work related condition? : No  Current work status: : no work  Fever: No  Chills: No  Weight loss: No  Malaise/fatigue: No  Rash: No  Dry skin: No  Itching: No  Wound : No  Headache: No  Hearing loss: No  Sore throat: No  Dental problem: No  Blindness: No  Visual disturbance: No  Chest pain: No  Palpitations: No  Leg cramps with exercise: No  Leg swelling: No  Fainting: No  Heartburn: No  Nausea: No  Vomiting: No  Diarrhea: No  Constipation: No  Blood in stool: No  Cough : No  Apnea: Yes  Shortness of breath: No  Frequency: No  Urgency: No  Blood in urine: No  Incomplete emptying: No  Falls: No  Neck pain: No  Back pain: Yes  Joint swelling: No  Muscle spasms: Yes  Muscle cramps: No  Easy to bruise/bleed: No  Frequent infections: No  Numbness: No  Tingling: No  Seizures: No  Loss of consciousness: No  Weakness: No  Depression: No  Nervous/anxious: Yes  Memory loss: No

## 2020-03-06 ENCOUNTER — Other Ambulatory Visit: Payer: Self-pay

## 2020-03-06 ENCOUNTER — Other Ambulatory Visit
Admission: RE | Admit: 2020-03-06 | Discharge: 2020-03-06 | Disposition: A | Discharge status: Home / Self Care | Payer: Medicaid (Managed Care) | Source: Ambulatory Visit | Attending: Internal Medicine | Admitting: Internal Medicine

## 2020-03-06 DIAGNOSIS — K625 Hemorrhage of anus and rectum: Secondary | ICD-10-CM | POA: Insufficient documentation

## 2020-03-06 DIAGNOSIS — Z79891 Long term (current) use of opiate analgesic: Secondary | ICD-10-CM | POA: Insufficient documentation

## 2020-03-06 LAB — CBC
Hematocrit: 35 % (ref 34–45)
Hemoglobin: 11.2 g/dL (ref 11.2–15.7)
MCH: 26 pg (ref 26–32)
MCHC: 32 g/dL (ref 32–36)
MCV: 81 fL (ref 79–95)
Platelets: 352 10*3/uL (ref 160–370)
RBC: 4.3 MIL/uL (ref 3.9–5.2)
RDW: 22.2 % — ABNORMAL HIGH (ref 11.7–14.4)
WBC: 8.2 10*3/uL (ref 4.0–10.0)

## 2020-03-06 LAB — PAIN CLINIC PROFILE
Amphetamine,UR: NEGATIVE
Benzodiazepinen,UR: NEGATIVE
Cocaine/Metab,UR: NEGATIVE
Opiates,UR: NEGATIVE
Oxycodone/Oxymorphone,UR: POSITIVE
THC Metabolite,UR: POSITIVE

## 2020-03-06 LAB — FERRITIN: Ferritin: 21 ng/mL (ref 10–120)

## 2020-03-06 MED ORDER — FERROUS SULFATE 324 (65 FE) MG PO TBEC
324.0000 mg | DELAYED_RELEASE_TABLET | ORAL | 8 refills | Status: AC
Start: 2020-03-06 — End: ?
  Filled 2020-03-06: qty 15, 30d supply, fill #0
  Filled 2020-04-06: qty 15, 30d supply, fill #1

## 2020-03-06 MED ORDER — NAPROXEN 500 MG PO TABS *I*
500.0000 mg | ORAL_TABLET | Freq: Two times a day (BID) | ORAL | 5 refills | Status: AC
Start: 2020-03-06 — End: ?
  Filled 2020-03-06: qty 60, 30d supply, fill #0
  Filled 2020-04-06: qty 60, 30d supply, fill #1

## 2020-03-06 MED ORDER — NICOTINE 7 MG/24HR TD PT24 *I*
1.0000 | MEDICATED_PATCH | TRANSDERMAL | 2 refills | Status: AC
Start: 2020-03-06 — End: ?
  Filled 2020-03-06: qty 28, 28d supply, fill #0
  Filled 2020-04-06: qty 28, 28d supply, fill #1

## 2020-03-06 MED ORDER — NICOTINE POLACRILEX 2 MG MT GUM *I*
2.0000 mg | CHEWING_GUM | OROMUCOSAL | 2 refills | Status: AC
Start: 2020-03-06 — End: ?
  Filled 2020-03-06: qty 300, 15d supply, fill #0
  Filled 2020-04-06: qty 300, 15d supply, fill #1

## 2020-03-06 MED ORDER — FLUTICASONE PROPIONATE 50 MCG/ACT NA SUSP *I*
1.0000 | Freq: Every day | NASAL | 8 refills | Status: AC
Start: 2020-03-06 — End: ?
  Filled 2020-03-06: qty 16, 60d supply, fill #0

## 2020-03-06 MED ORDER — PSYLLIUM 43 % PO POWD *I*
ORAL | 8 refills | Status: AC
Start: 2020-03-06 — End: ?
  Filled 2020-03-06: qty 369, 52d supply, fill #0

## 2020-03-06 MED ORDER — LORATADINE 10 MG PO TABS *I*
10.0000 mg | ORAL_TABLET | Freq: Every day | ORAL | 8 refills | Status: AC
Start: 2020-03-06 — End: ?
  Filled 2020-03-06: qty 30, 30d supply, fill #0
  Filled 2020-04-06: qty 30, 30d supply, fill #1

## 2020-03-07 ENCOUNTER — Other Ambulatory Visit: Payer: Self-pay

## 2020-03-08 ENCOUNTER — Other Ambulatory Visit: Payer: Self-pay

## 2020-03-08 LAB — CONFIRM OPIATES: Confirm Opiates: POSITIVE

## 2020-03-08 MED ORDER — OXYCODONE-ACETAMINOPHEN 5-325 MG PO TABS *I*
1.0000 | ORAL_TABLET | Freq: Three times a day (TID) | ORAL | 0 refills | Status: DC | PRN
Start: 2020-03-08 — End: 2020-04-07
  Filled 2020-03-08: qty 90, 30d supply, fill #0

## 2020-03-08 MED ORDER — NORETHINDRONE ACETATE 5 MG PO TABS *I*
10.0000 mg | ORAL_TABLET | Freq: Every day | ORAL | 3 refills | Status: DC
Start: 2020-03-08 — End: 2021-12-17

## 2020-03-08 MED ORDER — PREGABALIN 150 MG PO CAPS *I*
150.0000 mg | ORAL_CAPSULE | Freq: Every evening | ORAL | 0 refills | Status: DC
Start: 2020-03-08 — End: 2020-04-07
  Filled 2020-03-08: qty 30, 30d supply, fill #0

## 2020-03-09 LAB — CONFIRM THC METABOLITE, URINE: Confirm THC Metab: POSITIVE

## 2020-03-16 ENCOUNTER — Other Ambulatory Visit: Payer: Self-pay

## 2020-03-17 ENCOUNTER — Other Ambulatory Visit: Payer: Self-pay

## 2020-04-03 ENCOUNTER — Other Ambulatory Visit: Payer: Self-pay

## 2020-04-05 ENCOUNTER — Other Ambulatory Visit: Payer: Self-pay

## 2020-04-06 ENCOUNTER — Other Ambulatory Visit: Payer: Self-pay

## 2020-04-06 ENCOUNTER — Other Ambulatory Visit: Payer: Self-pay | Admitting: Internal Medicine

## 2020-04-06 MED ORDER — NORETHINDRONE ACETATE 5 MG PO TABS *I*
10.0000 mg | ORAL_TABLET | Freq: Every day | ORAL | 3 refills | Status: AC
Start: 2020-04-06 — End: ?
  Filled 2020-04-06: qty 60, 30d supply, fill #0

## 2020-04-07 ENCOUNTER — Other Ambulatory Visit: Payer: Self-pay

## 2020-04-07 MED ORDER — PANTOPRAZOLE SODIUM 40 MG PO TBEC *I*
DELAYED_RELEASE_TABLET | ORAL | 5 refills | Status: AC
Start: 2020-04-07 — End: 2022-08-06
  Filled 2020-04-07: qty 60, 30d supply, fill #0

## 2020-04-07 MED ORDER — OXYCODONE-ACETAMINOPHEN 5-325 MG PO TABS *I*
1.0000 | ORAL_TABLET | Freq: Three times a day (TID) | ORAL | 0 refills | Status: DC | PRN
Start: 2020-04-07 — End: 2020-05-04
  Filled 2020-04-07: qty 90, 30d supply, fill #0

## 2020-04-07 MED ORDER — PREGABALIN 150 MG PO CAPS *I*
150.0000 mg | ORAL_CAPSULE | Freq: Every evening | ORAL | 0 refills | Status: DC
Start: 2020-04-07 — End: 2020-05-04
  Filled 2020-04-07: qty 30, 30d supply, fill #0

## 2020-04-10 ENCOUNTER — Other Ambulatory Visit: Payer: Self-pay

## 2020-04-10 MED ORDER — INCRUSE ELLIPTA 62.5 MCG/ACT IN AEPB
1.0000 | INHALATION_SPRAY | Freq: Every day | RESPIRATORY_TRACT | 10 refills | Status: AC
Start: 2020-04-07 — End: ?
  Filled 2020-04-10: qty 30, 30d supply, fill #0

## 2020-04-11 ENCOUNTER — Other Ambulatory Visit: Payer: Self-pay

## 2020-04-12 ENCOUNTER — Other Ambulatory Visit: Payer: Self-pay

## 2020-04-13 ENCOUNTER — Other Ambulatory Visit: Payer: Self-pay

## 2020-04-20 ENCOUNTER — Other Ambulatory Visit: Payer: Self-pay

## 2020-05-04 ENCOUNTER — Other Ambulatory Visit: Payer: Self-pay

## 2020-05-04 MED ORDER — OXYCODONE-ACETAMINOPHEN 5-325 MG PO TABS *I*
1.0000 | ORAL_TABLET | Freq: Three times a day (TID) | ORAL | 0 refills | Status: AC | PRN
Start: 2020-05-04 — End: ?
  Filled 2020-05-04: qty 90, 30d supply, fill #0

## 2020-05-04 MED ORDER — PREGABALIN 150 MG PO CAPS *I*
150.0000 mg | ORAL_CAPSULE | Freq: Every evening | ORAL | 0 refills | Status: AC
Start: 2020-05-04 — End: ?
  Filled 2020-05-04: qty 30, 30d supply, fill #0

## 2020-05-05 ENCOUNTER — Other Ambulatory Visit: Payer: Self-pay

## 2020-05-08 ENCOUNTER — Other Ambulatory Visit: Payer: Self-pay

## 2020-05-31 ENCOUNTER — Other Ambulatory Visit: Payer: Self-pay

## 2020-06-28 ENCOUNTER — Other Ambulatory Visit: Payer: Self-pay | Admitting: Dermatology

## 2020-06-28 MED ORDER — CLINDAMYCIN PHOSPHATE 1 % EX LOTN *I*
TOPICAL_LOTION | CUTANEOUS | 3 refills | Status: DC
Start: 2020-06-28 — End: 2021-12-27

## 2020-06-28 MED ORDER — TRETINOIN 0.05 % EX CREA *I*
TOPICAL_CREAM | Freq: Every evening | CUTANEOUS | 2 refills | Status: DC
Start: 2020-06-28 — End: 2021-04-30

## 2020-06-28 NOTE — Telephone Encounter (Signed)
Copied from CRM #[id number]. Topic: Medications/Prescriptions - Refill Request  >> Jun 28, 2020 10:50 AM Donnetta Hutching wrote:  Venita Sheffield fro [facility] calling to request prescription(s)   Requested Prescriptions     Pending Prescriptions Disp Refills    clindamycin (CLEOCIN T) 1 % lotion 60 mL 3     Sig: Apply a thin layer to the face each morning    tretinoin (RETIN-A) 0.05 % cream 45 g 2     Sig: Apply a pea size amount topically to entire face nightly.      to be sent to the following Pharmacy [facility].    Is patient out of the medication? yes    Patient's last visit? 01-20-2020    Patient can be reached if necessary at [phone].

## 2020-07-10 ENCOUNTER — Encounter: Payer: Self-pay | Admitting: Gastroenterology

## 2020-07-11 MED ORDER — SUCRALFATE 1 GM PO TABS *I*
ORAL_TABLET | ORAL | 2 refills | Status: DC
Start: 2020-07-11 — End: 2021-04-17

## 2020-07-21 ENCOUNTER — Other Ambulatory Visit
Admission: RE | Admit: 2020-07-21 | Discharge: 2020-07-21 | Disposition: A | Discharge status: Home / Self Care | Payer: Medicaid (Managed Care) | Source: Ambulatory Visit | Attending: Internal Medicine | Admitting: Internal Medicine

## 2020-07-21 DIAGNOSIS — Z79891 Long term (current) use of opiate analgesic: Secondary | ICD-10-CM | POA: Insufficient documentation

## 2020-07-21 LAB — PAIN CLINIC PROFILE
Amphetamine,UR: NEGATIVE
Benzodiazepinen,UR: NEGATIVE
Cocaine/Metab,UR: NEGATIVE
Opiates,UR: NEGATIVE
Oxycodone/Oxymorphone,UR: POSITIVE
THC Metabolite,UR: POSITIVE

## 2020-07-24 ENCOUNTER — Encounter: Payer: Self-pay | Admitting: Gastroenterology

## 2020-07-24 LAB — CONFIRM OPIATES: Confirm Opiates: POSITIVE

## 2020-07-24 LAB — CONFIRM THC METABOLITE, URINE: Confirm THC Metab: POSITIVE

## 2020-08-23 ENCOUNTER — Ambulatory Visit: Payer: Medicaid (Managed Care) | Admitting: Rehabilitative and Restorative Service Providers"

## 2020-08-23 NOTE — Progress Notes (Deleted)
[facility] SPINE THERAPY  LUMBAR SPINE EVALUATION    08/23/2020  Diagnosis: No diagnosis found.  Onset date: {DESC; DATE ACUTE, SUBACUTE, CHRONIC:35567}      SUBJECTIVE    Khloi Rawl is a 44 y.o. female who is present today for {body parts:30899} care.  Mechanism of injury/history of symptoms: {PT Mechanism of injury:26756}    Nature of Symptoms  Relevant symptoms: {PT UE symptoms:26762}   Symptom frequency: {Desc; intermittent/persistent/constant:12478}  Symptom intensity (0 - 10 scale): Now {PAIN CZYSAY:30160} Best {PAIN LEVELS:22940} Worst {PAIN LEVELS:22940}  Symptom location: {anterior/posterior/lateral:12563} {Left/right:33004} {LOCATION; LOW BACK/HIP/BUTTOCK/LEG:34021}  Symptoms worsen with: {Worsening symptoms:26765}   Symptoms improve with: { symptoms improve w/:[phone]}   Prior history of {LOCATION; BACK/HIP/SHOULDER/NECK:23599} pain.  - {YES/NO WITH DEFAULT FUX:32355}  Assistive Device: {Assistive Devices [id number]    Previous Testing and/or Treatment  Diagnostic tests: {Diagnositic tests:26760}   Previous or Concurrent Treatment: {TREATMENT; PT/CHIROPRACTOR/ACUPUNCTURE:2104127}  History spine of surgery:{ yes no n/a:19876}      Medical Screen  Within the last 3 months, patient reports: {SCREEN; DDUK:02542}  History of Cancer: {YES NO:22742:o}  History of Smoking: {YES NO:22742}     Occupation and Activities  Work status: {MISC; WORK HCWCBJ:62831}  Job title/type of work: {Type of DVVO:16073}  Stresses/physical demands of job: {Physical demands o fJob:26759}   Stresses/physical demands of home: { physical demands of home:[phone]}   Physical activity level: Activity {DESC; PHYSICAL ACTIVITY XTGGY:69485}  Sport/Activities: {Misc; sports:19162}      Patients goals for therapy: {goals:26767}    FUNCTION    Functional Status of Walking, Standing or Sitting  Patient is able to {walk,stand, sit:31853} for {DESC; OTS IOEV:03500}        OBJECTIVE    Observation  Patient was able to ambulate into the  department with a {gait:27028} gait pattern.      Posture  Cervcal lordosis: {MISC; NORMAL/INCREASED/DECREASED:33314}  Thoracic kyphosis:{MISC; NORMAL/INCREASED/DECREASED:33314}  Lumbar lordosis: {MISC; NORMAL/INCREASED/DECREASED:33314}    Lateral Deviation: {POSITION; LATERAL SHIFT:37170}    Palpation   {palpation:27032}    Segmental Mobility Assessment  {DESC; SEGMENTAL MOBILITY:37188}    LUMBAR AROM / Movement pattern  Flexion: Reach to {range:37171} with {quality:37172}  Extension:  {ROM:27033} with {quality:37172}  Left Sidebend: Reach to {range:37171} with {quality:37172}  Right Sidebend: Reach to {range:37171} with {quality:37172}  Left Rotation:  {ROM:27033} with {quality:37172}  Right Rotation:  {ROM:27033} with {quality:37172}          LE  {NA/explanation:16022516} LEFT   RIGHT   Strength    PROM AROM PROM AROM Left Right                                                    Neuromuscular Assessment  {Neurologic:23749}      Special Tests  Lumbar:  {Lumbar Tests:29916}  Neurodynamic: {DESC; NEURODYNAMIC TESTS:37174}  Sacroilliac Joint:  {Sacroilliac Tests:29921}  Hip: {Hip Test:23753}        Flexibility:   {flexibility:21244}    Neuromuscular Control  {LUMBAR NEUROMUSCULAR CONTROL:37173}    Mobility / Movement  {LUMBAR MOBILITY MOVEMENT:37176}           Directional Preference Testing  Sitting Correction: symptoms reduced:  {YES/NO:27019}  Standing Correction: symptoms reduced:  {YES/NO:27019}  Directional Preference: {Directional preference:27020}              ASSESSMENT  Findings consistent with 44 y.o. female with ***  with {pain consistent NOMV:67209}.  Significant presentation and prognostic factors include {DESC; PAIN:37185} symptom rating, {DESC; LOW/MEDIUM/HIGH:23555} disability rating, and {DESC; PT CLIN CONDITION:37186} clinical status.   Based on clinical evaluation and assessment, the primary rehabilitation approach will include {DESC; SPINE TREATMENT APPROACH:37187}.    Personal factors affecting  treatment/recovery:   {desc; AMB personal factors affecting therapy:[phone]}  Comorbidities affecting treatment/recovery:   {desc; comorbidities affecting therapy:[phone]}  Clinical presentation:   {desc;clinical presentation:[phone]}  Patient complexity:     {Desc; low/moderate/high:110033} level as indicated by above stability of condition, personal factors, environmental factors and comorbidities in addition to patient symptom presentation and impairments found on physical exam.    Prognosis:  {good/fair/poor:27869}   Contraindications/Precautions/Limitation:  {Contraindications/precautions:23709}    Short Term Goals:{NUMBERS 1-8:23345}{MONTHS/WEEKS/DAYS:23338} {Short term goals:23710}   Long Term Goals:{NUMBERS 1-8:23345}{MONTHS/WEEKS/DAYS:23338} {LTG:29816}    TREATMENT PLAN  Patient/family involved in developing goals and treatment plan: {YES NO:22742}    Recommended Treatment Freq {NUMBERS 0-7:27905} times per {Time; day/week/month:27906} for {NUMBERS 0-7:20272} {Time; day/week/month:29446}    Treatment plan inclusive of:  Exercise:{Exercise:23697}  Manual Techniques:{Manual Techniques:23698}   Modalities:  {Modalities:26249}  Functional: {Functional:23712}      Thank you for the referral of this patient to [place].    Raelene Bott, PT,DPT     Minutes   Time Based CPT  Physical Performance test, Therapeutic Exercise, Therapeutic Activities, NM Re-Education, Manual Therapy, Gait Training, Massage, Aquatic Therapy, Canalith Repositioning, Iontophoresis, Ultrasound, Orthotic fitting/training, Prosthetic fitting ***   Service Based (untimed) CPT  PT/OT evaluation, PT/OT re-eval, E-stim-unattended, Mechanical traction, Vasopneumatic device    Unbilled time (rest, etc)        Total Treatment Time

## 2020-08-26 ENCOUNTER — Ambulatory Visit
Payer: Medicaid (Managed Care) | Attending: Internal Medicine | Admitting: Rehabilitative and Restorative Service Providers"

## 2020-08-26 DIAGNOSIS — M545 Low back pain, unspecified: Secondary | ICD-10-CM | POA: Insufficient documentation

## 2020-08-26 DIAGNOSIS — M5441 Lumbago with sciatica, right side: Secondary | ICD-10-CM | POA: Insufficient documentation

## 2020-08-26 DIAGNOSIS — G8929 Other chronic pain: Secondary | ICD-10-CM | POA: Insufficient documentation

## 2020-08-26 NOTE — Progress Notes (Addendum)
[facility] SPINE THERAPY  LUMBAR SPINE EVALUATION    08/26/2020  Diagnosis:   1. Lower back pain       Onset date: Chronic (12+wks)      SUBJECTIVE    [name] is a 44 y.o. female who is present today for lumbar care.  States she has had chronic back pain; she notices burning in her lower back pain that is worsened with movement, lifting. The pain is worse on the right side and travels down her right leg. She had tried pool a couple of years ago and found this helped her symptoms.  Mechanism of injury/history of symptoms: No specific cause    Nature of Symptoms  Relevant symptoms: Aching, Burning, Pain    Symptom frequency: Constant  Symptom intensity (0 - 10 scale): Now 7 Best 4 Worst 9  Symptom location: Posterior and Central bilateral low back R>L  Symptoms worsen with: Lifting, Standing, Walking   Symptoms improve with: Heat, Aqua therapy, laying on right side, TENS   Prior history of low back pain.  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        OBJECTIVE    Observation  Patient was able to ambulate into the department with a antalgic gait pattern.      Posture    Lateral Deviation: Lateral  trunk shift due to scoliosis    Palpation   Generalized Lumbar Pain    Segmental Mobility Assessment  Hypomobile    LUMBAR AROM / Movement pattern  Flexion: Reach to to feet with normal movement, right rib hump present  Extension:  significant loss with painful arc  Left Sidebend: Reach to proximal knee with painful arc  Right Sidebend: Reach to mid thigh with painful arc  Left Rotation:  minor loss with normal movement  Right Rotation:  moderate loss with normal movement  Hip ROM: WNL  Hip strength: grossly 4/5      Neuromuscular Assessment  Sensation:  LE  Light touch, Intact to gross screen  Myotomes:  LE Intact to gross screen  Heel walking: normal  Toe Walking: normal      Special Tests  Lumbar:  Slump, Right LE  negative, Left LE   negative  Straight Leg Raise,  Right LE  negative, Left LE   negative  Hip: FADIR,  negative  FABER,  negative  Hip Scouring,  negative        Flexibility:   Hamstrings: WNL  Quads: Moderate tightness  Psoas: Moderate tightness    Neuromuscular Control  Isometric: Ability to brace abdomen in sitting/supine -  Abdominal Activation: yes  Isometric:  Ability to contract gluteal muscles in siiting/supine -  Glute activation:  yes    Mobility / Movement  Squat:  to chair, decreased posterior hip hinge  Single leg stance:  LEFT: Duration 5-10 seconds / positive trendelenburg / Postural sway, minimal, RIGHT: Duration 5-10 seconds /  positive trendelenburg / Postural sway,  minimal           Directional Preference Testing  Directional Preference: Neutral     Oswestry: 62%              ASSESSMENT  Findings consistent with 44 y.o. female with lower back pain with scoliosis and right sided sciatica  with pain, ROM limitations, strength limitations, functional limitations.  Significant presentation and prognostic factors include moderate symptom rating, low disability rating, and stable clinical status.   Based on clinical evaluation and assessment, the primary rehabilitation approach will  include symptom modulation, movement control, functional optimization.  Patient would benefit from skilled aquatic therapy to address identified impairments, functional limitations, and pain. Patient currently unable to tolerate land-based intervention and would benefit from therapy in the aquatic environment for greater mobility, decreased joint compression and reduced weight bearing forces. Will transition patient to land-based therapy or independent community program as appropriate.            Personal factors affecting treatment/recovery:   none identified  Comorbidities affecting treatment/recovery:   Scoliosis  Clinical presentation:   stable  Patient complexity:     moderate level as indicated by above stability of condition, personal factors, environmental factors and comorbidities in addition to patient symptom presentation and impairments found on physical exam.    Prognosis:  Fair    Contraindications/Precautions/Limitation:  Per diagnosis    Short Term Goals:2week(s) Decrease c/o max pain to < 4/10 and Minimal assistance with HEP/ education concepts   Long Term Goals:6week(s) Walk community distances without increased symptoms, Stand as long as needed when completing ADL's without increased symptoms, Sit as long as needed with ADL's without increased symptoms, Ambulate stairs up and down without increased symptoms, Drive automobile without increased symptoms, Transfer from sit to stand and perform bed mobility without increased symptoms, Patient demonstrates improved functional core strength, Patient will return to full prior level of function  , Independent with symptom management    TREATMENT PLAN  Patient/family involved in developing goals and treatment plan: Yes    Recommended Treatment Freq 1-2 times per weeks/months for 6 week(s)    Treatment plan inclusive of:  Exercise:AROM, AAROM, PROM, Stretching, Strengthening, Progressive Resistive, Coordination, Aerobic exercise  Manual Techniques:Joint  mobilization, Soft tissue release/massage   Modalities:  Cold pack, Functional/Therapeutic activites per flowsheet, Moist heat, Neuromuscular Re-ed,  Activity as indicated, Ther Exercise per flowsheet  Functional: Proprioception/Dynamic stability, Functional rehab      Thank you for the referral of this patient to [facility].    Juliette Alcide, PT     Minutes   Time Based CPT  Physical Performance test, Therapeutic Exercise, Therapeutic Activities, NM Re-Education, Manual Therapy, Gait Training, Massage, Aquatic Therapy, Canalith Repositioning, Iontophoresis, Ultrasound, Orthotic fitting/training, Prosthetic fitting 10 min   Service Based (untimed) CPT  PT/OT evaluation, PT/OT re-eval, E-stim-unattended, Mechanical traction, Vasopneumatic device 30 min   Unbilled time (rest, etc)        Total Treatment Time 40 min           Bridge 15x   Curl up 2 x 10   Hip flexor stretch 5 x 15"

## 2020-08-30 ENCOUNTER — Ambulatory Visit: Payer: Medicaid (Managed Care)

## 2020-08-30 DIAGNOSIS — M5441 Lumbago with sciatica, right side: Secondary | ICD-10-CM

## 2020-08-30 DIAGNOSIS — G8929 Other chronic pain: Secondary | ICD-10-CM

## 2020-08-30 NOTE — Progress Notes (Signed)
[facility] Orthopedic Sports/Spine Rehabilitation  PT Treatment Note    Todays Date: 08/30/2020    Name: [name]  DOB: [birth year]  Referring Physician: Nanda Quinton, MD  Diagnosis:   1. Chronic right-sided low back pain with right-sided sciatica       Aquatic Therapy Patient Agreement reviewed and signed this date.       Visit #: 2    Subjective:  Pain Assessment: 7  Pt reports no significant changes in pain or function since previous treatment session. Pt notes she has attempted aquatic therapy at the [facility] before with good results but it has been years since then.         Objective:  ROM -  Not Tested   Strength - Therapeutic Exercises per flowsheet, Home program   Function: - Unchanged   Education:  Updated HEP, Verbal cues for ther ex, Manual cues for ther ex    Objective       Walking - Fwd, Bkwd, Lat, March 6 Min Fwd/Back   3 Min Lateral   March-->   Treadmill     Planks 10x10sec B    Side Planks -->   Mini Squats    HS Stretch 3x30sec B   Figure-4 Stretch 3x15sec B    Hip ABD/Flex/Ext 15x B    Standing in Flow    Bike Large Noodle 3 Min    Hang Large Noodle 3 Min          Treatment:  Ther Exercise per flowsheet    Assessment:   Patient tolerated first aquatic therapy session well and would benefit from continued aquatic rehab.          Plan of Care:  Continue per Plan of care -  As written; Patient would benefit from skilled rehabilitation services to address the above impairments to restore functional capacity.    Thank you for referring this patient to [facility] Orthopaedics - Sports and Spine Rehabilitation    Ledell Peoples, ATC,PTA       Minutes   Time Based CPT  Physical Performance test, Therapeutic Exercise, Therapeutic Activities, NM Re-Education, Manual Therapy, Gait Training, Massage, Aquatic Therapy, Canalith Repositioning, Iontophoresis, Ultrasound, Orthotic fitting/training, Prosthetic fitting 3-Aqua   Service Based (untimed) CPT  PT/OT evaluation, PT/OT re-eval,  E-stim-unattended, Mechanical traction, Vasopneumatic device    Unbilled time (rest, etc)        Total Treatment Time

## 2020-09-06 ENCOUNTER — Ambulatory Visit: Payer: Medicaid (Managed Care)

## 2020-09-06 DIAGNOSIS — M5441 Lumbago with sciatica, right side: Secondary | ICD-10-CM

## 2020-09-06 DIAGNOSIS — G8929 Other chronic pain: Secondary | ICD-10-CM

## 2020-09-06 NOTE — Progress Notes (Signed)
[facility] Orthopedic Sports/Spine Rehabilitation  PT Treatment Note    Todays Date: 09/06/2020    Name: [name]  DOB: 08-01-1976  Referring Physician: Nanda Quinton, MD  Diagnosis:   1. Chronic right-sided low back pain with right-sided sciatica           Visit #: 3    Subjective:  Pain Assessment: 6/10   Pt reports no significant changes in pain or function since previous treatment session. Pt notes she was sore after her first session however is feeling better. Notes she had numbness on her right thigh the past few days.         Objective:  ROM -  Not Tested   Strength - Therapeutic Exercises per flowsheet, Home program   Function: - Improved per pt report    Education:  Updated HEP, Verbal cues for ther ex, Manual cues for ther ex    Objective       Walking - Ilean Skill, Lat, March 6 Min Fwd/Back   4 Min Lateral   March 3 Min    Treadmill     Planks 10x10sec B    Side Planks 5x5sec B    Mini Squats 15x    HS Stretch 3x30sec B   Figure-4 Stretch 3x15sec B    Hip ABD/Flex/Ext 15x B    Standing in Flow    Bike Large Noodle 3 Min    Hang Large Noodle 5 Min          Treatment:  Ther Exercise per flowsheet    Assessment:   Patient tolerated session well. Added in new exercises without complaints noted.        Plan of Care:  Continue per Plan of care -  As written; Patient would benefit from skilled rehabilitation services to address the above impairments to restore functional capacity.    Thank you for referring this patient to [facility] Orthopaedics - Sports and Spine Rehabilitation    Ledell Peoples, ATC,PTA       Minutes   Time Based CPT  Physical Performance test, Therapeutic Exercise, Therapeutic Activities, NM Re-Education, Manual Therapy, Gait Training, Massage, Aquatic Therapy, Canalith Repositioning, Iontophoresis, Ultrasound, Orthotic fitting/training, Prosthetic fitting 4-Aqua   Service Based (untimed) CPT  PT/OT evaluation, PT/OT re-eval, E-stim-unattended, Mechanical  traction, Vasopneumatic device    Unbilled time (rest, etc)        Total Treatment Time 60

## 2020-09-13 ENCOUNTER — Ambulatory Visit: Payer: Medicaid (Managed Care)

## 2020-09-13 DIAGNOSIS — M5441 Lumbago with sciatica, right side: Secondary | ICD-10-CM

## 2020-09-13 DIAGNOSIS — G8929 Other chronic pain: Secondary | ICD-10-CM

## 2020-09-13 NOTE — Progress Notes (Signed)
[facility] Orthopedic Sports/Spine Rehabilitation  PT Treatment Note    Todays Date: 09/13/2020    Name: [name]  DOB: December 09, 1975  Referring Physician: Nanda Quinton, MD  Diagnosis:   1. Chronic right-sided low back pain with right-sided sciatica           Visit #: 4    Subjective:  Pain Assessment: 3/10   Pt reports an improvement in pain intensity and pain frequency since previous treatment session. Pt notes she feels she is making improvements. She notes she is standing a little bit taller and is more aware of her posture. She feels she is more aware of her muscles and what to tighten when she is in pain.        Objective:  ROM -  Not Tested   Strength - Therapeutic Exercises per flowsheet, Home program   Function: - Improved per pt report    Education:  Updated HEP, Verbal cues for ther ex, Manual cues for ther ex    Objective       Walking - Ilean Skill, Lat, March 7 Min Fwd/Back   4 Min Lateral   March 3 Min    Treadmill     Planks 10x10sec B    Side Planks 5x5sec B    Mini Squats 20x    HS Stretch 3x30sec B   Figure-4 Stretch 3x15sec B    Hip ABD/Flex/Ext 15x B    Standing in Flow    Bike Large Noodle 3 Min    Hang Large Noodle 5 Min          Treatment:  Ther Exercise per flowsheet    Assessment:   Patient tolerated session well. Added in new exercises without complaints noted.        Plan of Care:  Continue per Plan of care -  As written; Patient would benefit from skilled rehabilitation services to address the above impairments to restore functional capacity.    Thank you for referring this patient to [facility] Orthopaedics - Sports and Spine Rehabilitation    Ledell Peoples, ATC,PTA       Minutes   Time Based CPT  Physical Performance test, Therapeutic Exercise, Therapeutic Activities, NM Re-Education, Manual Therapy, Gait Training, Massage, Aquatic Therapy, Canalith Repositioning, Iontophoresis, Ultrasound, Orthotic fitting/training, Prosthetic fitting 4-Aqua   Service Based  (untimed) CPT  PT/OT evaluation, PT/OT re-eval, E-stim-unattended, Mechanical traction, Vasopneumatic device    Unbilled time (rest, etc)        Total Treatment Time 60

## 2020-09-20 ENCOUNTER — Ambulatory Visit: Payer: Medicaid (Managed Care) | Attending: Internal Medicine

## 2020-09-20 DIAGNOSIS — M545 Low back pain, unspecified: Secondary | ICD-10-CM | POA: Insufficient documentation

## 2020-09-20 DIAGNOSIS — G8929 Other chronic pain: Secondary | ICD-10-CM | POA: Insufficient documentation

## 2020-09-20 DIAGNOSIS — M5441 Lumbago with sciatica, right side: Secondary | ICD-10-CM | POA: Insufficient documentation

## 2020-09-20 NOTE — Progress Notes (Signed)
[facility] Orthopedic Sports/Spine Rehabilitation  PT Treatment Note    Todays Date: 09/20/2020    Name: [name]  DOB: [birth year]  Referring Physician: Nanda Quinton, MD  Diagnosis:   1. Chronic right-sided low back pain with right-sided sciatica           Visit #: 5    Subjective:  Pain Assessment: 6/10   Pt reports an improvement in pain intensity and pain frequency since previous treatment session. Pt notes her right lower back has been the most bothersome due to a pinched nerve. She notes she has been doing a lot of squats on land which has increased her pain. She notes she is standing taller.         Objective:  ROM -  Not Tested   Strength - Therapeutic Exercises per flowsheet, Home program   Function: - Improved per pt report    Education:  Updated HEP, Verbal cues for ther ex, Manual cues for ther ex    Objective       Walking - Ilean Skill, Lat, March 7 Min Fwd/Back   4 Min Lateral   March 3 Min    Treadmill     Planks 10x10sec B    Side Planks 5x5sec B    Mini Squats 15x    HS Stretch 3x30sec B   Figure-4 Stretch 3x15sec B    Hip ABD/Flex/Ext 15x B    Standing in Flow    Bike Large Noodle 3 Min    Hang Large Noodle 5 Min          Treatment:  Ther Exercise per flowsheet    Assessment:   Patient tolerated session well. Expresses pain relief post session.         Plan of Care:  Continue per Plan of care -  As written; Patient would benefit from skilled rehabilitation services to address the above impairments to restore functional capacity.    Thank you for referring this patient to [facility] Orthopaedics - Sports and Spine Rehabilitation    Ledell Peoples, ATC,PTA       Minutes   Time Based CPT  Physical Performance test, Therapeutic Exercise, Therapeutic Activities, NM Re-Education, Manual Therapy, Gait Training, Massage, Aquatic Therapy, Canalith Repositioning, Iontophoresis, Ultrasound, Orthotic fitting/training, Prosthetic fitting 4-Aqua   Service Based (untimed) CPT  PT/OT  evaluation, PT/OT re-eval, E-stim-unattended, Mechanical traction, Vasopneumatic device    Unbilled time (rest, etc)        Total Treatment Time 60

## 2020-09-21 ENCOUNTER — Encounter: Payer: Self-pay | Admitting: Emergency Medicine

## 2020-09-21 ENCOUNTER — Emergency Department: Payer: Medicaid (Managed Care)

## 2020-09-21 ENCOUNTER — Emergency Department
Admission: EM | Admit: 2020-09-21 | Discharge: 2020-09-21 | Disposition: A | Discharge status: Home / Self Care | Payer: Medicaid (Managed Care) | Source: Ambulatory Visit | Attending: Emergency Medicine | Admitting: Emergency Medicine

## 2020-09-21 DIAGNOSIS — M545 Low back pain, unspecified: Secondary | ICD-10-CM | POA: Insufficient documentation

## 2020-09-21 DIAGNOSIS — F17210 Nicotine dependence, cigarettes, uncomplicated: Secondary | ICD-10-CM | POA: Insufficient documentation

## 2020-09-21 DIAGNOSIS — M4316 Spondylolisthesis, lumbar region: Secondary | ICD-10-CM

## 2020-09-21 DIAGNOSIS — M4317 Spondylolisthesis, lumbosacral region: Secondary | ICD-10-CM

## 2020-09-21 DIAGNOSIS — M419 Scoliosis, unspecified: Secondary | ICD-10-CM

## 2020-09-21 NOTE — ED Provider Notes (Signed)
History     Chief Complaint   Patient presents with    Back Pain     Patient is a 44 yr old woman pmh htn,fibromyalgia,scoliosis  presents for left sided back pain. She was doing physical therapy exercises when felt sudden back pain. Moderate pain dull constant worse with movement.          Medical/Surgical/Family History     Past Medical History:   Diagnosis Date    Anemia     Anxiety 01/12/2016    Arthritis     Depression     DJD (degenerative joint disease), lumbar     small disc disease    DUB (dysfunctional uterine bleeding)     Fibromyalgia 01/12/2016    GERD (gastroesophageal reflux disease)     High blood pressure     Hypertension     Nicotine dependence 01/12/2016    Peptic ulcer     Sciatica of right side     Sleep apnea         Patient Active Problem List   Diagnosis Code    HTN (hypertension) I10    DJD (degenerative joint disease), lumbar M47.816    Chronic low back pain M54.50, G89.29    Sciatica of right side M54.31    Scoliosis of lumbar spine M41.9    Depression, unspecified depression type F32.A    Peptic ulcer K27.9    Anemia, unspecified type D64.9    DUB (dysfunctional uterine bleeding) N93.8    Fibromyalgia M79.7    Migraines G43.909    Anxiety F41.9    Nicotine dependence F17.200    OSA (obstructive sleep apnea) G47.33    Acute lateral meniscus tear of left knee S83.282A    Acne vulgaris L70.0            Past Surgical History:   Procedure Laterality Date    ANTERIOR CRUCIATE LIGAMENT REPAIR Right 01/24/2015    CESAREAN SECTION, LOW TRANSVERSE  2007    CHOLECYSTECTOMY  1996    COLONOSCOPY      PR ARTHRS KNE SURG W/MENISCECTOMY MED/LAT W/SHVG Left 09/11/2016    Procedure: LEFT KNEE ARTHROSCOPY WITH PARTIAL LATERAL MENISCECTOMY, CHONDROPLASTY    ;  Surgeon: Arvil Persons, MD;  Location: [facility];  Service: Orthopedics    PR KNEE SCOPE,DIAGNOSTIC Right 08/17/2018    Procedure: KNEE ARTHROSCOPY DIAGNOSTIC;  Surgeon: Arvil Persons, MD;  Location:  [facility];  Service: Orthopedics    PR KNEE SCOPE,PART SYNOVECT Right 08/17/2018    Procedure: KNEE ARTHROSCOPY WITH SYNOVECTOMY;  Surgeon: Arvil Persons, MD;  Location: [facility];  Service: Orthopedics    PR KNEE SCOPE,SHAVE ARTICULAR CART Right 08/17/2018    Procedure: RIGHT KNEE ARTHROSCOPY WITH CHONDROPLASTY;  Surgeon: Arvil Persons, MD;  Location: [facility];  Service: Orthopedics     Family History   Problem Relation Age of Onset    Brain cancer Mother     Colon cancer Father 2    Asthma Brother           Social History     Tobacco Use    Smoking status: Current Every Day Smoker     Packs/day: 0.50    Smokeless tobacco: Never Used   Substance Use Topics    Alcohol use: No    Drug use: No     Living Situation     Questions Responses    Patient lives with     Homeless     Caregiver for  other family member     External Services     Employment     Domestic Violence Risk                 Review of Systems   Review of Systems   Constitutional: Positive for activity change.   Musculoskeletal: Positive for back pain. Negative for arthralgias.   Skin: Negative for rash.   Neurological: Negative for dizziness.   Psychiatric/Behavioral: Negative for agitation.       Physical Exam     Triage Vitals  Triage Start: Start, (09/21/20 1253)   First Recorded BP: (!) 175/105, Resp: 24, Temp: 37.3 C (99.1 F), Temp src: TEMPORAL Oxygen Therapy SpO2: 97 %, Oximetry Source: Lt Hand, O2 Device: None (Room air), Heart Rate: 100, (09/21/20 1255)  .  First Pain Reported  0-10 Scale: 8, Pain Location/Orientation: (S) Back, Pain Descriptors: Aching, (09/21/20 1255)       Physical Exam  Vitals and nursing note reviewed.   Constitutional:       Appearance: She is well-developed.   HENT:      Head: Normocephalic and atraumatic.      Nose: Nose normal.      Mouth/Throat:      Mouth: Mucous membranes are moist.   Eyes:      Conjunctiva/sclera: Conjunctivae normal.      Pupils: Pupils are equal, round, and reactive to  light.   Cardiovascular:      Rate and Rhythm: Normal rate and regular rhythm.      Heart sounds: Normal heart sounds.   Pulmonary:      Effort: Pulmonary effort is normal.      Breath sounds: Normal breath sounds.   Abdominal:      General: Bowel sounds are normal.      Palpations: Abdomen is soft.   Musculoskeletal:         General: Normal range of motion.      Cervical back: Normal range of motion and neck supple.      Comments: Curvature spine, nontender spine, mild paraspinal tenderness   Skin:     General: Skin is warm and dry.      Capillary Refill: Capillary refill takes less than 2 seconds.   Neurological:      Mental Status: She is alert and oriented to person, place, and time.   Psychiatric:         Mood and Affect: Mood normal.         Medical Decision Making   Patient seen by me on:  09/21/2020    Assessment:  44 yr old woman pmh scoliosis,htn presents with left sided back pain    Differential diagnosis:  Spine fx,scoliosis,sprain,spinal stenosis    Plan:  Xray lumbar spine    Independent review of: XRays, chart/prior records    ED Course and Disposition:  Xray lumbar spine : Moderate dextroscoliotic curvature of the thoracolumbar spine. This is similar to the prior study. Mild L5-S1 retrolisthesis. Subtle marginal spurring. No acute findings.   Given return precautions, pt appreciative.            Verlin Grills, MD          Verlin Grills, MD  09/21/20 [phone]

## 2020-09-21 NOTE — ED Triage Notes (Signed)
Coming from home. Hx of scoliosis, was doing exercise when she heard a pop and developed L sided back pain

## 2020-09-21 NOTE — ED Notes (Signed)
AVS reviewed. Pt verbalized understanding. VSS. Pt provided medicab form. Will wait in waiting room for ride home.

## 2020-09-26 ENCOUNTER — Other Ambulatory Visit: Payer: Self-pay

## 2020-09-28 ENCOUNTER — Ambulatory Visit: Payer: Medicaid (Managed Care) | Admitting: Rehabilitative and Restorative Service Providers"

## 2020-10-09 ENCOUNTER — Ambulatory Visit: Payer: Medicaid (Managed Care) | Admitting: Rehabilitative and Restorative Service Providers"

## 2020-10-09 ENCOUNTER — Other Ambulatory Visit: Payer: Self-pay

## 2020-10-09 DIAGNOSIS — G8929 Other chronic pain: Secondary | ICD-10-CM

## 2020-10-09 DIAGNOSIS — M5441 Lumbago with sciatica, right side: Secondary | ICD-10-CM

## 2020-10-09 NOTE — Progress Notes (Signed)
[facility] SPINE THERAPY  LUMBAR SPINE STATUS    10/09/2020  Diagnosis:   1. Chronic right-sided low back pain with right-sided sciatica       Onset date: Chronic (12+wks)      SUBJECTIVE    [name] is a 44 y.o. female who is present today for lumbar care.  States she has found aquatic therapy beneficial for her posture and feels she is getting stronger.  She did note she was attempting one of her land exercises and states she did it wrong and it irritated her back so she went to the ED to get it checked out.  X-rays showed no changes but she still has some discomfort from that.  Mechanism of injury/history of symptoms: No specific cause    Nature of Symptoms  Relevant symptoms: Aching, Burning, Pain    Symptom frequency: Constant  Symptom intensity (0 - 10 scale): Now 7 Best 4 Worst 9  Symptom location: Posterior and Central bilateral low back R>L  Symptoms worsen with: Lifting, Standing, Walking   Symptoms improve with: Heat, Aqua therapy, laying on right side, TENS   Prior history of low back pain.  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        OBJECTIVE    Observation  Patient was able to  ambulate into the department with a antalgic gait pattern.      Posture    Lateral Deviation: Lateral trunk shift due to scoliosis    Palpation   Generalized Lumbar Pain    Segmental Mobility Assessment  Hypomobile    LUMBAR AROM / Movement pattern  Flexion: Reach to to feet with normal movement, right rib hump present  Extension:  significant loss with painful arc  Left Sidebend: Reach to proximal knee with painful arc  Right Sidebend: Reach to mid thigh with painful arc  Left Rotation:  minor loss with normal movement  Right Rotation:  moderate loss with normal movement  Hip ROM: WNL  Hip strength: grossly 4/5      Neuromuscular Assessment  Sensation:  LE  Light touch, Intact to gross screen  Myotomes:  LE Intact to gross screen  Heel walking: normal  Toe Walking: normal      Special Tests  Lumbar:  Slump, Right LE  negative, Left LE   negative  Straight Leg Raise,  Right LE  negative, Left LE   negative  Hip: FADIR,  negative  FABER,  negative  Hip Scouring,  negative        Flexibility:   Hamstrings: WNL  Quads: Moderate tightness  Psoas: Moderate tightness  Neuromuscular Control  Isometric: Ability to brace abdomen in sitting/supine -  Abdominal Activation: yes  Isometric: Ability to contract gluteal muscles in siiting/supine -  Glute activation:  yes    Mobility / Movement  Squat:  to chair, decreased posterior hip hinge  Single leg stance:  LEFT: Duration 5-10 seconds / positive trendelenburg / Postural sway, minimal, RIGHT: Duration 5-10 seconds /  positive trendelenburg / Postural sway,  minimal           Directional Preference Testing  Directional Preference: Neutral     Oswestry: 58% (was 62%) (improved)              ASSESSMENT  Findings consistent with 44 y.o. female with lower back pain with scoliosis and right sided sciatica  with pain, ROM limitations, strength limitations, functional limitations.  Pt reports subjective relief with aquatic exercises; she did experience a set back and demonstrates  limited functional changes today but will trial another round of aquatic exercise.  Significant presentation and prognostic factors include moderate symptom rating, low disability rating, and stable clinical status.   Based on clinical evaluation and assessment, the primary rehabilitation approach will include symptom modulation, movement control, functional optimization.  Patient would benefit from skilled aquatic therapy to address identified impairments, functional limitations, and pain. Patient currently unable to tolerate land-based intervention and would benefit from therapy in the aquatic environment for greater mobility, decreased joint compression and reduced weight bearing forces. Will transition patient to land-based therapy or independent community program as appropriate.            Personal factors affecting treatment/recovery:   none identified  Comorbidities affecting treatment/recovery:   Scoliosis  Clinical presentation:   stable  Patient complexity:     moderate level as indicated by above stability of condition, personal factors, environmental factors and comorbidities in addition to patient symptom presentation and impairments found on physical exam.    Prognosis:  Fair    Contraindications/Precautions/Limitation:  Per diagnosis    Short Term Goals:2week(s) Decrease c/o max pain to < 4/10 and Minimal assistance with HEP/ education concepts (progress)  Long Term Goals:6week(s) Walk community distances without increased symptoms, Stand as long as needed when completing ADL's without increased symptoms, Sit as long as needed with ADL's without increased symptoms, Ambulate stairs up and down without increased symptoms, Drive automobile without increased symptoms, Transfer from sit to stand and perform bed mobility without increased symptoms, Patient demonstrates improved functional core strength, Patient will return to full prior level of function  , Independent with symptom management  (progress)    TREATMENT PLAN  Patient/family involved in developing goals and treatment plan: Yes    Recommended Treatment Freq 1-2 times per weeks/months for 6 week(s)    Treatment plan inclusive of:  Exercise:AROM, AAROM, PROM, Stretching, Strengthening, Progressive Resistive, Coordination, Aerobic exercise  Manual Techniques:Joint mobilization, Soft tissue release/massage   Modalities:  Cold pack, Functional/Therapeutic activites per flowsheet, Moist heat, Neuromuscular Re-ed,  Activity as indicated, Ther Exercise per flowsheet  Functional: Proprioception/Dynamic stability, Functional rehab      Thank you for the referral of this patient to [facility].    Juliette Alcide, PT     Minutes   Time Based CPT  Physical Performance test, Therapeutic Exercise, Therapeutic Activities, NM Re-Education, Manual Therapy, Gait Training, Massage, Aquatic Therapy, Canalith Repositioning, Iontophoresis, Ultrasound, Orthotic fitting/training, Prosthetic fitting 10 min   Service Based (untimed) CPT  PT/OT evaluation, PT/OT re-eval, E-stim-unattended, Mechanical traction, Vasopneumatic device  Unbilled time (rest, etc) 10 min       Total Treatment Time 40 min           Bridge 15x   Curl up 2 x 10   Hip flexor stretch 5 x 15"   Side glide 10 x 5"                       Moist heat 10 min

## 2020-10-18 ENCOUNTER — Ambulatory Visit: Payer: Medicaid (Managed Care)

## 2020-10-23 ENCOUNTER — Ambulatory Visit: Payer: Medicaid (Managed Care) | Admitting: Rehabilitative and Restorative Service Providers"

## 2020-10-25 ENCOUNTER — Ambulatory Visit: Payer: Medicaid (Managed Care)

## 2020-10-28 ENCOUNTER — Ambulatory Visit: Payer: Medicaid (Managed Care) | Attending: General Practice

## 2020-10-28 DIAGNOSIS — G8929 Other chronic pain: Secondary | ICD-10-CM | POA: Insufficient documentation

## 2020-10-28 DIAGNOSIS — M5441 Lumbago with sciatica, right side: Secondary | ICD-10-CM | POA: Insufficient documentation

## 2020-10-28 DIAGNOSIS — M545 Low back pain, unspecified: Secondary | ICD-10-CM | POA: Insufficient documentation

## 2020-10-28 NOTE — Progress Notes (Signed)
[facility] Orthopedic Sports/Spine Rehabilitation  PT Treatment Note    Todays Date: 10/28/2020    Name: [name]  DOB: 1976-02-10  Referring Physician: No ref. provider found  Diagnosis:   1. Chronic right-sided low back pain with right-sided sciatica           Visit #: 7    Subjective:  Pain Assessment: 7/10   Pt reports an improvement in pain intensity and pain frequency since previous treatment session. Pt notes she had some setbacks, she went to the ER due to increased pain and she found out she was performing her HEP wrong. Notes she has received her status from her land therapy since then.          Objective:  ROM -  Not Tested   Strength - Therapeutic Exercises per flowsheet, Home program   Function: - Improved per pt report    Education:  Updated HEP, Verbal cues for ther ex, Manual cues for ther ex    Objective       Walking - Ilean Skill, Lat, March 7 Min Fwd/Back   4 Min Lateral   March 3 Min    Treadmill     Planks 10x10sec B    Side Planks 5x5sec B    Mini Squats 20x    HS Stretch 3x30sec B   Figure-4 Stretch 3x15sec B    Hip ABD/Flex/Ext 15x B    Standing in Flow    Bike Large Noodle 3 Min    Hang Large Noodle 5 Min          Treatment:  Ther Exercise per flowsheet    Assessment:   Patient tolerated session well.        Plan of Care:  Continue per Plan of care -  As written; Patient would benefit from skilled rehabilitation services to address the above impairments to restore functional capacity.    Thank you for referring this patient to [facility] Orthopaedics - Sports and Spine Rehabilitation    Ledell Peoples, ATC,PTA       Minutes   Time Based CPT  Physical Performance test, Therapeutic Exercise, Therapeutic Activities, NM Re-Education, Manual Therapy, Gait Training, Massage, Aquatic Therapy, Canalith Repositioning, Iontophoresis, Ultrasound, Orthotic fitting/training, Prosthetic fitting 4-Aqua   Service Based (untimed) CPT  PT/OT evaluation, PT/OT re-eval,  E-stim-unattended, Mechanical traction, Vasopneumatic device    Unbilled time (rest, etc)        Total Treatment Time 60

## 2020-11-01 ENCOUNTER — Ambulatory Visit: Payer: Medicaid (Managed Care)

## 2020-11-08 ENCOUNTER — Ambulatory Visit: Payer: Medicaid (Managed Care)

## 2020-11-08 DIAGNOSIS — M5441 Lumbago with sciatica, right side: Secondary | ICD-10-CM

## 2020-11-08 DIAGNOSIS — G8929 Other chronic pain: Secondary | ICD-10-CM
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
## 2020-11-13 ENCOUNTER — Ambulatory Visit
Payer: Medicaid (Managed Care) | Attending: General Practice | Admitting: Rehabilitative and Restorative Service Providers"

## 2020-11-13 DIAGNOSIS — G8929 Other chronic pain: Secondary | ICD-10-CM | POA: Insufficient documentation

## 2020-11-13 DIAGNOSIS — M5441 Lumbago with sciatica, right side: Secondary | ICD-10-CM | POA: Insufficient documentation

## 2020-11-13 NOTE — Progress Notes (Signed)
[facility] SPINE THERAPY  LUMBAR SPINE STATUS    11/13/2020  Diagnosis:   1. Chronic right-sided low back pain with right-sided sciatica       Onset date: Chronic (12+wks)      SUBJECTIVE    [name] is a 45 y.o. female who is present today for lumbar care.  States she finds aquatic therapy beneificial; she feels ready to start some land based therapy to work on her mobility and muscle endurance.  Mechanism of injury/history of symptoms: No specific cause    Nature of Symptoms  Relevant symptoms: Aching, Burning, Pain    Symptom frequency: Constant  Symptom intensity (0 - 10 scale): Now 7 Best 4 Worst 9  Symptom location: Posterior and Central bilateral low back R>L  Symptoms worsen with: Lifting, Standing, Walking   Symptoms improve with: Heat, Aqua therapy, laying on right side, TENS   Prior history of low back pain.  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        OBJECTIVE    Observation  Patient was able to ambulate into the department with a antalgic gait pattern.      Posture    Lateral Deviation: Lateral trunk shift due to scoliosis    Palpation   Generalized Lumbar Pain    Segmental  Mobility Assessment  Hypomobile    LUMBAR AROM / Movement pattern  Flexion: Reach to to feet with normal movement, right rib hump present  Extension:  moderate loss with end range pain improved  Left Sidebend: Reach to proximal knee with painful arc  Right Sidebend: Reach to proximal knee with painful arc  Left Rotation:  minor loss with normal movement  Right Rotation:  moderate loss with normal movement  Hip ROM: WNL  Hip strength: grossly 4+/5      Neuromuscular Assessment  Sensation:  LE  Light touch, Intact to gross screen  Myotomes:  LE Intact to gross screen  Heel walking: normal  Toe Walking: normal      Special Tests  Lumbar:  Slump, Right LE  negative, Left LE   negative  Straight Leg Raise,  Right LE  negative, Left LE   negative  Hip: FADIR,  negative  FABER,  negative  Hip Scouring,  negative        Flexibility:   Hamstrings: WNL  Quads: Moderate tightness  Psoas: Moderate tightness    Neuromuscular Control  Isometric: Ability to brace abdomen in sitting/supine -  Abdominal Activation: yes  Isometric: Ability to contract gluteal muscles in siiting/supine -  Glute activation:  yes    Mobility / Movement  Squat:  to chair, decreased posterior hip hinge  Single leg stance:  LEFT: Duration 5-10 seconds / positive trendelenburg / Postural sway, minimal, RIGHT: Duration 5-10 seconds /  positive trendelenburg / Postural sway,  minimal           Directional Preference Testing  Directional Preference: Neutral     Oswestry: 66% (was 58%)              ASSESSMENT  Findings consistent with 45 y.o. female with lower back pain with scoliosis and right sided sciatica  with pain, ROM limitations, strength limitations, functional limitations.  Will start to transition patient between land and aquatic therapy.    Significant presentation and prognostic factors include moderate symptom rating, low disability rating, and stable clinical status.   Based on clinical evaluation and assessment, the primary rehabilitation  approach will include symptom modulation, movement control, functional optimization.  Patient would benefit from skilled aquatic therapy to address identified impairments, functional limitations, and pain. Patient currently unable to tolerate land-based intervention and would benefit from therapy in the aquatic environment for greater mobility, decreased joint compression and reduced weight bearing forces. Will transition patient to land-based therapy or independent community program as appropriate.            Personal factors affecting treatment/recovery:   none identified  Comorbidities affecting treatment/recovery:   Scoliosis  Clinical presentation:   stable  Patient complexity:     moderate level as indicated by above stability of condition, personal factors, environmental factors and comorbidities in addition to patient symptom presentation and impairments found on physical exam.    Prognosis:  Fair    Contraindications/Precautions/Limitation:  Per diagnosis    Short Term Goals:2week(s) Decrease c/o max pain to < 4/10 and Minimal assistance with HEP/ education concepts (progress)  Long Term Goals:6week(s) Walk community distances without increased symptoms, Stand as long as needed when completing ADL's without increased symptoms, Sit as long as needed with ADL's without increased symptoms, Ambulate stairs up and down without increased symptoms, Drive automobile without increased symptoms, Transfer from sit to stand and perform bed mobility without increased symptoms, Patient demonstrates improved functional core strength, Patient will return to full prior level of function  , Independent with symptom management (progress)    TREATMENT PLAN  Patient/family involved in developing goals and treatment plan: Yes    Recommended Treatment Freq 1-2 times per weeks/months for 6 week(s)    Treatment plan inclusive of:  Exercise:AROM, AAROM, PROM, Stretching, Strengthening, Progressive Resistive, Coordination,  Aerobic exercise  Manual Techniques:Joint mobilization, Soft tissue release/massage   Modalities:  Cold pack, Functional/Therapeutic activites per flowsheet, Moist heat, Neuromuscular Re-ed,  Activity as indicated, Ther Exercise per flowsheet  Functional: Proprioception/Dynamic stability, Functional rehab      Thank you for the referral of this patient to [facility].    Juliette Alcide, PT     Minutes   Time Based CPT  Physical Performance test, Therapeutic Exercise, Therapeutic Activities, NM Re-Education, Manual Therapy, Gait Training, Massage, Aquatic Therapy, Canalith Repositioning, Iontophoresis, Ultrasound, Orthotic fitting/training, Prosthetic fitting 10 min   Service Based (untimed) CPT  PT/OT evaluation, PT/OT re-eval, E-stim-unattended, Mechanical traction, Vasopneumatic device   Unbilled time (rest, etc) 10 min       Total Treatment Time 40 min       Bike 5 min added   Bridge 15x   Curl up 2 x 10   Hip flexor stretch 5 x 15"   Side glide 10 x 5"  Ball release to paraspinals 2 min  added                   Moist heat 10 min

## 2020-11-22 ENCOUNTER — Ambulatory Visit: Payer: Medicaid (Managed Care) | Attending: General Practice

## 2020-11-22 DIAGNOSIS — G8929 Other chronic pain: Secondary | ICD-10-CM | POA: Insufficient documentation

## 2020-11-22 DIAGNOSIS — M545 Low back pain, unspecified: Secondary | ICD-10-CM | POA: Insufficient documentation

## 2020-11-22 DIAGNOSIS — M5441 Lumbago with sciatica, right side: Secondary | ICD-10-CM | POA: Insufficient documentation
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
## 2020-11-27 ENCOUNTER — Ambulatory Visit: Payer: Medicaid (Managed Care) | Admitting: Rehabilitative and Restorative Service Providers"

## 2020-11-27 DIAGNOSIS — M5441 Lumbago with sciatica, right side: Secondary | ICD-10-CM

## 2020-11-27 DIAGNOSIS — G8929 Other chronic pain: Secondary | ICD-10-CM

## 2020-11-27 NOTE — Progress Notes (Signed)
[facility] Orthopedic Sports/Spine Rehabilitation  PT Treatment Note    Todays Date: 11/27/2020    Name: [name]  DOB: 24-Nov-1975  Referring Physician: Leola Brazil, MD  Diagnosis:   1. Chronic right-sided low back pain with right-sided sciatica         Visit #: 11    Subjective:  Pain Assessment: DNQ  Pt reports an improvement in ability to perform standing up stronger activities since prior treatment session.       Objective:  ROM -  Lumbar Spine  Strength - Therapeutic Exercises per flowsheet  Function: - Improved  Education:  Updated HEP, Verbal cues for ther ex, Manual cues for ther ex      Treatment:  Ther Exercise per flowsheet    Assessment:   We reviewed home exercises to ensure proper technique. Tolerated exercises well, moist heat provided at end visit.       Plan of Care:  Continue per Plan of care -  As written; Patient would benefit from skilled rehabilitation services to address the above impairments to restore functional capacity.    Thank you for referring this patient to [facility] Orthopaedics - Sports and Spine Rehabilitation    Juliette Alcide, PT       Minutes   Time Based CPT  Physical Performance test, Therapeutic Exercise, Therapeutic Activities, NM Re-Education, Manual Therapy, Gait Training, Massage, Aquatic Therapy, Canalith Repositioning, Iontophoresis, Ultrasound, Orthotic fitting/training, Prosthetic fitting 40 min   Service Based (untimed) CPT  PT/OT evaluation, PT/OT re-eval, E-stim-unattended, Mechanical traction, Vasopneumatic device    Unbilled time (rest, etc) 10 min       Total Treatment Time 50 min     Bike 10 min    Bridge 2 x 10   Curl up 2 x 10   Side plank knees 6 x 10"   Hip flexor stretch 3 x 30"   Side glide 10 x 5"   Childs pose  5 x 10"   Ball release to paraspinals    Supine march 10x               Moist heat 10 min

## 2020-12-06 ENCOUNTER — Ambulatory Visit: Payer: Medicaid (Managed Care)

## 2020-12-09 ENCOUNTER — Ambulatory Visit: Payer: Medicaid (Managed Care)

## 2020-12-11 ENCOUNTER — Ambulatory Visit: Payer: Medicaid (Managed Care) | Admitting: Rehabilitative and Restorative Service Providers"

## 2020-12-11 DIAGNOSIS — G8929 Other chronic pain: Secondary | ICD-10-CM
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
## 2020-12-19 ENCOUNTER — Other Ambulatory Visit: Payer: Self-pay

## 2020-12-27 ENCOUNTER — Other Ambulatory Visit: Payer: Self-pay

## 2021-01-02 ENCOUNTER — Ambulatory Visit
Payer: Medicaid (Managed Care) | Attending: General Practice | Admitting: Rehabilitative and Restorative Service Providers"

## 2021-01-02 DIAGNOSIS — M545 Low back pain, unspecified: Secondary | ICD-10-CM | POA: Insufficient documentation

## 2021-01-02 DIAGNOSIS — M5441 Lumbago with sciatica, right side: Secondary | ICD-10-CM | POA: Insufficient documentation

## 2021-01-02 DIAGNOSIS — G8929 Other chronic pain: Secondary | ICD-10-CM | POA: Insufficient documentation

## 2021-01-02 NOTE — Progress Notes (Signed)
[facility] Orthopedic Sports/Spine Rehabilitation  PT Treatment Note    Todays Date: 01/02/2021    Name: [name]  DOB: [birth year]  Referring Physician: Leola Brazil, MD  Diagnosis:   1. Chronic right-sided low back pain with right-sided sciatica         Visit #: 13    Subjective:  Pain Assessment: 7 "burning"  Pt reports no significant changes in pain or function since previous treatment session.  States she was fairly sore in her legs after last session.  Also reports that she has noticed increased right sided back/rib discomfort when she mops at home.  The variations of pain worries her        Objective:  ROM -  Lumbar Spine  Strength - Therapeutic Exercises per flowsheet  Function: - Improved  Education:  Updated HEP, Verbal cues for ther ex, Manual cues for ther ex      Treatment:  Ther Exercise per flowsheet    Assessment:   Modified duration of bike due to reports of soreness in legs.  Attempted to progress core stability routine as listed below to improve muscle endurance.  Tolerated this well.       Plan of Care:  Continue per Plan of care -  As written; Patient would benefit from skilled rehabilitation services to address the above impairments to restore functional capacity.    Thank you for referring this patient to [facility] Orthopaedics - Sports and Spine Rehabilitation    Juliette Alcide, PT       Minutes   Time Based CPT  Physical Performance test, Therapeutic Exercise, Therapeutic Activities, NM Re-Education, Manual Therapy, Gait Training, Massage, Aquatic Therapy, Canalith Repositioning, Iontophoresis, Ultrasound, Orthotic fitting/training, Prosthetic fitting 40 min   Service Based (untimed) CPT  PT/OT evaluation, PT/OT re-eval, E-stim-unattended, Mechanical traction, Vasopneumatic device    Unbilled time (rest, etc) 10 min       Total Treatment Time 50 min     Bike 5 min    Diaphragmatic breathing 10x   Bridge 6 x 10"   Curl up 2 x 5 2#   Side plank knees 6 x 10"   Hip  flexor stretch 3 x 30"   Side glide 10 x 5"   Childs pose  5 x 10"   Ball release to paraspinals    Supine march 2 x 10               Moist heat 10 min

## 2021-01-05 ENCOUNTER — Other Ambulatory Visit: Payer: Self-pay

## 2021-01-08 ENCOUNTER — Ambulatory Visit: Payer: Medicaid (Managed Care) | Admitting: Rehabilitative and Restorative Service Providers"

## 2021-01-08 DIAGNOSIS — G8929 Other chronic pain: Secondary | ICD-10-CM

## 2021-01-08 DIAGNOSIS — M5441 Lumbago with sciatica, right side: Secondary | ICD-10-CM

## 2021-01-08 NOTE — Progress Notes (Signed)
[facility] Orthopedic Sports/Spine Rehabilitation  PT Treatment Note    Todays Date: 01/08/2021    Name: [name]  DOB: January 15, 1976  Referring Physician: Leola Brazil, MD  Diagnosis:   1. 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  States she she feels the breathing exercise has helped her manage her pain a bit better. She feels she can relax the spot on her back that sticks out.  She was able       Objective:  ROM -  Lumbar Spine  Strength - Therapeutic Exercises per flowsheet  Function: - Improved  Education:  Updated HEP, Verbal cues for ther ex, Manual cues for ther ex      Treatment:  Ther Exercise per flowsheet    Assessment:   Continued with current routine, attempted to add paloff press to progress rotational stabilization.       Plan of Care:  Continue per Plan of care -  As written; Patient would benefit from skilled rehabilitation services to address the above impairments to restore functional capacity.    Thank you for referring this patient to [facility] Orthopaedics - Sports and Spine Rehabilitation    Juliette Alcide, PT       Minutes   Time Based CPT  Physical Performance test, Therapeutic Exercise, Therapeutic Activities, NM Re-Education, Manual Therapy, Gait Training, Massage, Aquatic Therapy, Canalith Repositioning, Iontophoresis, Ultrasound, Orthotic fitting/training, Prosthetic fitting 35 min   Service Based (untimed) CPT  PT/OT evaluation, PT/OT re-eval, E-stim-unattended, Mechanical traction, Vasopneumatic device    Unbilled time (rest, etc) 10 min       Total Treatment Time 45 min     Bike 8 min    Diaphragmatic breathing 10x   Bridge 6 x 10"   Curl up 2 x 5    Side plank knees, hip abd 3 x 10"   Hip flexor stretch 3 x 30"   Side glide 10 x 5"   Childs pose  5 x 10"   Paloff Press Blue 10 x 5" B   Supine  march 2 x 10               Moist heat 10 min

## 2021-01-09 ENCOUNTER — Other Ambulatory Visit
Admission: RE | Admit: 2021-01-09 | Discharge: 2021-01-09 | Disposition: A | Discharge status: Home / Self Care | Payer: Medicaid (Managed Care) | Source: Ambulatory Visit | Attending: Internal Medicine | Admitting: Internal Medicine

## 2021-01-09 ENCOUNTER — Other Ambulatory Visit: Payer: Self-pay | Admitting: Gastroenterology

## 2021-01-09 DIAGNOSIS — M8580 Other specified disorders of bone density and structure, unspecified site: Secondary | ICD-10-CM | POA: Insufficient documentation

## 2021-01-09 DIAGNOSIS — M810 Age-related osteoporosis without current pathological fracture: Secondary | ICD-10-CM

## 2021-01-09 LAB — COMPREHENSIVE METABOLIC PANEL
ALT: 10 U/L (ref 0–35)
AST: 25 U/L (ref 0–35)
Albumin: 4.4 g/dL (ref 3.5–5.2)
Alk Phos: 54 U/L (ref 35–105)
Anion Gap: 13 (ref 7–16)
Bilirubin,Total: <0.2 mg/dL (ref 0.0–1.2)
CO2: 25 mmol/L (ref 20–28)
Calcium: 9.2 mg/dL (ref 8.6–10.2)
Chloride: 103 mmol/L (ref 96–108)
Creatinine: 0.62 mg/dL (ref 0.51–0.95)
Glucose: 96 mg/dL (ref 60–99)
Lab: 13 mg/dL (ref 6–20)
Potassium: 3.9 mmol/L (ref 3.3–5.1)
Sodium: 141 mmol/L (ref 133–145)
Total Protein: 7.2 g/dL (ref 6.3–7.7)
eGFR BY CREAT: 111 *

## 2021-01-09 LAB — VITAMIN D: 25-OH Vit Total: 20 ng/mL — ABNORMAL LOW (ref 30–60)

## 2021-01-15 ENCOUNTER — Ambulatory Visit: Payer: Medicaid (Managed Care) | Admitting: Rehabilitative and Restorative Service Providers"

## 2021-02-10 ENCOUNTER — Ambulatory Visit
Payer: Medicaid (Managed Care) | Attending: General Practice | Admitting: Rehabilitative and Restorative Service Providers"

## 2021-02-10 DIAGNOSIS — G8929 Other chronic pain: Secondary | ICD-10-CM | POA: Insufficient documentation

## 2021-02-10 DIAGNOSIS — M545 Low back pain, unspecified: Secondary | ICD-10-CM | POA: Insufficient documentation

## 2021-02-10 DIAGNOSIS — M5441 Lumbago with sciatica, right side: Secondary | ICD-10-CM | POA: Insufficient documentation

## 2021-02-10 NOTE — Progress Notes (Signed)
[facility] Orthopedic Sports/Spine Rehabilitation  PT Treatment Note    Todays Date: 02/10/2021    Name: [name]  DOB: October 25, 1975  Referring Physician: Leola Brazil, MD  Diagnosis:   1. Chronic right-sided low back pain with right-sided sciatica         Visit #: 15    Subjective:  Pain Assessment: 6   Pt reports no significant changes in pain or function since previous treatment session.  States she has been doing her exercises consistently.  She missed a few weeks due to having a tough time getting into therapy.  She has been wearing a back brace which has been helping her feel more stability during the day.     Objective:  ROM -  Lumbar Spine  Strength - Therapeutic Exercises per flowsheet  Function: - Improved  Education:  Updated HEP, Verbal cues for ther ex, Manual cues for ther ex      Treatment:  Ther Exercise per flowsheet, moist heat    Assessment:   We reviewed routine to ensure proper technique.  Patient tolerates routine and demonstrates near independence with routine.  Will start to incorporate more functional based core exercises.       Plan of Care:  Continue per Plan of care -  As written; Patient would benefit from skilled rehabilitation services to address the above impairments to restore functional capacity.    Thank you for referring this patient to [facility] Orthopaedics - Sports and Spine Rehabilitation    Juliette Alcide, PT       Minutes   Time Based CPT  Physical Performance test, Therapeutic Exercise, Therapeutic Activities, NM Re-Education, Manual Therapy, Gait Training, Massage, Aquatic Therapy, Canalith Repositioning, Iontophoresis, Ultrasound, Orthotic fitting/training, Prosthetic fitting 40 min   Service Based (untimed) CPT  PT/OT evaluation, PT/OT re-eval, E-stim-unattended, Mechanical traction, Vasopneumatic device    Unbilled time (rest, etc) 10 min       Total Treatment Time 50 min     Bike 8 min    Diaphragmatic breathing 10x   Bridge 6 x 10"   Curl up  2 x 5    Side plank knees, hip abd 5 x 10" B   Hip flexor stretch 3 x 30"   Side glide 10 x 5"   Childs pose  5 x 10"   Paloff Press Blue 10 x 5" B   Supine march 2 x 10       Suitcase carry -->           Moist heat 10 min

## 2021-02-19 ENCOUNTER — Ambulatory Visit
Payer: Medicaid (Managed Care) | Attending: General Practice | Admitting: Rehabilitative and Restorative Service Providers"

## 2021-02-19 DIAGNOSIS — M545 Low back pain, unspecified: Secondary | ICD-10-CM | POA: Insufficient documentation

## 2021-02-19 DIAGNOSIS — G8929 Other chronic pain: Secondary | ICD-10-CM | POA: Insufficient documentation

## 2021-02-19 DIAGNOSIS — M5441 Lumbago with sciatica, right side: Secondary | ICD-10-CM | POA: Insufficient documentation

## 2021-02-19 NOTE — Progress Notes (Signed)
[facility] SPINE THERAPY  LUMBAR SPINE STATUS    02/19/2021  Diagnosis:   1. Chronic right-sided low back pain with right-sided sciatica       Onset date: Chronic (12+wks)      SUBJECTIVE    [name] is a 45 y.o. female who is present today for lumbar care.    Patient feels she has improved with her posture, less intense pain less frequently and finds using her breathing exercise improves her pain.  She has been consistent with her exercises.  She is able to walk a bit further before needing to stop.      Mechanism of injury/history of symptoms: No specific cause    Nature of Symptoms  Relevant symptoms: Aching, Burning, Pain    Symptom frequency: Constant  Symptom intensity (0 - 10 scale): Now 5 Best 3 Worst 8  Symptom location: Posterior and Central bilateral low back R>L  Symptoms worsen with: Lifting, Standing, Walking   Symptoms improve with: Heat, Aqua therapy, laying on right side, TENS   Prior history of low back pain.  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        OBJECTIVE    Observation  Patient was able to ambulate into the department with an antalgic gait pattern.      Posture    Lateral Deviation: Lateral trunk shift due to scoliosis    Palpation   right lumbar paraspinal spasm    Segmental Mobility Assessment  Hypomobile    LUMBAR AROM / Movement pattern  Flexion: Reach to to feet with normal movement, right rib hump present  Extension:  moderate loss with end range pain improved  Left Sidebend: Reach to distal knee with normal movement  Right Sidebend: Reach to  distal knee with painful arc  Left Rotation:  minor loss with normal movement  Right Rotation:  minor loss with normal movement  Hip ROM: WNL  Hip strength: grossly 4+/5      Neuromuscular Assessment  Sensation:  LE  Light touch, Intact to gross screen  Myotomes:  LE Intact to gross screen  Heel walking: normal  Toe Walking: normal      Special Tests  Lumbar:  Slump, Right LE  negative, Left LE   negative  Straight Leg Raise,  Right LE  negative, Left LE   negative  Hip: FADIR,  negative  FABER,  negative  Hip Scouring,  negative        Flexibility:   Hamstrings: WNL  Quads: Moderate tightness  Psoas: Moderate tightness    Neuromuscular Control  Dynamic Stability Assessment:  Bridge:  pass  / Glutes: active  Hamstrings: active  Abdominal Wall:  active  Right Side Plank Position:Toes  fail  Left Side Plank Position:  Toes  pass fail (knees pass B)    Mobility / Movement  Squat:  to chair, improved posterior hip hinge  Single leg stance:  LEFT: Duration 10-15 seconds / maintains normal, level pelvis / Postural sway, minimal, RIGHT: Duration 10-15 seconds /  maintains normal, level pelvis / Postural sway,  minimal           Directional Preference Testing  Directional Preference: Neutral             ASSESSMENT  Findings consistent with 45 y.o. female with lower back pain with scoliosis and right sided sciatica  with pain, ROM limitations, strength limitations, functional limitations. Patient demonstrates improvements with lumbar ROM, core stability allowing for reduced pain and increased ability to sustain walking.  Patient would continue to benefit from skilled PT to maximize function.    Significant presentation and prognostic factors include moderate symptom rating, low disability rating, and stable clinical status.   Based on clinical evaluation and assessment, the primary rehabilitation approach will include symptom modulation, movement control, functional optimization.      Personal factors affecting  treatment/recovery:   none identified  Comorbidities affecting treatment/recovery:   Scoliosis  Clinical presentation:   stable  Patient complexity:     moderate level as indicated by above stability of condition, personal factors, environmental factors and comorbidities in addition to patient symptom presentation and impairments found on physical exam.    Prognosis:  Fair    Contraindications/Precautions/Limitation:  Per diagnosis    Short Term Goals:2week(s) Decrease c/o max pain to < 4/10 and Minimal assistance with HEP/ education concepts (progressing)  Long Term Goals:6week(s) Walk community distances without increased symptoms, Stand as long as needed when completing ADL's without increased symptoms, Sit as long as needed with ADL's without increased symptoms, Ambulate stairs up and down without increased symptoms, Drive automobile without increased symptoms, Transfer from sit to stand and perform bed mobility without increased symptoms, Patient demonstrates improved functional core strength, Patient will return to full prior level of function  , Independent with symptom management (progressing)    TREATMENT PLAN  Patient/family involved in developing goals and treatment plan: Yes    Recommended Treatment Freq 1-2 times per weeks/months for 6 week(s)    Treatment plan inclusive of:  Exercise:AROM, AAROM, PROM, Stretching, Strengthening, Progressive Resistive, Coordination, Aerobic exercise  Manual Techniques:Joint mobilization, Soft tissue release/massage   Modalities:  Cold pack, Functional/Therapeutic activites per flowsheet, Moist heat, Neuromuscular Re-ed,  Activity as indicated, Ther Exercise per flowsheet  Functional: Proprioception/Dynamic stability, Functional rehab      Thank you for the referral of this patient to [facility].    Juliette Alcide, PT     Minutes   Time Based CPT  Physical Performance test, Therapeutic Exercise, Therapeutic Activities, NM Re-Education, Manual Therapy, Gait  Training, Massage, Aquatic Therapy, Canalith Repositioning, Iontophoresis, Ultrasound, Orthotic fitting/training, Prosthetic fitting 25 min   Service Based (untimed) CPT  PT/OT evaluation, PT/OT re-eval, E-stim-unattended, Mechanical traction, Vasopneumatic device 15 min   Unbilled time (rest, etc) 10 min       Total Treatment Time       Bike 10 min    Diaphragmatic breathing 10x   Bridge 6 x 10"   Curl up 2 x 5    Side plank knees, hip abd 5 x 10" B   Hip flexor stretch 3 x 30"   Side glide 10 x 5"   Childs pose  5 x 10"   Paloff Press Blue 10 x 5" B   Supine march 2 x 10       Suitcase carry 8# 6 x 67ft B           Moist heat 10 min

## 2021-02-26 ENCOUNTER — Encounter: Payer: Self-pay | Admitting: Gastroenterology

## 2021-02-26 ENCOUNTER — Ambulatory Visit: Payer: Medicaid (Managed Care) | Admitting: Rehabilitative and Restorative Service Providers"

## 2021-02-26 DIAGNOSIS — M5441 Lumbago with sciatica, right side: Secondary | ICD-10-CM

## 2021-02-26 DIAGNOSIS — G8929 Other chronic pain: Secondary | ICD-10-CM

## 2021-02-26 NOTE — Progress Notes (Signed)
[facility] Orthopedic Sports/Spine Rehabilitation  PT Treatment Note    Todays Date: 02/26/2021    Name: [name]  DOB: Mar 08, 1976  Referring Physician: Leola Brazil, MD  Diagnosis:   1. Chronic right-sided low back pain with right-sided sciatica         Visit #: 17    Subjective:  Pain Assessment: 4  Pt reports no significant changes in pain or function since previous treatment session.  States she has been doing well, she feels the weather does impact her back pain and notices increased pain in cloudy days.  She also states she has found that if she presses on the left side of her back it relieves the right side of back pain.  Finds the [employer] press exercise bothers her.     Objective:  ROM -  Lumbar Spine  Strength - Therapeutic Exercises per flowsheet  Function: - Improved  Education:  Updated HEP, Verbal cues for ther ex, Manual cues for ther ex      Treatment:  Ther Exercise per flowsheet, moist heat    Assessment:   We discontinued paloff press and increased challenge with SL bridge to increase rotation stabilization.  Tolerates routine well.       Plan of Care:  Continue per Plan of care -  As written; Patient would benefit from skilled rehabilitation services to address the above impairments to restore functional capacity.    Thank you for referring this patient to [facility] Orthopaedics - Sports and Spine Rehabilitation    Juliette Alcide, PT       Minutes   Time Based CPT  Physical Performance test, Therapeutic Exercise, Therapeutic Activities, NM Re-Education, Manual Therapy, Gait Training, Massage, Aquatic Therapy, Canalith Repositioning, Iontophoresis, Ultrasound, Orthotic fitting/training, Prosthetic fitting 40 min   Service Based (untimed) CPT  PT/OT evaluation, PT/OT re-eval, E-stim-unattended, Mechanical traction, Vasopneumatic device    Unbilled time (rest, etc)        Total Treatment Time 40 min     Bike 8  min    Diaphragmatic breathing 10x   Bridge DL  Bridge SL 3 x  10"  84O   Curl up 2 x 5    Side plank knees, hip abd 5 x 10" B   Hip flexor stretch 3 x 30"   Side glide 10 x 5"   Childs pose  5 x 10"   Quadruped rotation 10 x 5"   Paloff Press discontinued   Supine march 2 x 10       Suitcase carry 8# 6 x 79ft B           Moist heat

## 2021-03-05 ENCOUNTER — Ambulatory Visit: Payer: Medicaid (Managed Care) | Admitting: Rehabilitative and Restorative Service Providers"

## 2021-03-05 DIAGNOSIS — G8929 Other chronic pain: Secondary | ICD-10-CM

## 2021-03-05 DIAGNOSIS — M5441 Lumbago with sciatica, right side: Secondary | ICD-10-CM

## 2021-03-05 NOTE — Progress Notes (Signed)
[facility] Orthopedic Sports/Spine Rehabilitation  PT Treatment Note    Todays Date: 03/05/2021    Name: [name]  DOB: 05/22/1976  Referring Physician: Leola Brazil, MD  Diagnosis:   1. Chronic right-sided low back pain with right-sided sciatica         Visit #: 18    Subjective:  Pain Assessment: 4  Pt reports no significant changes in pain or function since previous treatment session.  States she has noticed an overall improvement with symptoms.  She feels she is learning her body and is more aware.     Objective:  ROM -  Lumbar Spine  Strength - Therapeutic Exercises per flowsheet  Function: - Improved  Education:  Updated HEP, Verbal cues for ther ex, Manual cues for ther ex      Treatment:  Ther Exercise per flowsheet, moist heat    Assessment:   Continued with current routine; patient requires minimal to no cues with table routine.  Minor cues with standing exercises.       Plan of Care:  Continue per Plan of care -  As written; Patient would benefit from skilled rehabilitation services to address the above impairments to restore functional capacity.    Thank you for referring this patient to [facility] Orthopaedics - Sports and Spine Rehabilitation    Juliette Alcide, PT       Minutes   Time Based CPT  Physical Performance test, Therapeutic Exercise, Therapeutic Activities, NM Re-Education, Manual Therapy, Gait Training, Massage, Aquatic Therapy, Canalith Repositioning, Iontophoresis, Ultrasound, Orthotic fitting/training, Prosthetic fitting 40 min   Service Based (untimed) CPT  PT/OT evaluation, PT/OT re-eval, E-stim-unattended, Mechanical traction, Vasopneumatic device    Unbilled time (rest, etc)        Total Treatment Time 40 min     Bike 8  min    Diaphragmatic breathing 10x   Bridge DL  Bridge SL 3 x 10"  96E   Curl up 2 x 5    Side plank knees, hip abd 5 x 10" B   Hip flexor stretch 3 x 30"   Side glide 10 x 5"   Childs pose  5 x 10"   Quadruped rotation 10 x 5"   Paloff  Press discontinued   Supine march 2 x 10       Suitcase carry 8# 6 x 77ft B           Moist heat 10 min

## 2021-03-14 ENCOUNTER — Other Ambulatory Visit: Payer: Self-pay

## 2021-03-24 ENCOUNTER — Ambulatory Visit
Payer: Medicaid (Managed Care) | Attending: General Practice | Admitting: Rehabilitative and Restorative Service Providers"

## 2021-03-24 DIAGNOSIS — M5441 Lumbago with sciatica, right side: Secondary | ICD-10-CM | POA: Insufficient documentation

## 2021-03-24 DIAGNOSIS — G8929 Other chronic pain: Secondary | ICD-10-CM | POA: Insufficient documentation

## 2021-03-24 DIAGNOSIS — M545 Low back pain, unspecified: Secondary | ICD-10-CM | POA: Insufficient documentation

## 2021-03-24 NOTE — Progress Notes (Signed)
[facility] Orthopedic Sports/Spine Rehabilitation  PT Treatment Note    Todays Date: 03/24/2021    Name: [name]  DOB: 25-Sep-1976  Referring Physician: Leola Brazil, MD  Diagnosis:   1. Chronic right-sided low back pain with right-sided sciatica         Visit #: 19    Subjective:  Pain Assessment: 4  Pt reports no significant changes in pain or function since previous treatment session.  States she has noticed an overall improvement with symptoms.  She feels she is learning her body and is more aware.  She feels she is ready to trial an HEP on her own.     Objective:  ROM -  Lumbar Spine  LUMBAR AROM / Movement pattern  Flexion: Reach to to feet with normal movement, right rib hump present  Extension:  minor loss with end range pain improved  Left Sidebend: Reach to distal knee with normal movement  Right Sidebend: Reach to distal knee with normal movement, end range discomfort  Left Rotation:  minor loss with normal movement  Right Rotation:  minor loss with normal movement  Hip ROM: WNL  Hip strength: grossly 4+/5  Strength - Therapeutic Exercises per flowsheet  Function: - Improved  Education:  Updated HEP, Verbal cues for ther ex, Manual cues for ther ex      Treatment:  Ther Exercise per flowsheet, moist heat  ASSESSMENT  Findings consistent with 45 y.o. female with lower back pain with scoliosis and right sided sciatica  with pain, ROM limitations, strength limitations, functional limitations. Patient has made improvements with lumbar ROM and improved core stability and functional strength since starting therapy.  She is currently independent with HEP and will be discharged.  All questions were invited and answered to patient's satisfaction.    Personal factors affecting treatment/recovery:   none identified  Comorbidities affecting treatment/recovery:   Scoliosis  Clinical presentation:   stable  Patient complexity:     moderate level as indicated by above stability of condition, personal factors,  environmental factors and comorbidities in addition to patient symptom presentation and impairments found on physical exam.    Prognosis:  Fair    Contraindications/Precautions/Limitation:  Per diagnosis    Short Term Goals:2week(s) Decrease c/o max pain to < 4/10 and Minimal assistance with HEP/ education concepts (progressing)  Long Term Goals:6week(s) Walk community distances without increased symptoms, Stand as long as needed when completing ADL's without increased symptoms, Sit as long as needed with ADL's without increased symptoms, Ambulate stairs up and down without increased symptoms, Drive automobile without increased symptoms, Transfer from sit to stand and perform bed mobility without increased symptoms, Patient demonstrates improved functional core strength, Patient will return to full prior level of function  , Independent with symptom management (progressing)    TREATMENT PLAN  Patient/family involved in developing goals and treatment plan: Yes    Recommended Treatment Freq: trial independent HEP    Thank you for referring this patient to [facility] Orthopaedics - Sports and Spine Rehabilitation    Juliette Alcide, PT       Minutes   Time Based CPT  Physical Performance test, Therapeutic Exercise, Therapeutic Activities, NM Re-Education, Manual Therapy, Gait Training, Massage, Aquatic Therapy, Canalith Repositioning, Iontophoresis, Ultrasound, Orthotic fitting/training, Prosthetic fitting 40 min   Service Based (untimed) CPT  PT/OT evaluation, PT/OT re-eval, E-stim-unattended, Mechanical traction, Vasopneumatic device    Unbilled time (rest, etc)        Total Treatment Time  40 min     Bike 8  min    Diaphragmatic breathing 10x   Bridge DL  Bridge SL 3 x 10"  09N   Curl up 2 x 5    Side plank knees, hip abd 5 x 10" B   Hip flexor stretch 3 x 30"   Side glide 10 x 5"   Childs pose  5 x 10"   Quadruped rotation 10 x 5"   Paloff Press discontinued   Supine march 2 x 10   SLS  foam 5 x 15"   Suitcase carry 8# 6 x 24ft B           Moist heat 10 min

## 2021-04-09 ENCOUNTER — Other Ambulatory Visit
Admission: RE | Admit: 2021-04-09 | Discharge: 2021-04-09 | Disposition: A | Discharge status: Home / Self Care | Payer: Medicaid (Managed Care) | Source: Ambulatory Visit | Attending: Internal Medicine | Admitting: Internal Medicine

## 2021-04-09 DIAGNOSIS — Z79891 Long term (current) use of opiate analgesic: Secondary | ICD-10-CM | POA: Insufficient documentation

## 2021-04-10 ENCOUNTER — Encounter: Payer: Self-pay | Admitting: Gastroenterology

## 2021-04-10 LAB — PAIN CLINIC PROFILE
Amphetamine,UR: POSITIVE
Benzodiazepinen,UR: NEGATIVE
Cocaine/Metab,UR: NEGATIVE
Opiates,UR: NEGATIVE
Oxycodone/Oxymorphone,UR: POSITIVE
THC Metabolite,UR: POSITIVE

## 2021-04-12 ENCOUNTER — Other Ambulatory Visit: Payer: Self-pay | Admitting: Gastroenterology

## 2021-04-12 LAB — CONFIRM OPIATES: Confirm Opiates: POSITIVE

## 2021-04-12 LAB — CONFIRM THC METABOLITE, URINE: Confirm THC Metab: POSITIVE

## 2021-04-13 LAB — CONFIRM AMPHET: Confirm Amphet: NEGATIVE

## 2021-04-17 ENCOUNTER — Other Ambulatory Visit: Payer: Self-pay | Admitting: Gastroenterology

## 2021-04-17 MED ORDER — SUCRALFATE 1 GM PO TABS *I*
ORAL_TABLET | ORAL | 2 refills | Status: AC
Start: 2021-04-17 — End: 2022-04-16

## 2021-04-20 ENCOUNTER — Other Ambulatory Visit
Admission: RE | Admit: 2021-04-20 | Discharge: 2021-04-20 | Disposition: A | Discharge status: Home / Self Care | Payer: Medicaid (Managed Care) | Source: Ambulatory Visit | Attending: Internal Medicine | Admitting: Internal Medicine

## 2021-04-20 DIAGNOSIS — I10 Essential (primary) hypertension: Secondary | ICD-10-CM | POA: Insufficient documentation

## 2021-04-20 LAB — BASIC METABOLIC PANEL
Anion Gap: 11 (ref 7–16)
CO2: 27 mmol/L (ref 20–28)
Calcium: 9.5 mg/dL (ref 8.6–10.2)
Chloride: 103 mmol/L (ref 96–108)
Creatinine: 0.78 mg/dL (ref 0.51–0.95)
Glucose: 87 mg/dL (ref 60–99)
Lab: 13 mg/dL (ref 6–20)
Potassium: 4.2 mmol/L (ref 3.3–5.1)
Sodium: 141 mmol/L (ref 133–145)
eGFR BY CREAT: 95 *

## 2021-04-29 ENCOUNTER — Other Ambulatory Visit: Payer: Self-pay | Admitting: Dermatology

## 2021-05-01 ENCOUNTER — Telehealth: Payer: Self-pay

## 2021-05-01 NOTE — Telephone Encounter (Signed)
Patient calling regarding 6/21 referral. Referral placed for 5 year repeat COB. Patient requesting to transition care to [facility]. Last colonoscopy was 07/2016. Can be reached at [phone]      1. Are you on any blood thinners? no   a. If yes, who prescribes them and what is their phone number?  n/a  2. Do you have an automated defibrillator? NO    3. Do you have an LVAD? NO     4. Do you have a tracheostomy? NO    5. Do you use BIPAP or oxygen at home? NO    6. For scheduling safety precautions, is the patient's BMI under 45? Estimated body mass index is 26.61 kg/m as calculated from the following:  7.   Height as of 09/21/20: 1.727 m (5\' 8" ).  8.   Weight as of 09/21/20: 79.4 kg (175 lb).    YES        TO ENSURE APPROPRIATE CLEANSE PREPARATION FOR COLONOSCOPY SCHEDULING ONLY:    1. Are you diabetic? NO    2. Do you move your bowels daily or every other day? YES  - [facility] WILL BE SENT FOR NON DIABETIC PATIENTS.    3. Have you been diagnosed with kidney disease? NO          FOR ENDOSCOPY (includes CEN) SCHEDULING ONLY  1. Have you ever had banding for esophageal varices? NOT APPLICABLE.

## 2021-05-02 ENCOUNTER — Other Ambulatory Visit: Payer: Self-pay

## 2021-06-29 ENCOUNTER — Other Ambulatory Visit: Payer: Self-pay | Admitting: Internal Medicine

## 2021-06-29 ENCOUNTER — Ambulatory Visit
Admission: RE | Admit: 2021-06-29 | Discharge: 2021-06-29 | Disposition: A | Discharge status: Home / Self Care | Payer: Medicaid (Managed Care) | Source: Ambulatory Visit | Attending: Internal Medicine | Admitting: Internal Medicine

## 2021-06-29 DIAGNOSIS — M4125 Other idiopathic scoliosis, thoracolumbar region: Secondary | ICD-10-CM

## 2021-06-29 DIAGNOSIS — M419 Scoliosis, unspecified: Secondary | ICD-10-CM | POA: Insufficient documentation

## 2021-07-23 ENCOUNTER — Other Ambulatory Visit
Admission: RE | Admit: 2021-07-23 | Discharge: 2021-07-23 | Disposition: A | Discharge status: Home / Self Care | Payer: Medicaid (Managed Care) | Source: Ambulatory Visit | Attending: Internal Medicine | Admitting: Internal Medicine

## 2021-07-23 DIAGNOSIS — Z13220 Encounter for screening for lipoid disorders: Secondary | ICD-10-CM | POA: Insufficient documentation

## 2021-07-23 DIAGNOSIS — Z131 Encounter for screening for diabetes mellitus: Secondary | ICD-10-CM | POA: Insufficient documentation

## 2021-07-23 LAB — BASIC METABOLIC PANEL
Anion Gap: 10 (ref 7–16)
CO2: 26 mmol/L (ref 20–28)
Calcium: 9.7 mg/dL (ref 8.6–10.2)
Chloride: 107 mmol/L (ref 96–108)
Creatinine: 0.64 mg/dL (ref 0.51–0.95)
Glucose: 90 mg/dL (ref 60–99)
Lab: 15 mg/dL (ref 6–20)
Potassium: 4.5 mmol/L (ref 3.3–5.1)
Sodium: 143 mmol/L (ref 133–145)
eGFR BY CREAT: 110 *

## 2021-07-23 LAB — CHOLESTEROL, TOTAL: Cholesterol: 178 mg/dL

## 2021-07-23 LAB — HEMOGLOBIN A1C: Hemoglobin A1C: 5.4 %

## 2021-07-23 LAB — HEPATITIS C ANTIBODY: Hep C Ab: NEGATIVE

## 2021-07-23 LAB — HDL CHOLESTEROL: HDL: 39 mg/dL — ABNORMAL LOW (ref 40–60)

## 2021-07-27 ENCOUNTER — Other Ambulatory Visit: Payer: Self-pay

## 2021-07-27 ENCOUNTER — Ambulatory Visit
Admission: RE | Admit: 2021-07-27 | Discharge: 2021-07-27 | Disposition: A | Discharge status: Home / Self Care | Payer: Medicaid (Managed Care) | Source: Ambulatory Visit

## 2021-07-27 ENCOUNTER — Other Ambulatory Visit: Payer: Medicaid (Managed Care)

## 2021-07-27 DIAGNOSIS — M810 Age-related osteoporosis without current pathological fracture: Secondary | ICD-10-CM

## 2021-11-30 ENCOUNTER — Other Ambulatory Visit
Admission: RE | Admit: 2021-11-30 | Discharge: 2021-11-30 | Disposition: A | Discharge status: Home / Self Care | Payer: Medicaid (Managed Care) | Source: Ambulatory Visit | Attending: Internal Medicine | Admitting: Internal Medicine

## 2021-11-30 DIAGNOSIS — Z79891 Long term (current) use of opiate analgesic: Secondary | ICD-10-CM | POA: Insufficient documentation

## 2021-11-30 LAB — PAIN CLINIC PROFILE
Amphetamine,UR: NEGATIVE
Benzodiazepinen,UR: NEGATIVE
Cocaine/Metab,UR: NEGATIVE
Opiates,UR: NEGATIVE
Oxycodone/Oxymorphone,UR: POSITIVE
THC Metabolite,UR: POSITIVE

## 2021-12-06 ENCOUNTER — Encounter: Payer: Self-pay | Admitting: Gastroenterology

## 2021-12-17 ENCOUNTER — Other Ambulatory Visit: Payer: Self-pay

## 2021-12-17 ENCOUNTER — Encounter: Payer: Self-pay | Admitting: Physical Medicine and Rehabilitation

## 2021-12-17 ENCOUNTER — Ambulatory Visit
Admission: RE | Admit: 2021-12-17 | Discharge: 2021-12-17 | Disposition: A | Discharge status: Home / Self Care | Payer: Medicaid (Managed Care) | Source: Ambulatory Visit

## 2021-12-17 ENCOUNTER — Ambulatory Visit
Payer: Medicaid (Managed Care) | Attending: Physical Medicine and Rehabilitation | Admitting: Physical Medicine and Rehabilitation

## 2021-12-17 VITALS — BP 133/69 | HR 71

## 2021-12-17 DIAGNOSIS — M419 Scoliosis, unspecified: Secondary | ICD-10-CM

## 2021-12-17 DIAGNOSIS — M1611 Unilateral primary osteoarthritis, right hip: Secondary | ICD-10-CM

## 2021-12-17 DIAGNOSIS — M7918 Myalgia, other site: Secondary | ICD-10-CM | POA: Insufficient documentation

## 2021-12-17 DIAGNOSIS — M5431 Sciatica, right side: Secondary | ICD-10-CM | POA: Insufficient documentation

## 2021-12-17 NOTE — Progress Notes (Signed)
[name]  1975-11-01  [id number]    Chief complaint: Follow-up low back/right gluteal pain and right leg pain    HPI: Patient is seen in follow consultation for right-sided low back/gluteal pain which radiates posteriorly down the right lower extremity does not travel below the knee.  Patient also complains of what she perceives to be a worsening of her scoliosis.  Patient states that her body seems to be shifting more to one side.  She also states that her clothing does not fit properly.    Symptoms are aggravated with all activities.    Patient does not identify any mitigating factors.    She endorses compliance with her home exercise program as well as her medications.    Prior treatment: right L4, L5 TFSNRB diminished pain for a couple of weeks    Patient states her primary care provider prescribed a brace for her which she states aggravates her pain.    Patient had a fall November 02, 2021 when she fell on her left buttock. She feels as though there is a bone protruding in her right buttock, however she stated that the PCP did not feel it. When she presses on the area, she has pain that radiates up her back.    Patient had a surgical consultation with Dr. Delila Spence in 2021. She had quit smoking cigarettes, but smokes 2 cigars per day. She thinks that she is ready to proceed with surgery.    Patient denies  Progressive lower extremity weakness, constitutional symptoms or bowel or bladder dysfunction.    Physical exam:    Vitals:    12/17/21 0857   BP: 133/69   Pulse: 71     Alert and oriented female in no acute distress.  Gait without antalgia. She has a truncal shift. There is no thoracolumbar spinous process tenderness. There tenderness to palpation along the right thoracolumbar paraspinal muscle. Palpation of the right midgluteal region causes pain in the right thoracolumbar paraspinal muscles. Lumbar flexion achieved to toes. Lumbar extension was pain provoking. Strength 5/5 in the lower extremities  bilaterally across all lumbar myotomes.    Radiologic review:    Xray of the pelvis performed in office today personally reviewed with patient demonstrating an advanced dextroscoliosis. There are mild degenerative changes of the bilateral hips. No fractures were noted.    Impression:    Low back/right gluteal and right leg pai    Scoliosis    Plan:    Treatment plan discussed with patient.    She will continue her home exercise program.     She will discontinue wearing her back brace as it aggravates her pain.    She will continue her analgesic regimen.    I will obtain updated MRIs of the thoracic and lumbar spines as well as Ct scans of the thoracic and lumbar spines.    She has a current scoliosis xray and DEXA scan.    She understands that a surgery of this type is a Chiropodist.    She plans to stop smoking the 2 cigars per day and will start using nicotine replacement patches.  She hopes to have surgery in April. She knows that she needs to be smoke free at 6 weeks prior to surgery.    She will follow up with Dr. Birdena Crandall after her imaging has been completed.    Please feel free to contact me with any questions or concerns that you may have    Duffy Rhody, [place]

## 2021-12-26 ENCOUNTER — Telehealth: Payer: Self-pay

## 2021-12-26 NOTE — Telephone Encounter (Signed)
RX Refill    Reason for Call:   refill request   Who is calling:   Patient  What Medication is Patient Requesting:  tretinoin and clindamycin  Provider: previously NPs Garth Schlatter and [place]  Last Visit in Dermatology:  01/20/20, upcoming visit scheduled for 01/01/22  Patient can be reached at: MyChart [phone]  Can we leave a detailed message:   Yes    Additional comments:     Please send refills to [facility] on [facility].    Thank you

## 2021-12-27 ENCOUNTER — Other Ambulatory Visit: Payer: Self-pay | Admitting: Dermatology

## 2021-12-27 DIAGNOSIS — L700 Acne vulgaris: Secondary | ICD-10-CM

## 2021-12-27 MED ORDER — CLINDAMYCIN PHOSPHATE 1 % EX LOTN *I*
TOPICAL_LOTION | CUTANEOUS | 0 refills | Status: DC
Start: 2021-12-27 — End: 2022-01-01

## 2021-12-27 MED ORDER — TRETINOIN 0.05 % EX CREA *I*
TOPICAL_CREAM | Freq: Every evening | CUTANEOUS | 0 refills | Status: DC
Start: 2021-12-27 — End: 2022-01-01

## 2021-12-27 NOTE — Progress Notes (Signed)
1 refill of tretinoin and clinda lotion sent (patient due for apt, sched for 01/01/22).

## 2022-01-01 ENCOUNTER — Encounter: Payer: Self-pay | Admitting: Dermatology

## 2022-01-01 ENCOUNTER — Ambulatory Visit: Payer: Medicaid (Managed Care) | Admitting: Dermatology

## 2022-01-01 ENCOUNTER — Other Ambulatory Visit: Payer: Self-pay

## 2022-01-01 ENCOUNTER — Telehealth: Payer: Self-pay

## 2022-01-01 VITALS — Temp 97.8°F | Ht 68.0 in | Wt 165.0 lb

## 2022-01-01 DIAGNOSIS — L700 Acne vulgaris: Secondary | ICD-10-CM

## 2022-01-01 MED ORDER — CLINDAMYCIN PHOSPHATE 1 % EX GEL *I*
Freq: Every day | CUTANEOUS | 3 refills | Status: AC
Start: 2022-01-01 — End: ?

## 2022-01-01 MED ORDER — TRETINOIN 0.1 % EX CREA *I*
TOPICAL_CREAM | CUTANEOUS | 3 refills | Status: DC
Start: 2022-01-01 — End: 2022-08-19

## 2022-01-01 NOTE — Patient Instructions (Addendum)
Acne, mild, comedonal: Face, back  -Diagnosis and treatment options discussed  -Acne medications may take 2-3 months of daily use to see improvements.  -dont scrub or pick at/push at pimples.    IN MORNING:  -Start over the counter benzoyl peroxide 5-10% wash to the face and shoulders   -Note that this can bleach clothes or towels if not washed off well and can also dry your skin out   -If your skin becomes itchy and dry, stop the wash for a few days and moisturize  -Start clindamycin 1% gel each morning to the shoulder and face    AT NIGHT:  -continue using a gentle noncomedogenic cleanser (NOT BENZOYL PEROXIDE)  -Start tretinoin 0.1% cream 3 nights per week, apply pea size after washing the face   -If tolerated, may increase to every other night and eventually to nightly use     -If dryness occurs, may space the cream out to a few times per week and/or moisturize with a noncomedogenic moisturizer (CereVe or Cetaphil)

## 2022-01-01 NOTE — Progress Notes (Signed)
Dermatology Progress Note    Chief Complaint   Patient presents with    New Patient Visit     Spots of concern       Derm History and Relevant Medical History:   Acne  ?  HPI:   [name] is a pleasant 46 y.o. female here for the concern noted below.  ?  Last visit: 01/20/20 with Gordy Savers, NP    Concern: Acne  -since teens  -has IUD  -predominantly on face and shoulders  -using tretinoin 0.05%, able to use every night  -does not recall using BP wash  -had previously used clinda lotion, felt like it was runny consistency and discontinued.     -uses CeraVe acne wash, and CeraVe lotion.    Physical Exam:   Vitals:    01/01/22 0858   Temp: 36.6 C (97.8 F)   Weight: 74.8 kg (165 lb)   Height: 1.727 m (5\' 8" )     Skin: All of the following were examined, and were within normal limits, except as noted: Face and Back  On the cheeks and nose are scattered open and closed comedones.  Few closed comedones on the forehead and shoulders.     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  -dont scrub or pick at/push at pimples.    IN MORNING:  -Start over the counter benzoyl peroxide 5-10% wash to the face and shoulders   -Note that this can bleach clothes or towels if not washed off well and can also dry your skin out   -If your skin becomes itchy and dry, stop the wash for a few days and moisturize  -Start clindamycin 1% gel each morning to the shoulder and face    AT NIGHT:   -continue using a gentle noncomedogenic cleanser (NOT BENZOYL PEROXIDE)  -Start tretinoin 0.1% cream 3 nights per week, apply pea size after washing the face   -If tolerated, may increase to every other night and eventually to nightly use     -If dryness occurs, may space the cream out to a few times per week and/or moisturize with a noncomedogenic moisturizer (CereVe or Cetaphil)    Return to clinic: 3  months    , NP  01/01/22  9:27 AM

## 2022-01-01 NOTE — Telephone Encounter (Signed)
Pt needs 102mo f/u in June w [facility] - no schedule in for June     Thanks!

## 2022-01-08 NOTE — Telephone Encounter (Signed)
Called pt and schedule with Dermatology Aniceto Boss, NP) on Friday 03/29/2022 at 10:00 AM/DCG

## 2022-01-15 ENCOUNTER — Other Ambulatory Visit: Payer: Medicaid (Managed Care)

## 2022-01-15 ENCOUNTER — Other Ambulatory Visit: Payer: Medicaid (Managed Care) | Admitting: Radiology

## 2022-01-16 ENCOUNTER — Encounter: Payer: Self-pay | Admitting: Dermatology

## 2022-01-16 NOTE — Telephone Encounter (Signed)
Patient called to inform us that she wasn't able to pick up the tretinoin because her insurance wont cover the new dosage. Patient asked that we resend the order for the original dosage.

## 2022-01-17 ENCOUNTER — Other Ambulatory Visit
Admission: RE | Admit: 2022-01-17 | Discharge: 2022-01-17 | Disposition: A | Discharge status: Home / Self Care | Payer: Medicaid (Managed Care) | Source: Ambulatory Visit | Attending: Physician Assistant | Admitting: Physician Assistant

## 2022-01-17 ENCOUNTER — Encounter: Payer: Self-pay | Admitting: Dermatology

## 2022-01-17 DIAGNOSIS — Z202 Contact with and (suspected) exposure to infections with a predominantly sexual mode of transmission: Secondary | ICD-10-CM | POA: Insufficient documentation

## 2022-01-18 LAB — TRICHOMONAS NAAT (PCR): Trichomonas NAAT (PCR): POSITIVE — AB

## 2022-01-19 LAB — CHLAMYDIA NAAT (PCR): Chlamydia NAAT (PCR): NEGATIVE

## 2022-01-19 LAB — N. GONORRHOEAE NAAT (PCR): N. gonorrhoeae NAAT (PCR): NEGATIVE

## 2022-01-24 ENCOUNTER — Ambulatory Visit: Payer: Medicaid (Managed Care) | Admitting: Orthopedic Surgery

## 2022-02-15 ENCOUNTER — Ambulatory Visit: Payer: Medicaid (Managed Care)

## 2022-02-15 ENCOUNTER — Ambulatory Visit
Admission: RE | Admit: 2022-02-15 | Discharge: 2022-02-15 | Disposition: A | Discharge status: Home / Self Care | Payer: Medicaid (Managed Care) | Source: Ambulatory Visit | Attending: Physical Medicine and Rehabilitation | Admitting: Physical Medicine and Rehabilitation

## 2022-02-15 ENCOUNTER — Other Ambulatory Visit: Payer: Self-pay

## 2022-02-15 DIAGNOSIS — M419 Scoliosis, unspecified: Secondary | ICD-10-CM

## 2022-02-15 DIAGNOSIS — M5431 Sciatica, right side: Secondary | ICD-10-CM | POA: Insufficient documentation

## 2022-02-15 DIAGNOSIS — M4185 Other forms of scoliosis, thoracolumbar region: Secondary | ICD-10-CM | POA: Insufficient documentation

## 2022-02-27 ENCOUNTER — Other Ambulatory Visit
Admission: RE | Admit: 2022-02-27 | Discharge: 2022-02-27 | Disposition: A | Discharge status: Home / Self Care | Payer: Medicaid (Managed Care) | Source: Ambulatory Visit | Attending: Obstetrics and Gynecology | Admitting: Obstetrics and Gynecology

## 2022-02-27 DIAGNOSIS — Z7251 High risk heterosexual behavior: Secondary | ICD-10-CM | POA: Insufficient documentation

## 2022-02-27 DIAGNOSIS — Z124 Encounter for screening for malignant neoplasm of cervix: Secondary | ICD-10-CM | POA: Insufficient documentation

## 2022-02-27 DIAGNOSIS — Z118 Encounter for screening for other infectious and parasitic diseases: Secondary | ICD-10-CM | POA: Insufficient documentation

## 2022-02-27 DIAGNOSIS — Z113 Encounter for screening for infections with a predominantly sexual mode of transmission: Secondary | ICD-10-CM | POA: Insufficient documentation

## 2022-03-02 LAB — N. GONORRHOEAE NAAT (PCR): N. gonorrhoeae NAAT (PCR): NEGATIVE

## 2022-03-02 LAB — CHLAMYDIA NAAT (PCR): Chlamydia NAAT (PCR): NEGATIVE

## 2022-03-02 LAB — TRICHOMONAS NAAT (PCR): Trichomonas NAAT (PCR): NEGATIVE

## 2022-03-04 ENCOUNTER — Other Ambulatory Visit: Payer: Self-pay

## 2022-03-04 ENCOUNTER — Other Ambulatory Visit: Payer: Self-pay | Admitting: Obstetrics and Gynecology

## 2022-03-04 ENCOUNTER — Ambulatory Visit
Admission: RE | Admit: 2022-03-04 | Discharge: 2022-03-04 | Disposition: A | Discharge status: Home / Self Care | Payer: Medicaid (Managed Care) | Source: Ambulatory Visit | Attending: Obstetrics and Gynecology | Admitting: Obstetrics and Gynecology

## 2022-03-04 ENCOUNTER — Encounter: Payer: Self-pay | Admitting: Gastroenterology

## 2022-03-04 DIAGNOSIS — Z1231 Encounter for screening mammogram for malignant neoplasm of breast: Secondary | ICD-10-CM | POA: Insufficient documentation

## 2022-03-13 ENCOUNTER — Telehealth: Payer: Self-pay

## 2022-03-13 LAB — GYN CYTOLOGY

## 2022-03-13 NOTE — Telephone Encounter (Signed)
Called x2 and LVM second try for pt to return call to the office to schedule follow up with Charlynn Grimes, PA

## 2022-03-28 ENCOUNTER — Other Ambulatory Visit: Payer: Self-pay | Admitting: Dermatology

## 2022-03-29 ENCOUNTER — Ambulatory Visit: Payer: Medicaid (Managed Care) | Admitting: Dermatology

## 2022-03-29 ENCOUNTER — Other Ambulatory Visit: Payer: Self-pay

## 2022-03-29 VITALS — Ht 68.0 in | Wt 151.0 lb

## 2022-03-29 DIAGNOSIS — L700 Acne vulgaris: Secondary | ICD-10-CM

## 2022-03-29 NOTE — Patient Instructions (Addendum)
IN MORNING:  -Continue CeraVe wash in morning, okay if this wash has benzoyl peroxide in it.  -Continue clindamycin 1% gel each morning to the shoulders and face  -can purchase a long shoe horn to help with applying clindamycin to back.    AT NIGHT:   -continue using a gentle noncomedogenic cleanser (NOT BENZOYL PEROXIDE).  Keep using dove cleanser at night.  -Continue tretinoin 0.1% cream daily, apply pea size after washing the face.   - it is okay to continue to mix with clindamycin if this works well for you!   -If dryness occurs, may space the cream out to a few times per week and/or moisturize with a noncomedogenic moisturizer (CereVe or Cetaphil).

## 2022-03-29 NOTE — Progress Notes (Signed)
Dermatology Progress Note    Chief Complaint   Patient presents with   . Follow-up     acne       Derm History and Relevant Medical History:   Acne  ?  HPI:   [name] is a pleasant 46 y.o. female here for the concern noted below.  ?  Last visit: 01/01/22 with Kathryne Hitch, NP    Concern: Acne  -predominantly on face and shoulders  -has been doing very well and is very happy with her progress.   -using tretinoin 0.1%, able to use every night and sometimes mixes with clindamycin  -uses a dove beauty bar at night and a cerave acne wash in morning.    -uses clinda on face in AM, but hard to reach back for application as she has scoliosis and limited ROM.    Physical Exam:   Vitals:    03/29/22 0957   Weight: 68.5 kg (151 lb)   Height: 1.727 m (5\' 8" )     Skin: All of the following were examined, and were within normal limits, except as noted: Face and Back  Few closed comedones on the forehead and shoulders.   Few inflammatory pustules on back admixed with brown macules.    Assessment/Plan:    Acne, comedonal, mild  -Diagnosis and treatment options discussed.  -Patient is very happy with her progress.   -dont scrub or pick at/push at pimples.    IN MORNING:  -Continue CeraVe wash in morning, okay if this wash has benzoyl peroxide in it.  -Continue clindamycin 1% gel each morning to the shoulders and face  -can purchase a long shoe horn to help with applying clindamycin to back.    AT NIGHT:   -continue using a gentle noncomedogenic cleanser (NOT BENZOYL PEROXIDE).  Can keep using dove cleanser at night.  -Continue tretinoin 0.1% cream daily, apply pea size after washing the face.   -it is okay to continue to mix with clindamycin if this works well for you.   -If dryness occurs, may space the cream out to a few times per week and/or moisturize with a noncomedogenic moisturizer (CereVe or Cetaphil).    Return to clinic: prn    Aniceto Boss, NP  03/29/22  10:10 AM

## 2022-06-13 ENCOUNTER — Encounter: Payer: Self-pay | Admitting: Gastroenterology

## 2022-07-25 ENCOUNTER — Telehealth: Payer: Self-pay

## 2022-07-25 NOTE — Telephone Encounter (Signed)
Dickey Gave, PA  [name]  Consult prior due to uncontrolled diarrhea, weight loss    Please schedule general GI 6-8 weeks

## 2022-07-25 NOTE — Telephone Encounter (Signed)
Referral sent to [facility] for review

## 2022-07-25 NOTE — Telephone Encounter (Unsigned)
Copied from CRM [id number]. Topic: Appointments - Appointment Information  >> Jul 25, 2022 10:33 AM Earline Mayotte wrote:  [name], Patient, is calling to check status of referral.   First and last name of the referring Provider  Zeafla, Eduard Roux, PA.  Please contact patient back to advise at [phone].

## 2022-07-31 NOTE — Telephone Encounter (Signed)
Sched w/ pt

## 2022-08-06 ENCOUNTER — Ambulatory Visit: Payer: Medicaid (Managed Care) | Admitting: Gastroenterology

## 2022-08-06 ENCOUNTER — Other Ambulatory Visit: Payer: Self-pay

## 2022-08-06 ENCOUNTER — Encounter: Payer: Self-pay | Admitting: Gastroenterology

## 2022-08-06 VITALS — BP 145/94 | HR 66 | Temp 95.4°F | Resp 16 | Wt 142.3 lb

## 2022-08-06 DIAGNOSIS — R634 Abnormal weight loss: Secondary | ICD-10-CM

## 2022-08-06 DIAGNOSIS — K5900 Constipation, unspecified: Secondary | ICD-10-CM

## 2022-08-06 DIAGNOSIS — R1084 Generalized abdominal pain: Secondary | ICD-10-CM

## 2022-08-06 DIAGNOSIS — K625 Hemorrhage of anus and rectum: Secondary | ICD-10-CM

## 2022-08-06 NOTE — Progress Notes (Signed)
[name]  [id number]     08/06/2022  Ayesha Rumpf, PA  [address]  [place],  [place] [address]      Chief Complaint/Reason for Office Visit: rectal bleeding, weight loss, abdominal pain    We had the pleasure of seeing your patient, [name], in the outpatient gastroenterology/hepatology clinic. As you know, she is a 46 y.o. female with medical history of anemia, anxiety, arthritis, depression, fibromyalgia, GERD, HTN, cholecystectomy, scoliosis who presents for multiple GI concerns.    EGD on 09/27/2017 noted mild antral erythema without erosions or ulcers.  Colonoscopy on 08/07/2016 was normal.  Patient's father had colon caner at age 76.  She reports paternal aunts and a paternal grandmother with some type of cancer.    Patient has lost about 35-40 pounds over the last  years unintentionally.  She reports a low appetite.  She has a history of GERD and this is mostly controled on pantoprazole 40 mg daily.  She will occasionally take a second dose around dinner.  A few time a year she will have significant epigastric sharp stabbing pains that last s for less than 5 minutes without clear cause.  She reports solid food dysphagia with beef, but liquids and medications cause no issue.  She notes right sided distress, but admits this may be related to her scoliosis.  The patient moves her bowels most days of the week with using miralax 3-4 times a week.  She reports straining, an external hemorrhoids, and blood on the tissue paper and sometimes mixed in with stool.  Denies melena.  She notes some abdominal bloating that improves with a BM.  Denies rectal pain.    The patient presents to the office in no acute distress.  The patient denies fevers, chills, nausea, vomiting, jaundice, icterus, or swelling.  She does not drink to excess.  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  She smokes cigars.    Allergies/Sensitivities:No Known Allergies (drug, envir, food or latex)    Medications:   Prior to  Admission medications    Medication Sig Start Date End Date Taking? Authorizing Provider   clindamycin (CLEOCIN T) 1 % lotion APPLY TO AFFECTED AREA(S) TO THE FACE AND BACK EVERY MORNING 03/28/22   Aniceto Boss, NP   clindamycin (CLINDAGEL) 1 % gel Apply topically daily  to the following areas: face, back 01/01/22   Aniceto Boss, NP   tretinoin (RETIN-A) 0.1 % cream Apply a pea sized amount to the entire face Monday/Wednesday/Friday. Can increase to nightly if not too drying. Can decrease to once or twice per week if too drying. 01/01/22   Aniceto Boss, NP   sucralfate (CARAFATE) 1 gm tablet TAKE 1 TABLET BY MOUTH TWO TIMES A DAY AS NEEDED. 04/17/21 04/16/22  Gaspar Bidding, PA   oxyCODONE-acetaminophen (PERCOCET) 5-325 MG per tablet Take 1 tablet by mouth up to 3 times daily as needed. Max daily dose: 3 tablets 05/04/20      pregabalin (LYRICA) 150 MG capsule Take 1 capsule (150 mg total) by mouth at bedtime as needed. Max daily dose: 150 mg 05/04/20      umeclidinium bromide (INCRUSE ELLIPTA) 62.5 MCG/INH AEPB INHALE 1 PUFF BY MOUTH ONCE DAILY. 04/07/20      pantoprazole (PROTONIX) 40 MG EC tablet TAKE 1 TABLET BY MOUTH TWO TIMES A DAY. (SWALLOW WHOLE: DO NOT CRUSH, BREAK OR CHEW) 04/07/20 04/06/21  Gaspar Bidding, PA   norethindrone (AYGESTIN) 5 MG tablet Take 2 tablets (  10 mg total) by mouth daily 04/06/20      naproxen (NAPROSYN) 500 MG tablet Take 1 tablet (500 mg total) by mouth with food or milk or up to 2 times daily as needed. 03/06/20      loratadine (CLARITIN) 10 MG tablet Take 1 tablet (10 mg total) by mouth daily for allergies. 03/06/20      fluticasone (FLONASE) 50 MCG/ACT nasal spray Use 1 spray into each nostril once daily. 03/06/20      Ferrous Sulfate 324 (65 Fe) MG TBEC Take 1 tablet (324 mg) by mouth every other day. 03/06/20      nicotine polacrilex (NICORETTE) 2 MG gum CHEW AND PARK 1 PIECE OF GUM FOR 30 MINUTES AS NEEDED UP TO 20 TIMES PER DAY. 03/06/20      nicotine (NICODERM CQ) 7  MG/24HR patch PLACE ONE PATCH ONTO THE SKIN ONCE DAILY. REMOVE AND DISCARD PATCH AFTER 24 HOURS. 03/06/20      Psyllium 43 % POWD Dissolve one teaspoonful in 8 ounces of liquid and drink by mouth once daily. 03/06/20      amLODIPine (NORVASC) 10 MG tablet Take 1 tablet (10 mg total) by mouth daily. 01/06/20      docusate sodium (COLACE) 100 MG capsule Take 1 capsule (100 mg total) by mouth up to 2 times daily as needed for constipation. 12/29/19      ibuprofen (ADVIL,MOTRIN) 600 MG tablet Take 1 tablet by mouth every 6 (six) hours as needed for Pain. 11/29/19      lidocaine (LIDODERM) 5 % patch Apply one patch to the most painful aspect of low back for up to 12 hours a day as needed for pain 11/12/19   Duffy Rhody, [place]   cloNIDine (CATAPRES) 0.1 MG tablet Take 2 tablets by mouth at bedtime, May also take 1 tablet twice daily for anxiety as directed   Patient not taking: Reported on 02/15/2020 11/03/19      albuterol HFA (PROVENTIL, VENTOLIN, PROAIR HFA) 108 (90 Base) MCG/ACT inhaler Inhale 2 puffs by mouth into the lungs every 6 hours as needed for shortness of breath. 10/29/19      Spacer/Aero-Holding Chambers (OPTICHAMBER DIAMOND) MISC Use as directed 10/29/19      beclomethasone HFA (QVAR REDIHALER) 40 MCG/ACT redihaler Inhale 1 puff by mouth into the lungs 2 times daily. 10/10/19      fluticasone (FLOVENT HFA) 110 MCG/ACT inhaler Inhale 1 puff by mouth into the lungs 2 times daily for COPD. 10/08/19 10/11/19     Medical marijuana as needed    [provider]   ondansetron (ZOFRAN-ODT) 4 MG disintegrating tablet Take 1 tablet (4 mg total) by mouth 3 times daily as needed   Place on top of tongue. 04/28/17   Garth Bigness, MD     Vitals:   Vitals:    08/06/22 0806   BP: (!) 145/94   Pulse: 66   Resp: 16   Temp: 35.2 C (95.4 F)   Weight: 64.5 kg (142 lb 4.8 oz)     Body mass index is 21.64 kg/m.     WEIGHTS     Weight (metric) Weight (standard)   08/17/2018 68.04 kg  150 lbs    08/24/2018 70.308 kg  155 lbs     09/14/2018 69.4 kg  153 lbs    09/22/2018 69.4 kg  153 lbs    10/05/2018 70.308 kg  155 lbs    10/23/2018 70.308 kg  155 lbs    11/27/2018  70.308 kg  155 lbs    05/07/2019 72.576 kg  160 lbs    06/07/2019 77.111 kg  170 lbs    07/09/2019 77.111 kg  170 lbs    10/20/2019 81.647 kg  180 lbs    11/12/2019 86.183 kg  190 lbs    11/16/2019 81.647 kg  180 lbs    12/03/2019 81.647 kg  180 lbs    01/20/2020 83.915 kg  185 lbs    02/15/2020 81.375 kg  179 lbs 6 oz    03/01/2020 81.194 kg  179 lbs    09/21/2020 79.379 kg  175 lbs     81.647 kg  180 lbs    01/01/2022 74.844 kg  165 lbs    03/29/2022 68.493 kg  151 lbs    08/06/2022 64.547 kg  142 lbs 5 oz      Physical Exam:   General: 46 year old female in NAD  HEENT: sclera anicteric. Oral mucosa pink and moist.   Lungs: Normal respiratory effort, clear bilaterally to auscultation.    Cor: RRR without murmur.    Abdomen: NABS, no distention, mild tenderness on right side. No hepatosplenomegaly, hernias, masses, or ascites.   Extr: Warm, well-perfused. No edema.    Neuro: Grossly no focal motor deficits.   Skin: No jaundice, rash, or ulcers on visualized skin.     GI Procedures:   EGD (10/07/2017):  Findings:     Esophagus:  Normal       Stomach:  Mild antral erythema. No ulcers or erosions   S/p distal and proximal biopsies       Duodenum/small bowel:  Normal     Intervention(s):  Cytology/biopsy: Biopsies taken of stomach     Complication(s):  none     Impression(s):  Mild gastric antral erythema  S/p gastric biopsies as above     Histopathologic diagnosis:  Stomach, biopsy:    - Antral and oxyntic mucosa with mild chronic gastritis, inactive.    - No Helicobacter organisms identified on H&E-stained sections.      Labs/Imaging:    04/20/21 13:07 07/23/21 09:27   Sodium 141 143   Potassium 4.2 4.5   Chloride 103 107   CO2 27 26   Anion Gap 11 10   UN 13 15   Creatinine 0.78 0.64   eGFR BY CREAT 95 110   Glucose 87 90   Calcium 9.5 9.7   Cholesterol  178   HDL Cholesterol  39 (L)   Hemoglobin  A1C  5.4     Impression(s)/Recommendation(s):   [name] is a 46 y.o. female with medical history of anemia, anxiety, arthritis, depression, fibromyalgia, GERD, HTN, cholecystectomy, scoliosis who presents for abdominal pain, rectal bleeding, weight loss, GERD.  EGD on 09/27/2017 noted mild antral erythema without erosions or ulcers.  Colonoscopy on 08/07/2016 was normal.  Suspect her rectal bleeding is secondary to a hemorrhoids and have advised fiber and more regular miralax use.  She is due for colon cancer surveillance and due to her unintentional weight loss and poor appetite, will add EGD to investigate for a malicious source.  Will also proceed with cross sectional imaging to assess biliary tree, pancreas and r/o abdominal malignancy.    Plan  - GERD lifetyle modifications  - Continue pantoaprzole 40 mg daily  - Advised daily fiber supplement  - Miralax 1 capful every other day (patient reports daily uses causes too much diarrhea)  - CT A/P with contrast  - EGD/Colonoscopy with  [place] (reports waking up during previous colonoscopy in 2017)    Follow up in 4 months    We thank you for allowing [place] to participate in this patient's care.  Please do not hesitate to contact us with any questions or concerns at [phone].      Tyrone Nine, [place]

## 2022-08-19 ENCOUNTER — Telehealth: Payer: Self-pay

## 2022-08-19 ENCOUNTER — Other Ambulatory Visit: Payer: Self-pay | Admitting: Dermatology

## 2022-08-19 DIAGNOSIS — L700 Acne vulgaris: Secondary | ICD-10-CM

## 2022-08-19 MED ORDER — TRETINOIN 0.1 % EX CREA *I*
TOPICAL_CREAM | CUTANEOUS | 3 refills | Status: AC
Start: 2022-08-19 — End: ?

## 2022-08-19 NOTE — Telephone Encounter (Signed)
RX Refill    Reason for Call:   Medication Question   Who is calling:    pharmacy  What Medication is Patient Requesting:   tretinoin  Provider:  Aniceto Boss  Last Visit in Dermatology:  03/29/22  Additional comments:   pharmacy calling about a refill of medication.

## 2022-08-20 ENCOUNTER — Telehealth: Payer: Self-pay | Admitting: Gastroenterology

## 2022-08-20 ENCOUNTER — Ambulatory Visit
Admission: RE | Admit: 2022-08-20 | Discharge: 2022-08-20 | Disposition: A | Discharge status: Home / Self Care | Payer: Medicaid (Managed Care) | Source: Ambulatory Visit | Attending: Gastroenterology | Admitting: Gastroenterology

## 2022-08-20 ENCOUNTER — Other Ambulatory Visit: Payer: Self-pay

## 2022-08-20 DIAGNOSIS — N1330 Unspecified hydronephrosis: Secondary | ICD-10-CM | POA: Insufficient documentation

## 2022-08-20 DIAGNOSIS — R1084 Generalized abdominal pain: Secondary | ICD-10-CM

## 2022-08-20 DIAGNOSIS — R634 Abnormal weight loss: Secondary | ICD-10-CM

## 2022-08-20 DIAGNOSIS — K5730 Diverticulosis of large intestine without perforation or abscess without bleeding: Secondary | ICD-10-CM | POA: Insufficient documentation

## 2022-08-20 LAB — POCT CREATININE
Creatinine, POCT: 0.8 mg/dL (ref 0.51–0.95)
eGFR BY CREAT: 91 *

## 2022-08-20 MED ORDER — IOHEXOL 350 MG/ML (OMNIPAQUE) IV SOLN *I*
1.0000 mL | Freq: Once | INTRAVENOUS | Status: AC
Start: 2022-08-20 — End: 2022-08-20
  Administered 2022-08-20: 100 mL via INTRAVENOUS

## 2022-08-20 MED ORDER — BARIUM SULFATE (REDI-CAT) 2 % PO SUSP *I*
900.0000 mL | Freq: Once | ORAL | Status: AC
Start: 2022-08-20 — End: 2022-08-20
  Administered 2022-08-20: 900 mL via ORAL

## 2022-08-20 NOTE — Telephone Encounter (Signed)
Called patient to go over CT scan results.  Advised no concerning GI findings.  Advised mild left hydronephrosis was seen.  Patient denies urinary issues at this time.  She reports a follow up with PCP next month and advised to discuss with their office.

## 2022-08-21 ENCOUNTER — Other Ambulatory Visit: Payer: Self-pay

## 2022-08-21 ENCOUNTER — Observation Stay
Admission: EM | Admit: 2022-08-21 | Discharge: 2022-08-22 | Disposition: A | Discharge status: Home / Self Care | Payer: Medicaid (Managed Care) | Source: Ambulatory Visit | Attending: Internal Medicine | Admitting: Internal Medicine

## 2022-08-21 ENCOUNTER — Encounter: Payer: Self-pay | Admitting: Emergency Medicine

## 2022-08-21 DIAGNOSIS — M545 Low back pain, unspecified: Secondary | ICD-10-CM | POA: Insufficient documentation

## 2022-08-21 DIAGNOSIS — F17210 Nicotine dependence, cigarettes, uncomplicated: Secondary | ICD-10-CM | POA: Insufficient documentation

## 2022-08-21 DIAGNOSIS — N1330 Unspecified hydronephrosis: Principal | ICD-10-CM | POA: Insufficient documentation

## 2022-08-21 DIAGNOSIS — R109 Unspecified abdominal pain: Secondary | ICD-10-CM | POA: Insufficient documentation

## 2022-08-21 DIAGNOSIS — Z5982 Transportation insecurity: Secondary | ICD-10-CM

## 2022-08-21 DIAGNOSIS — Z5941 Food insecurity: Secondary | ICD-10-CM

## 2022-08-21 DIAGNOSIS — Z716 Tobacco abuse counseling: Secondary | ICD-10-CM | POA: Insufficient documentation

## 2022-08-21 LAB — RUQ PANEL (ED ONLY)
ALT: 10 U/L (ref 0–35)
AST: 19 U/L (ref 0–35)
Albumin: 4 g/dL (ref 3.5–5.2)
Alk Phos: 56 U/L (ref 35–105)
Amylase: 83 U/L (ref 28–100)
Bilirubin,Direct: <0.2 mg/dL (ref 0.0–0.3)
Bilirubin,Total: <0.2 mg/dL (ref 0.0–1.2)
Lipase: 50 U/L (ref 13–60)
Total Protein: 7.6 g/dL (ref 6.3–7.7)

## 2022-08-21 LAB — BASIC METABOLIC PANEL
Anion Gap: 11 (ref 7–16)
CO2: 28 mmol/L (ref 20–28)
Calcium: 9.7 mg/dL (ref 8.6–10.2)
Chloride: 103 mmol/L (ref 96–108)
Creatinine: 0.88 mg/dL (ref 0.51–0.95)
Glucose: 163 mg/dL — ABNORMAL HIGH (ref 60–99)
Lab: 19 mg/dL (ref 6–20)
Potassium: 3.9 mmol/L (ref 3.3–5.1)
Sodium: 142 mmol/L (ref 133–145)
eGFR BY CREAT: 81 *

## 2022-08-21 LAB — CBC AND DIFFERENTIAL
Baso # K/uL: 0 10*3/uL (ref 0.0–0.2)
Basophil %: 0.3 %
Eos # K/uL: 0.1 10*3/uL (ref 0.0–0.5)
Eosinophil %: 0.4 %
Hematocrit: 35 % (ref 34–49)
Hemoglobin: 11.7 g/dL (ref 11.2–16.0)
IMM Granulocytes #: 0 10*3/uL (ref 0.0–0.0)
IMM Granulocytes: 0.3 %
Lymph # K/uL: 1.8 10*3/uL (ref 1.0–5.0)
Lymphocyte %: 14 %
MCH: 29 pg (ref 26–32)
MCHC: 34 g/dL (ref 32–36)
MCV: 85 fL (ref 75–100)
Mono # K/uL: 0.8 10*3/uL (ref 0.1–1.0)
Monocyte %: 6.5 %
Neut # K/uL: 10 10*3/uL — ABNORMAL HIGH (ref 1.5–6.5)
Nucl RBC # K/uL: 0 10*3/uL (ref 0.0–0.0)
Nucl RBC %: 0 /100 WBC (ref 0.0–0.2)
Platelets: 246 10*3/uL (ref 150–450)
RBC: 4.1 MIL/uL (ref 4.0–5.5)
RDW: 16.1 % — ABNORMAL HIGH (ref 0.0–15.0)
Seg Neut %: 78.5 %
WBC: 12.7 10*3/uL — ABNORMAL HIGH (ref 3.5–11.0)

## 2022-08-21 MED ORDER — SODIUM CHLORIDE 0.9 % FLUSH FOR PUMPS *I*
0.0000 mL/h | INTRAVENOUS | Status: DC | PRN
Start: 2022-08-21 — End: 2022-08-22

## 2022-08-21 MED ORDER — SODIUM CHLORIDE 0.9 % IV BOLUS *I*
1000.0000 mL | Freq: Once | Status: AC
Start: 2022-08-22 — End: 2022-08-22
  Administered 2022-08-22: 1000 mL via INTRAVENOUS

## 2022-08-21 MED ORDER — DEXTROSE 5 % FLUSH FOR PUMPS *I*
0.0000 mL/h | INTRAVENOUS | Status: DC | PRN
Start: 2022-08-21 — End: 2022-08-22

## 2022-08-21 NOTE — ED Triage Notes (Signed)
Pt arrives via EMS from home, reports she had CT done yesterday, which showed mild L hydronephrosis, advised by GI who ordered the CT scan to follow up with PCP next month regarding the CT. Reports L flank pain that started yesterday. Denied urinary sx or issues when speaking to GI yesterday.

## 2022-08-21 NOTE — Telephone Encounter (Signed)
CT scan faxed to PCP

## 2022-08-22 ENCOUNTER — Encounter: Payer: Self-pay | Admitting: Emergency Medicine

## 2022-08-22 ENCOUNTER — Observation Stay: Payer: Medicaid (Managed Care)

## 2022-08-22 ENCOUNTER — Emergency Department: Payer: Medicaid (Managed Care)

## 2022-08-22 DIAGNOSIS — R109 Unspecified abdominal pain: Secondary | ICD-10-CM

## 2022-08-22 DIAGNOSIS — Z9049 Acquired absence of other specified parts of digestive tract: Secondary | ICD-10-CM

## 2022-08-22 DIAGNOSIS — D739 Disease of spleen, unspecified: Secondary | ICD-10-CM

## 2022-08-22 DIAGNOSIS — N289 Disorder of kidney and ureter, unspecified: Secondary | ICD-10-CM

## 2022-08-22 DIAGNOSIS — M549 Dorsalgia, unspecified: Secondary | ICD-10-CM

## 2022-08-22 DIAGNOSIS — N1330 Unspecified hydronephrosis: Secondary | ICD-10-CM

## 2022-08-22 LAB — URINALYSIS REFLEX TO CULTURE
Blood,UA: NEGATIVE
Glucose,UA: NEGATIVE
Leuk Esterase,UA: NEGATIVE
Nitrite,UA: NEGATIVE
Specific Gravity,UA: 1.022 (ref 1.002–1.030)
pH,UA: 7.5 (ref 5.0–8.0)

## 2022-08-22 LAB — PROTIME-INR
INR: 1.2 — ABNORMAL HIGH (ref 0.9–1.1)
Protime: 13.8 s — ABNORMAL HIGH (ref 10.0–12.9)

## 2022-08-22 LAB — CBC AND DIFFERENTIAL
Baso # K/uL: 0 10*3/uL (ref 0.0–0.2)
Basophil %: 0.2 %
Eos # K/uL: 0 10*3/uL (ref 0.0–0.5)
Eosinophil %: 0 %
Hematocrit: 32 % — ABNORMAL LOW (ref 34–49)
Hemoglobin: 11.2 g/dL (ref 11.2–16.0)
IMM Granulocytes #: 0 10*3/uL (ref 0.0–0.0)
IMM Granulocytes: 0.3 %
Lymph # K/uL: 1.6 10*3/uL (ref 1.0–5.0)
Lymphocyte %: 14.4 %
MCH: 29 pg (ref 26–32)
MCHC: 35 g/dL (ref 32–36)
MCV: 84 fL (ref 75–100)
Mono # K/uL: 0.6 10*3/uL (ref 0.1–1.0)
Monocyte %: 5.5 %
Neut # K/uL: 8.7 10*3/uL — ABNORMAL HIGH (ref 1.5–6.5)
Nucl RBC # K/uL: 0 10*3/uL (ref 0.0–0.0)
Nucl RBC %: 0 /100 WBC (ref 0.0–0.2)
Platelets: 238 10*3/uL (ref 150–450)
RBC: 3.9 MIL/uL — ABNORMAL LOW (ref 4.0–5.5)
RDW: 15.9 % — ABNORMAL HIGH (ref 0.0–15.0)
Seg Neut %: 79.6 %
WBC: 10.9 10*3/uL (ref 3.5–11.0)

## 2022-08-22 LAB — COMPREHENSIVE METABOLIC PANEL
ALT: 9 U/L (ref 0–35)
AST: 18 U/L (ref 0–35)
Albumin: 3.8 g/dL (ref 3.5–5.2)
Alk Phos: 53 U/L (ref 35–105)
Anion Gap: 11 (ref 7–16)
Bilirubin,Total: 0.3 mg/dL (ref 0.0–1.2)
CO2: 24 mmol/L (ref 20–28)
Calcium: 9 mg/dL (ref 8.6–10.2)
Chloride: 105 mmol/L (ref 96–108)
Creatinine: 0.71 mg/dL (ref 0.51–0.95)
Glucose: 110 mg/dL — ABNORMAL HIGH (ref 60–99)
Lab: 17 mg/dL (ref 6–20)
Potassium: 3.9 mmol/L (ref 3.3–5.1)
Sodium: 140 mmol/L (ref 133–145)
Total Protein: 6.8 g/dL (ref 6.3–7.7)
eGFR BY CREAT: 105 *

## 2022-08-22 LAB — URINE MICROSCOPIC (IQ200)

## 2022-08-22 LAB — APTT: aPTT: 35.6 s (ref 25.8–37.9)

## 2022-08-22 MED ORDER — ONDANSETRON HCL 2 MG/ML IV SOLN *I*
4.0000 mg | Freq: Once | INTRAMUSCULAR | Status: AC
Start: 2022-08-22 — End: 2022-08-22
  Administered 2022-08-22: 4 mg via INTRAVENOUS
  Filled 2022-08-22: qty 2

## 2022-08-22 MED ORDER — SODIUM CHLORIDE 0.9 % IV BOLUS *I*
1000.0000 mL | Freq: Once | Status: AC
Start: 2022-08-22 — End: 2022-08-22
  Administered 2022-08-22: 1000 mL via INTRAVENOUS

## 2022-08-22 MED ORDER — ONDANSETRON HCL 2 MG/ML IV SOLN *I*
4.0000 mg | Freq: Four times a day (QID) | INTRAMUSCULAR | Status: DC | PRN
Start: 2022-08-22 — End: 2022-08-22
  Administered 2022-08-22: 4 mg via INTRAVENOUS
  Filled 2022-08-22: qty 2

## 2022-08-22 MED ORDER — OXYCODONE HCL 5 MG PO TABS *I*
5.0000 mg | ORAL_TABLET | Freq: Once | ORAL | Status: AC
Start: 2022-08-22 — End: 2022-08-22
  Administered 2022-08-22: 5 mg via ORAL
  Filled 2022-08-22: qty 1

## 2022-08-22 MED ORDER — OXYCODONE HCL 5 MG PO TABS *I*
5.0000 mg | ORAL_TABLET | ORAL | Status: DC | PRN
Start: 2022-08-22 — End: 2022-08-22
  Administered 2022-08-22 (×2): 5 mg via ORAL
  Filled 2022-08-22 (×2): qty 1

## 2022-08-22 MED ORDER — SODIUM CHLORIDE 0.9 % IV SOLN WRAPPED *I*
125.0000 mL/h | Status: DC
Start: 2022-08-22 — End: 2022-08-22
  Administered 2022-08-22: 125 mL/h via INTRAVENOUS

## 2022-08-22 NOTE — ED Obs Notes (Addendum)
ADMISSION NOTE [facility] - LOCATION: EOU    Patient seen by me today, 08/22/2022 at 11:13 AM    History     The patient is a 46 y.o. female who has been placed on the EOU service after evaluation in the [facility] Emergency Department for Flank Pain.    [name] is a 46 y.o. female with past medical history significant for hypertension, chronic low back pain secondary to significant scoliosis, sciatica, GERD and peptic ulcer, fibromyalgia, migraines, anxiety, nicotine dependence, OSA, who presented with nausea, vomiting, diarrhea and an extension of her normal chronic right-sided back pain to bilateral back pain  She recently started new exercises  She has not recently had any new footwear  Exercises were undertaken in an attempt to correct her scoliosis    A recent work-up for weight loss included a CT scan which demonstrated left-sided hydronephrosis  Her left-sided back pain also started after being notified of the left-sided hydronephrosis    Her nausea vomiting and diarrhea started after intake of a large meal after a period of inconsistent food access        Past Medical History:   Diagnosis Date    Anemia     Anxiety 01/12/2016    Arthritis     Depression     DJD (degenerative joint disease), lumbar     small disc disease    DUB (dysfunctional uterine bleeding)     Fibromyalgia 01/12/2016    GERD (gastroesophageal reflux disease)     High blood pressure     Hypertension     Nicotine dependence 01/12/2016    Peptic ulcer     Sciatica of right side     Sleep apnea        Past Surgical History:   Procedure Laterality Date    ANTERIOR CRUCIATE LIGAMENT REPAIR Right 01/24/2015    CESAREAN SECTION, LOW TRANSVERSE  2007    CHOLECYSTECTOMY  1996    COLONOSCOPY      PR ARTHROSCOPY KNEE DIAGNOSTIC W/WO SYNOVIAL BX SPX Right 08/17/2018    Procedure: KNEE ARTHROSCOPY DIAGNOSTIC;  Surgeon: Arvil Persons, MD;  Location: [facility];  Service: Orthopedics    PR ARTHROSCOPY KNEE SYNOVECTOMY LIMITED SPX Right  08/17/2018    Procedure: KNEE ARTHROSCOPY WITH SYNOVECTOMY;  Surgeon: Arvil Persons, MD;  Location: [facility];  Service: Orthopedics    PR ARTHRS KNE SURG W/MENISCECTOMY MED/LAT W/SHVG Left 09/11/2016    Procedure: LEFT KNEE ARTHROSCOPY WITH PARTIAL LATERAL MENISCECTOMY, CHONDROPLASTY    ;  Surgeon: Arvil Persons, MD;  Location: [facility];  Service: Orthopedics    PR ARTHRS KNEE DEBRIDEMENT/SHAVING ARTCLR CRTLG Right 08/17/2018    Procedure: RIGHT KNEE ARTHROSCOPY WITH CHONDROPLASTY;  Surgeon: Arvil Persons, MD;  Location: [facility];  Service: Orthopedics       Family History   Problem Relation Age of Onset    Brain cancer Mother     Colon cancer Father 68    Asthma Brother        Social History      reports that she has been smoking cigars and cigarettes. She has been smoking an average of .5 packs per day. She has never used smokeless tobacco.  Drug: Marijuana. She reports being sexually active and has had partner(s) who are female. She reports using the following method of birth control/protection: Surgical. She reports that she does not drink alcohol.     Living Situation       Questions  Responses    Patient lives with     Homeless     Caregiver for other family member     External Services     Employment     Domestic Violence Risk              Physical Exam   BP 133/87 (BP Location: Left arm)   Pulse 66   Temp 36.6 C (97.9 F) (Temporal)   Resp 16   Ht 1.702 m (5\' 7" )   Wt 68 kg (150 lb)   SpO2 99%   BMI 23.49 kg/m     Physical Exam  Constitutional:       General: She is not in acute distress.     Appearance: She is not ill-appearing.   HENT:      Head: Atraumatic.      Right Ear: External ear normal.      Left Ear: External ear normal.      Nose: No rhinorrhea.      Mouth/Throat:      Mouth: Mucous membranes are moist.   Eyes:      General: No scleral icterus.        Right eye: No discharge.         Left eye: No discharge.      Conjunctiva/sclera: Conjunctivae normal.   Cardiovascular:       Rate and Rhythm: Normal rate and regular rhythm.      Heart sounds: Normal heart sounds. No murmur heard.  Pulmonary:      Effort: Pulmonary effort is normal. No respiratory distress.      Breath sounds: Normal breath sounds.   Abdominal:      General: Bowel sounds are normal. There is no distension.      Tenderness: There is no abdominal tenderness.   Musculoskeletal:      Right lower leg: No edema.      Left lower leg: No edema.      Comments: Scoliosis evident, superficial musculoskeletal pain as well as deeper pain over the lumbar region bilaterally   Skin:     General: Skin is warm and dry.      Coloration: Skin is not pale.   Neurological:      General: No focal deficit present.      Mental Status: She is alert and oriented to person, place, and time.      Coordination: Coordination normal.      Gait: Gait normal.   Psychiatric:         Mood and Affect: Mood normal.         Behavior: Behavior normal.         Thought Content: Thought content normal.         Tests     EKG interpretation: N/A    Telemetry interpretation: N/A     Labs:  All labs in the last 24 hours: Normal electrolytes, normal renal function, normal kidney function  Mild anemia, otherwise normal CBC  Urinalysis not suggestive of infection  Recent Results (from the past 24 hour(s))   CBC and differential    Collection Time: 08/21/22 10:16 PM   Result Value Ref Range    WBC 12.7 (H) 3.5 - 11.0 THOU/uL    RBC 4.1 4.0 - 5.5 MIL/uL    Hemoglobin 11.7 11.2 - 16.0 g/dL    Hematocrit 35 34 - 49 %    MCV 85 75 - 100 fL    MCH 29 26 - 32  pg    MCHC 34 32 - 36 g/dL    RDW 16.1 (H) 0.0 - 09.6 %    Platelets 246 150 - 450 THOU/uL    Seg Neut % 78.5 %    Lymphocyte % 14.0 %    Monocyte % 6.5 %    Eosinophil % 0.4 %    Basophil % 0.3 %    Neut # K/uL 10.0 (H) 1.5 - 6.5 THOU/uL    Lymph # K/uL 1.8 1.0 - 5.0 THOU/uL    Mono # K/uL 0.8 0.1 - 1.0 THOU/uL    Eos # K/uL 0.1 0.0 - 0.5 THOU/uL    Baso # K/uL 0.0 0.0 - 0.2 THOU/uL    Nucl RBC % 0.0 0.0 - 0.2 /100  WBC    Nucl RBC # K/uL 0.0 0.0 - 0.0 THOU/uL    IMM Granulocytes # 0.0 0.0 - 0.0 THOU/uL    IMM Granulocytes 0.3 %   Basic metabolic panel    Collection Time: 08/21/22 10:16 PM   Result Value Ref Range    Glucose 163 (H) 60 - 99 mg/dL    Sodium 045 409 - 811 mmol/L    Potassium 3.9 3.3 - 5.1 mmol/L    Chloride 103 96 - 108 mmol/L    CO2 28 20 - 28 mmol/L    Anion Gap 11 7 - 16    UN 19 6 - 20 mg/dL    Creatinine 9.14 7.82 - 0.95 mg/dL    eGFR BY CREAT 81 *    Calcium 9.7 8.6 - 10.2 mg/dL   RUQ panel (ED only)    Collection Time: 08/21/22 10:16 PM   Result Value Ref Range    Amylase 83 28 - 100 U/L    Lipase 50 13 - 60 U/L    Total Protein 7.6 6.3 - 7.7 g/dL    Albumin 4.0 3.5 - 5.2 g/dL    Bilirubin,Total <9.5 0.0 - 1.2 mg/dL    Bili,Indirect see below 0.1 - 1.0 mg/dL    Bilirubin,Direct <6.2 0.0 - 0.3 mg/dL    Alk Phos 56 35 - 130 U/L    AST 19 0 - 35 U/L    ALT 10 0 - 35 U/L   Urinalysis reflex to culture    Collection Time: 08/22/22  4:34 AM   Result Value Ref Range    Color, UA Yellow Yellow    Appearance,UR Clear Clear    Specific Gravity,UA 1.022 1.002 - 1.030    Leuk Esterase,UA NEG NEGATIVE    Nitrite,UA NEG NEGATIVE    pH,UA 7.5 5.0 - 8.0    Protein,UA Trace (!) NEGATIVE    Glucose,UA NEG NEGATIVE    Ketones, UA Trace (!) NEGATIVE    Blood,UA NEG NEGATIVE   Urine microscopic (iq200)    Collection Time: 08/22/22  4:34 AM   Result Value Ref Range    RBC,UA 0-2 0 - 2 /hpf    WBC,UA 0-5 0 - 5 /hpf    Bacteria,UA 1+ None Seen - 1+    Squam Epithel,UA 4+ (!) 0-1+ /lpf   Protime-INR    Collection Time: 08/22/22  6:22 AM   Result Value Ref Range    Protime 13.8 (H) 10.0 - 12.9 sec    INR 1.2 (H) 0.9 - 1.1   APTT    Collection Time: 08/22/22  6:22 AM   Result Value Ref Range    aPTT 35.6 25.8 - 37.9 sec   Comprehensive  metabolic panel    Collection Time: 08/22/22  6:22 AM   Result Value Ref Range    Sodium 140 133 - 145 mmol/L    Potassium 3.9 3.3 - 5.1 mmol/L    Chloride 105 96 - 108 mmol/L    CO2 24 20 - 28 mmol/L     Anion Gap 11 7 - 16    UN 17 6 - 20 mg/dL    Creatinine 1.61 0.96 - 0.95 mg/dL    eGFR BY CREAT 045 *    Glucose 110 (H) 60 - 99 mg/dL    Calcium 9.0 8.6 - 40.9 mg/dL    Total Protein 6.8 6.3 - 7.7 g/dL    Albumin 3.8 3.5 - 5.2 g/dL    Bilirubin,Total 0.3 0.0 - 1.2 mg/dL    AST 18 0 - 35 U/L    ALT 9 0 - 35 U/L    Alk Phos 53 35 - 105 U/L   CBC and differential    Collection Time: 08/22/22  6:22 AM   Result Value Ref Range    WBC 10.9 3.5 - 11.0 THOU/uL    RBC 3.9 (L) 4.0 - 5.5 MIL/uL    Hemoglobin 11.2 11.2 - 16.0 g/dL    Hematocrit 32 (L) 34 - 49 %    MCV 84 75 - 100 fL    MCH 29 26 - 32 pg    MCHC 35 32 - 36 g/dL    RDW 81.1 (H) 0.0 - 91.4 %    Platelets 238 150 - 450 THOU/uL    Seg Neut % 79.6 %    Lymphocyte % 14.4 %    Monocyte % 5.5 %    Eosinophil % 0.0 %    Basophil % 0.2 %    Neut # K/uL 8.7 (H) 1.5 - 6.5 THOU/uL    Lymph # K/uL 1.6 1.0 - 5.0 THOU/uL    Mono # K/uL 0.6 0.1 - 1.0 THOU/uL    Eos # K/uL 0.0 0.0 - 0.5 THOU/uL    Baso # K/uL 0.0 0.0 - 0.2 THOU/uL    Nucl RBC % 0.0 0.0 - 0.2 /100 WBC    Nucl RBC # K/uL 0.0 0.0 - 0.0 THOU/uL    IMM Granulocytes # 0.0 0.0 - 0.0 THOU/uL    IMM Granulocytes 0.3 %     Imaging results:   US renal retroperitoneal limited    Result Date: 08/22/2022  08/22/2022 8:33 AM US RENAL RETROPERITONEAL LIMITED CLINICAL INFORMATION:  left hydronephrosis, assess for stone. COMPARISON: CT abdomen and pelvis 08/22/2022.Marland Kitchen TECHNIQUE:  Sonographic evaluation of the left kidney and urinary bladder was performed using grayscale ultrasound. FINDINGS: Left Kidney: 13.2 cm in length. Echotexture normal. Moderate hydronephrosis. Bladder: Echogenic focus with posterior shadowing at the left ureteropelvic junction measuring 5 mm which may represent a stone. The urinary bladder is under distended and unremarkable. Urinary bladder volume is 39.3 mL.     1.Moderate left-sided hydronephrosis. 2.Echogenic focus with posterior shadowing at the left ureteropelvic junction measuring 5 mm which may  represent a stone. END OF IMPRESSION       [facility] submits this DICOM format image data and final report to the [facility], an independent secure electronic health information exchange, on a reciprocally searchable basis (with patient authorization) for a minimum of 12 months after exam date.    CT abdomen and pelvis without contrast    Result Date: 08/22/2022  08/22/2022 12:30 AM CT ABDOMEN AND PELVIS WITHOUT  CONTRAST CLINICAL INFORMATION:  Abdominal/flank pain, stone suspected. COMPARISON: CT of abdomen and pelvis 08/20/2022. PROCEDURE:  Contiguous images were obtained through the abdomen and pelvis without intravenous contrast.  Automated exposure control, adjustment of the mA and/or kV according to patient size, and/or iterative reconstruction techniques were utilized for radiation dose optimization. FINDINGS: Chest Base: Minimal bibasilar atelectasis. Liver/Biliary Tract: Status post cholecystectomy. Riedel's lobe is noted Pancreas: Unremarkable. Spleen: The previously seen subcentimeter hypodensity in the posterior spleen is not well visualized on this noncontrast exam. Adrenals: Unremarkable. Kidneys and Collecting Systems: Left extrarenal pelvis with severe hydronephrosis, tapers at the UPJ, increased in comparison with prior exam. No obstructing calculi is identified. Lymph Nodes: Limited evaluation without IV contrast.. Vessels: Unremarkable for age. GI Tract/Mesentery and Peritoneal Cavity: No evidence of bowel obstruction moderate colonic stool burden. No free air no significant free fluid. Visualized Reproductive Organs: IUD within the uterus. Bladder: Nondistended, limits evaluation. Soft Tissues/Musculoskeletal: Moderate thoracolumbar dextroscoliosis apex at T12-L1.     Severe left hydronephrosis tapers at the UPJ, increased in comparison with recent exam on 08/20/2022. No obstructive calculi is identified. END OF IMPRESSION I have personally reviewed the images and the Resident's/Fellow's  interpretation and agree with or edited the findings.       [facility] submits this DICOM format image data and final report to the [facility], an independent secure electronic health information exchange, on a reciprocally searchable basis (with patient authorization) for a minimum of 12 months after exam date.    CT abdomen and pelvis with contrast    Result Date: 08/20/2022  08/20/2022 8:35 AM CT ABDOMEN AND PELVIS WITH CONTRAST CLINICAL INFORMATION:  abdominal pain, weight loss, R10.84-Generalized abdominal ZOXWR60.4-Abnormal weight loss. COMPARISON: 04/27/2017 PROCEDURE:  Contiguous images were obtained through the abdomen and pelvis with intravenous contrast. Automated exposure control, adjustment of the mA and/or kV according to patient size, and/or iterative reconstruction techniques were utilized for radiation dose optimization.  Amount and type of contrast that was injected and/or discarded is recorded in the electronic medical record. FINDINGS: Chest Base: Dependent atelectasis. Liver/Biliary Tract: Status post cholecystectomy. Pancreas: Unremarkable. Spleen: Subcentimeter hypodensity posteriorly in the spleen series 2 image 19 is stable, too small to characterize. Adrenals: Unremarkable. Kidneys and Collecting Systems: Left extrarenal pelvis with mild hydronephrosis. Subcentimeter hypodensity lower pole left kidney is too small to characterize. Lymph Nodes: No lymphadenopathy. Vessels: Unremarkable for age. GI Tract/Mesentery and Peritoneal Cavity: Few scattered colonic diverticula noted. No ascites or inflammatory changes involve the bowel.Sharlotte Alamo Reproductive Organs: IUD within the uterine cavity. Bladder: Unremarkable. Soft Tissues/Musculoskeletal: Thoracolumbar scoliosis convex right at the thoracolumbar junction.     Left extrarenal pelvis and mild left hydronephrosis suggesting UPJ configuration. No obstructing calculus. Subcentimeter hypodensity lower pole left kidney is too small to  characterize. Scattered colonic diverticula END OF IMPRESSION       [facility] submits this DICOM format image data and final report to the [facility], an independent secure electronic health information exchange, on a reciprocally searchable basis (with patient authorization) for a minimum of 12 months after exam date.       Independent interpretation of imaging:  Reviewed with radiologist assistance, hydronephrosis noted both on images from this month as well as in April 7 months ago, this year    Medical Decision Making        Assessment:    46 y.o. female has been placed on the EOU service for Flank Pain    Differential Diagnosis includes called to determine  if echogenicity is due to phleboliths versus kidney stones                    Problems addressed and plan:   Lower back pain, likely musculoskeletal in nature  Encouraged to use home medications including Percocet, as well as muscle relaxers    Hydronephrosis with normal renal function  Okay to follow-up with primary care after discharge    Discussions with Other Providers: Radiology  Consultant teams previously spoken with by prior Providers: N/A    Parenteral controlled substances: None  Review of external/existing labs/records: Done  DVT prophylaxis: SVCD  Smoking Cessation: 3+ minutes of smoking cessation counseling done. Patient co-morbities include HTN.  Risks of smoking reviewed including possible lung disease (COPD, cancer) and cardiac disease.  Options of cessation aides reviewed including nicotine patch, hypnosis and possible prescription medications.  Patient encouraged to follow-up with outpatient MD.   Code Status: Full  COVID-19 status: N/A    SDoH affecting care (Long term issues affecting a patient's overall health):   Low income, unemployment, Difficulty with transportation, and Food insecurity    Disposition Barriers (More acute issues that are limiting a patient's discharge today):   Difficulty with transportation  home                Author: Jacki Cones, MD     Neville Walston, Darol Destine, MD  08/22/22 1312       Ludwika Rodd, Darol Destine, MD  08/22/22 1324

## 2022-08-22 NOTE — ED Provider Notes (Addendum)
History     Chief Complaint   Patient presents with    Flank Pain     Chief complaint: 10 of 10 pain, vomiting  HPI: 46 year old female with past medical history of fibromyalgia, severe sciatica/scoliosis, peptic ulcer disease and anxiety presents the emergency department with 10 out of 10 flank pain and vomiting.  She tells me she gets her prescription for pain meds tomorrow making it seem like she may have run out early.  This could happen in the setting of her vomiting and trying to take a pill after vomiting.  She was being worked up for weight loss with GI and had an outpatient CT scan done that showed mild hydronephrosis on the left.  GI called and let her know about this at which time she denied having pain in that area.  No fevers or chills.      History provided by:  Patient and medical records  Language interpreter used: No          Medical/Surgical/Family History     Past Medical History:   Diagnosis Date    Anemia     Anxiety 01/12/2016    Arthritis     Depression     DJD (degenerative joint disease), lumbar     small disc disease    DUB (dysfunctional uterine bleeding)     Fibromyalgia 01/12/2016    GERD (gastroesophageal reflux disease)     High blood pressure     Hypertension     Nicotine dependence 01/12/2016    Peptic ulcer     Sciatica of right side     Sleep apnea         Patient Active Problem List   Diagnosis Code    HTN (hypertension) I10    DJD (degenerative joint disease), lumbar M47.816    Chronic low back pain M54.50, G89.29    Sciatica of right side M54.31    Scoliosis of lumbar spine M41.9    Depression, unspecified depression type F32.A    Peptic ulcer K27.9    Anemia, unspecified type D64.9    DUB (dysfunctional uterine bleeding) N93.8    Fibromyalgia M79.7    Migraines G43.909    Anxiety F41.9    Nicotine dependence F17.200    OSA (obstructive sleep apnea) G47.33    Acute lateral meniscus tear of left knee S83.282A    Acne vulgaris L70.0            Past Surgical History:   Procedure  Laterality Date    ANTERIOR CRUCIATE LIGAMENT REPAIR Right 01/24/2015    CESAREAN SECTION, LOW TRANSVERSE  2007    CHOLECYSTECTOMY  1996    COLONOSCOPY      PR ARTHROSCOPY KNEE DIAGNOSTIC W/WO SYNOVIAL BX SPX Right 08/17/2018    Procedure: KNEE ARTHROSCOPY DIAGNOSTIC;  Surgeon: Arvil Persons, MD;  Location: [facility];  Service: Orthopedics    PR ARTHROSCOPY KNEE SYNOVECTOMY LIMITED SPX Right 08/17/2018    Procedure: KNEE ARTHROSCOPY WITH SYNOVECTOMY;  Surgeon: Arvil Persons, MD;  Location: [facility];  Service: Orthopedics    PR ARTHRS KNE SURG W/MENISCECTOMY MED/LAT W/SHVG Left 09/11/2016    Procedure: LEFT KNEE ARTHROSCOPY WITH PARTIAL LATERAL MENISCECTOMY, CHONDROPLASTY    ;  Surgeon: Arvil Persons, MD;  Location: [facility];  Service: Orthopedics    PR ARTHRS KNEE DEBRIDEMENT/SHAVING ARTCLR CRTLG Right 08/17/2018    Procedure: RIGHT KNEE ARTHROSCOPY WITH CHONDROPLASTY;  Surgeon: Arvil Persons, MD;  Location: [facility];  Service: Orthopedics  Social History     Tobacco Use    Smoking status: Every Day     Packs/day: 0.50     Types: Cigars, Cigarettes    Smokeless tobacco: Never   Substance Use Topics    Alcohol use: No             Review of Systems    Physical Exam     Triage Vitals  Triage Start: Start, (08/21/22 2150)  First Recorded BP: 127/81, Resp: 20, Temp: 36.4 C (97.5 F), Temp src: TEMPORAL Oxygen Therapy SpO2: 98 %, O2 Device: None (Room air), Heart Rate: 74, (08/21/22 2153)  .  First Pain Reported  0-10 Scale: 10, Pain Location/Orientation: Abdomen, (08/21/22 2153)       Physical Exam  Vitals and nursing note reviewed.   Constitutional:       General: She is not in acute distress.     Appearance: She is well-developed. She is not diaphoretic.      Comments: Appears uncomfortable   HENT:      Head: Normocephalic and atraumatic.   Cardiovascular:      Rate and Rhythm: Normal rate.   Pulmonary:      Effort: Pulmonary effort is normal.   Abdominal:      General: Bowel sounds  are normal. There is no distension.      Palpations: Abdomen is soft.   Musculoskeletal:         General: No deformity.   Skin:     General: Skin is warm and dry.      Findings: No rash.   Neurological:      Mental Status: She is alert and oriented to person, place, and time.   Psychiatric:         Behavior: Behavior normal.         Thought Content: Thought content normal.         Judgment: Judgment normal.         Medical Decision Making   Patient seen by me on:  08/21/2022    Assessment:  46 year old female presents emergency department with flank pain    Differential diagnosis:  Reviewed CT scan.  Patient had a thoracic CT several months ago on which I can see the kidney-it appears that there was a similar configuration at that time  Will reimage to see if there is some sort of obstruction  Check labs for dehydration, electrolyte abnormalities  Check urine for blood, infection    Plan:  Orders Placed This Encounter      CT abdomen and pelvis without contrast      CBC and differential      Basic metabolic panel      RUQ panel (ED only)      Urinalysis with reflex to Microscopic UA and reflex to Bacterial Culture      Insert peripheral IV    Fluids, Zofran, home oxycodone    Review of existing & external labs / records: Collateral information collected from recent GI visits, imaging and phone call with GI    Other MDM elements: Labs with mild elevation white count but no significant shift.  Suspect this is hemoconcentration from vomiting.  Electrolytes reassuring.  Right upper quadrant panel unremarkable.    ED Course and Disposition:  CT scan shows increased hydronephrosis.  Still awaiting urine.  Given this change we will plan for at least short stay admission.  Ultrasound in the morning as recommended.  Possible urology consult based on results.  UA unremarkable.              Harrietta Guardian, MD            Harrietta Guardian, MD  08/22/22 0981       Harrietta Guardian, MD  08/22/22 [phone]

## 2022-08-22 NOTE — Discharge Instructions (Signed)
Please take your home pain medication and muscle relaxers as directed  Please hold off on any new exercises that might strain your back    In the coming weeks please follow-up with your primary care physician to monitor your kidney function and kidney swelling    Please return to the emergency department if you have return or worsening of symptoms

## 2022-08-22 NOTE — ED Notes (Incomplete)
Report Given To  ED RN       Descriptive Sentence / Reason for Admission     Chief Complaint/Presenting Symptoms: ABD pain, V/V    Admitting Diagnosis:  Hydronephrosis         Active Issues / Relevant Events     Orientation Status: AOx4    Ambulates with: IND    Skin/wounds/specialty bed needs:     Precautions: Standard     O2 needs: RA    PO status/How pt takes pills: Whole     Any meds or valuables with patients:     Psychosocial/Behavior/1:1 needs:     Mode of Transport to IP (pt is in stretcher/wheelchair/IP bed):  WC        To Do List  To Do    []  VS Q4  []  Pain management   []  Control Emesis   []   [place] in AM        Anticipatory Guidance / Discharge Planning  Admit

## 2022-08-22 NOTE — ED Notes (Signed)
08/22/22 0733   Observation Care   Observation care initiated  Yes   Patient has been verbally notified of their observation status Yes

## 2022-08-22 NOTE — Progress Notes (Signed)
08/22/22 2003   [facility] Patient Class Review   Patient Class Review Observation     Patient Class effective   Start   Ordered   08/22/22            Rise Mu, BSN, RN-BC  [facility] - Utilization Management   [address]   [place], [place]  [address]  Office: [phone]  Pager: [phone]

## 2022-08-22 NOTE — ED Notes (Signed)
Report Given To      Descriptive Sentence / Reason for Admission   Left flank pain since yesterday, had outpatient CT that showed mild hydronephrosis.      Active Issues / Relevant Events   A/O x4/ ambulatory / lives at home  Full code  NPO        To Do List  Monitor VS  I/O  [place]  Advance diet       Anticipatory Guidance / Discharge Planning  OBS

## 2022-09-09 ENCOUNTER — Telehealth: Payer: Self-pay

## 2022-09-09 NOTE — Telephone Encounter (Signed)
Copied from CRM [id number]. Topic: Access to Care - Speak to Provider/Office Staff  >> Sep 09, 2022  3:24 PM Melvenia Beam wrote:  Joni Reining, from [facility] business office, is calling because she needs Dr Garfield Cornea to fill out [employer] form for this patient's upcoming procedure.     Joni Reining can be reached at [phone] if needed.

## 2022-09-11 NOTE — Telephone Encounter (Signed)
There is no paper work . This is a self pay pt her [employer] terms on 11.30.2023      Called pt she doesn't have any insurance at this time.   Please cancel this appt .

## 2022-09-11 NOTE — Telephone Encounter (Signed)
Called but unable to reach [place] from [place] business office, left message to fax or email [employer] form for this patient's upcoming procedure to Dr Garfield Cornea or [facility].

## 2022-09-17 NOTE — Telephone Encounter (Signed)
Patient is on Waitlist

## 2022-09-23 ENCOUNTER — Ambulatory Visit
Admission: RE | Admit: 2022-09-23 | Payer: Medicaid (Managed Care) | Source: Ambulatory Visit | Admitting: Student in an Organized Health Care Education/Training Program

## 2022-09-23 ENCOUNTER — Encounter: Admission: RE | Payer: Self-pay | Source: Ambulatory Visit

## 2022-09-23 SURGERY — COLONOSCOPY, FLEXIBLE
Anesthesia: Monitor Anesthesia Care

## 2022-12-02 ENCOUNTER — Ambulatory Visit: Payer: BC Managed Care – PPO | Admitting: Family

## 2022-12-02 ENCOUNTER — Encounter: Payer: Self-pay | Admitting: Family

## 2022-12-02 VITALS — BP 130/78 | HR 69 | Ht 68.0 in | Wt 194.6 lb

## 2022-12-02 DIAGNOSIS — E538 Deficiency of other specified B group vitamins: Secondary | ICD-10-CM

## 2022-12-02 DIAGNOSIS — R7303 Prediabetes: Secondary | ICD-10-CM

## 2022-12-02 DIAGNOSIS — E039 Hypothyroidism, unspecified: Secondary | ICD-10-CM

## 2022-12-02 DIAGNOSIS — Z6829 Body mass index (BMI) 29.0-29.9, adult: Secondary | ICD-10-CM

## 2022-12-02 DIAGNOSIS — F411 Generalized anxiety disorder: Secondary | ICD-10-CM | POA: Diagnosis not present

## 2022-12-02 DIAGNOSIS — E559 Vitamin D deficiency, unspecified: Secondary | ICD-10-CM

## 2022-12-02 DIAGNOSIS — I10 Essential (primary) hypertension: Secondary | ICD-10-CM | POA: Diagnosis not present

## 2022-12-02 DIAGNOSIS — E782 Mixed hyperlipidemia: Secondary | ICD-10-CM

## 2022-12-02 DIAGNOSIS — G4719 Other hypersomnia: Secondary | ICD-10-CM

## 2022-12-02 DIAGNOSIS — F331 Major depressive disorder, recurrent, moderate: Secondary | ICD-10-CM

## 2022-12-02 DIAGNOSIS — R635 Abnormal weight gain: Secondary | ICD-10-CM

## 2022-12-02 MED ORDER — TRAZODONE HCL 50 MG PO TABS: 50.0000 mg | ORAL_TABLET | Freq: Every day | ORAL | 1 refills | Status: DC

## 2022-12-02 MED ORDER — DESVENLAFAXINE SUCCINATE ER 25 MG PO TB24: ORAL_TABLET | ORAL | 0 refills | Status: DC

## 2022-12-02 NOTE — Assessment & Plan Note (Signed)
Labs today - Will see if we are able to pinpoint any reason for her weight gain.   Will discuss results at follow up.

## 2022-12-02 NOTE — Progress Notes (Signed)
New Patient Office Visit  Subjective    Patient ID: Sherri Kelly, female    DOB: 1976-02-22  Age: 47 y.o. MRN: [id number]  CC:  Chief Complaint  Patient presents with   Establish Care    New Patient    HPI Sherri Kelly presents to establish care Previous Primary Care provider/office:   Has been having issues with her eyes. Was told last year that it could potentially be extremely dry eye. 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  Extreme anxiety/moderate depression - has been on meds before, but has not been taking them for quite a while since she thought that they were potentially the cause of her weight gain.  However, she has continued to gain since she stopped taking them, and her anxiety and depression have worsened significantly, so she asks if we can start her on something for this. Has previously been on Zoloft, Prozac, and Lexapro.   No Other concerns today.   Anxiety Presents for initial visit. Onset was more than 5 years ago. The problem has been waxing and waning. Symptoms include decreased concentration, depressed mood, excessive worry, insomnia, irritability, nervous/anxious behavior, panic and restlessness. Symptoms occur most days. The severity of symptoms is severe and causing significant distress. The symptoms are aggravated by social activities. The quality of sleep is poor. Nighttime awakenings: one to two.   There are no known risk factors. Her past medical history is significant for anxiety/panic attacks and depression. Past treatments include SSRIs. The treatment provided moderate relief. Compliance with prior treatments has been good. Prior compliance problems include medication issues (stopped taking due to weight gain, though she is unsure that this is the cause as she kept gaining after stopping.).  Depression      The patient presents with depression.  This is a chronic problem.  The  current episode started more than 1 year ago.   Associated symptoms include decreased concentration, insomnia and restlessness.  Past treatments include SSRIs - Selective serotonin reuptake inhibitors.  Compliance with treatment is good.  Past compliance problems include medication issues (Thought the medications were the cause of her weight gain.).  Past medical history includes anxiety and depression.   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 The current episode started more than 1 year ago. There was no injury mechanism. There is No known exposure to pink eye. She Does not wear contacts. She has tried eye drops for the symptoms. The treatment provided mild relief.  Insomnia Primary symptoms: malaise/fatigue, napping.   The current episode started more than one year. The onset quality is undetermined. The problem occurs nightly. The symptoms are aggravated by anxiety. The symptoms are relieved by medication. PMH includes: depression.  Prior diagnostic workup includes:  No prior workup.    Outpatient Encounter Medications as of 12/02/2022  Medication Sig   desvenlafaxine (PRISTIQ) 25 MG 24 hr tablet Take 1 tablet (25 mg total) by mouth daily for 7 days, THEN 2 tablets (50 mg total) daily for 23 days.   [DISCONTINUED] traZODone (DESYREL) 50 MG tablet Take 50 mg by mouth at bedtime.   traZODone (DESYREL) 50 MG tablet Take 1 tablet (50 mg total) by mouth at bedtime.   No facility-administered encounter medications on file as of 12/02/2022.    Past Medical History:  Diagnosis Date   Anxiety 2020   Depression 2020    Past  Surgical History:  Procedure Laterality Date   NO PAST SURGERIES      Family History  Problem Relation Age of Onset   Cancer Mother 50       Lung   Hypertension Father    Alcohol abuse Father    Depression Father    Obesity Father    Thyroid disease Sister    ADD / ADHD Sister    Rheum arthritis Brother    Gallbladder disease Brother     Diabetes Maternal Grandmother    ADD / ADHD Daughter    Diabetes Paternal Grandmother    Obesity Paternal Grandmother    ADD / ADHD Daughter    ADD / ADHD Sister    Anxiety disorder Paternal Aunt    Arthritis Brother     Social History   Socioeconomic History   Marital status: Single    Spouse name: Not on file   Number of children: Not on file   Years of education: Not on file   Highest education level: Not on file  Occupational History   Not on file  Tobacco Use   Smoking status: Never   Smokeless tobacco: Never  Substance and Sexual Activity   Alcohol use: Yes    Comment: Occasionally   Drug use: Never   Sexual activity: Not Currently    Birth control/protection: Abstinence  Other Topics Concern   Not on file  Social History Narrative   Not on file   Social Determinants of Health   Financial Resource Strain: Not on file  Food Insecurity: Not on file  Transportation Needs: Not on file  Physical Activity: Not on file  Stress: Not on file  Social Connections: Not on file  Intimate Partner Violence: Not on file    Review of Systems  Constitutional:  Positive for irritability and malaise/fatigue.  Psychiatric/Behavioral:  Positive for decreased concentration and depression. The patient is nervous/anxious and has insomnia.         Objective    BP 130/78   Pulse 69   Ht 5' 8"$  (1.727 m)   Wt 194 lb 9.6 oz (88.3 kg)   SpO2 99%   BMI 29.59 kg/m   Physical Exam     Assessment & Plan:   Problem List Items Addressed This Visit     Generalized anxiety disorder - Primary   Relevant Medications   desvenlafaxine (PRISTIQ) 25 MG 24 hr tablet   traZODone (DESYREL) 50 MG tablet   Other Relevant Orders   CBC With Differential   CMP14+EGFR   TSH   Abnormal weight gain   Relevant Orders   TSH   Moderate recurrent major depression (HCC)   Relevant Medications   desvenlafaxine (PRISTIQ) 25 MG 24 hr tablet   traZODone (DESYREL) 50 MG tablet   Other  Visit Diagnoses     Hypothyroidism (acquired)       Relevant Orders   CBC With Differential   CMP14+EGFR   TSH   Essential hypertension, benign       Relevant Orders   CBC With Differential   CMP14+EGFR   Mixed hyperlipidemia       Relevant Orders   Lipid panel   Vitamin D deficiency, unspecified       Relevant Orders   VITAMIN D 25 Hydroxy (Vit-D Deficiency, Fractures)   B12 deficiency due to diet       Relevant Orders   Vitamin B12   Prediabetes       Relevant Orders  Hemoglobin A1c   Excessive daytime sleepiness       Relevant Orders   Home sleep test   Body mass index 29.0-29.9, adult           Return in about 2 weeks (around 12/16/2022).   Total time spent: 30 minutes  [place], FNP

## 2022-12-02 NOTE — Assessment & Plan Note (Signed)
Start Desvenlafaxine Refilling Trazodone  Will recheck at follow up.

## 2022-12-02 NOTE — Progress Notes (Signed)
Sleep Questionnaire: 4/10

## 2022-12-03 LAB — HEMOGLOBIN A1C
Est. average glucose Bld gHb Est-mCnc: 103 mg/dL
Hgb A1c MFr Bld: 5.2 % (ref 4.8–5.6)

## 2022-12-03 LAB — CBC WITH DIFFERENTIAL
Basophils Absolute: 0 10*3/uL (ref 0.0–0.2)
Basos: 1 %
EOS (ABSOLUTE): 0.1 10*3/uL (ref 0.0–0.4)
Eos: 2 %
Hematocrit: 37.3 % (ref 34.0–46.6)
Hemoglobin: 12.9 g/dL (ref 11.1–15.9)
Immature Grans (Abs): 0 10*3/uL (ref 0.0–0.1)
Immature Granulocytes: 0 %
Lymphocytes Absolute: 1.6 10*3/uL (ref 0.7–3.1)
Lymphs: 36 %
MCH: 30.2 pg (ref 26.6–33.0)
MCHC: 34.6 g/dL (ref 31.5–35.7)
MCV: 87 fL (ref 79–97)
Monocytes Absolute: 0.3 10*3/uL (ref 0.1–0.9)
Monocytes: 7 %
Neutrophils Absolute: 2.4 10*3/uL (ref 1.4–7.0)
Neutrophils: 54 %
RBC: 4.27 x10E6/uL (ref 3.77–5.28)
RDW: 12 % (ref 11.7–15.4)
WBC: 4.5 10*3/uL (ref 3.4–10.8)

## 2022-12-03 LAB — LIPID PANEL
Chol/HDL Ratio: 2.9 ratio (ref 0.0–4.4)
Cholesterol, Total: 183 mg/dL (ref 100–199)
HDL: 64 mg/dL (ref >39)
LDL Chol Calc (NIH): 99 mg/dL (ref 0–99)
Triglycerides: 111 mg/dL (ref 0–149)
VLDL Cholesterol Cal: 20 mg/dL (ref 5–40)

## 2022-12-03 LAB — CMP14+EGFR
ALT: 20 IU/L (ref 0–32)
AST: 21 IU/L (ref 0–40)
Albumin/Globulin Ratio: 1.5 (ref 1.2–2.2)
Albumin: 4.4 g/dL (ref 3.9–4.9)
Alkaline Phosphatase: 102 IU/L (ref 44–121)
BUN/Creatinine Ratio: 9 (ref 9–23)
BUN: 8 mg/dL (ref 6–24)
Bilirubin Total: 0.3 mg/dL (ref 0.0–1.2)
CO2: 25 mmol/L (ref 20–29)
Calcium: 9.9 mg/dL (ref 8.7–10.2)
Chloride: 104 mmol/L (ref 96–106)
Creatinine, Ser: 0.89 mg/dL (ref 0.57–1.00)
Globulin, Total: 3 g/dL (ref 1.5–4.5)
Glucose: 90 mg/dL (ref 70–99)
Potassium: 4.5 mmol/L (ref 3.5–5.2)
Sodium: 141 mmol/L (ref 134–144)
Total Protein: 7.4 g/dL (ref 6.0–8.5)
eGFR: 80 mL/min/{1.73_m2} (ref >59)

## 2022-12-03 LAB — VITAMIN D 25 HYDROXY (VIT D DEFICIENCY, FRACTURES): Vit D, 25-Hydroxy: 12.4 ng/mL — ABNORMAL LOW (ref 30.0–100.0)

## 2022-12-03 LAB — TSH: TSH: 1.77 u[IU]/mL (ref 0.450–4.500)

## 2022-12-09 ENCOUNTER — Ambulatory Visit: Payer: BC Managed Care – PPO | Admitting: Family

## 2022-12-16 ENCOUNTER — Ambulatory Visit: Payer: BC Managed Care – PPO | Admitting: Family

## 2022-12-16 ENCOUNTER — Other Ambulatory Visit: Payer: Self-pay

## 2022-12-16 DIAGNOSIS — E559 Vitamin D deficiency, unspecified: Secondary | ICD-10-CM

## 2022-12-17 ENCOUNTER — Ambulatory Visit: Payer: Medicaid (Managed Care) | Admitting: Gastroenterology

## 2022-12-19 MED ORDER — ERGOCALCIFEROL 1.25 MG (50000 UT) PO CAPS: 50000.0000 [IU] | ORAL_CAPSULE | ORAL | 3 refills | Status: DC

## 2022-12-26 ENCOUNTER — Other Ambulatory Visit: Payer: Self-pay | Admitting: Family

## 2022-12-26 DIAGNOSIS — F411 Generalized anxiety disorder: Secondary | ICD-10-CM

## 2022-12-26 DIAGNOSIS — F331 Major depressive disorder, recurrent, moderate: Secondary | ICD-10-CM

## 2022-12-26 MED ORDER — DESVENLAFAXINE SUCCINATE ER 50 MG PO TB24: 50.0000 mg | ORAL_TABLET | Freq: Every day | ORAL | 1 refills | Status: DC

## 2023-01-14 ENCOUNTER — Encounter: Payer: Self-pay | Admitting: Family

## 2023-01-21 MED ORDER — ZALEPLON 5 MG PO CAPS: 5.0000 mg | ORAL_CAPSULE | Freq: Every evening | ORAL | 0 refills | Status: DC | PRN

## 2023-02-19 ENCOUNTER — Encounter: Payer: Self-pay | Admitting: Family

## 2023-02-19 DIAGNOSIS — G4733 Obstructive sleep apnea (adult) (pediatric): Secondary | ICD-10-CM

## 2023-03-18 DIAGNOSIS — G4730 Sleep apnea, unspecified: Secondary | ICD-10-CM | POA: Diagnosis not present

## 2023-03-21 ENCOUNTER — Encounter: Payer: Self-pay | Admitting: Family

## 2023-03-22 DIAGNOSIS — G4730 Sleep apnea, unspecified: Secondary | ICD-10-CM

## 2023-03-22 HISTORY — DX: Sleep apnea, unspecified: G47.30

## 2023-04-14 ENCOUNTER — Encounter: Payer: Self-pay | Admitting: Family

## 2023-04-16 NOTE — Telephone Encounter (Signed)
Spoke with Morrie Sheldon at [facility] and he said they have the patient set up for  supplies tomorrow

## 2023-04-30 ENCOUNTER — Other Ambulatory Visit: Payer: Self-pay

## 2023-05-30 ENCOUNTER — Other Ambulatory Visit: Payer: Self-pay | Admitting: Family

## 2023-05-30 DIAGNOSIS — F411 Generalized anxiety disorder: Secondary | ICD-10-CM

## 2023-05-30 DIAGNOSIS — F331 Major depressive disorder, recurrent, moderate: Secondary | ICD-10-CM

## 2023-06-12 ENCOUNTER — Other Ambulatory Visit: Payer: Self-pay

## 2023-07-02 ENCOUNTER — Encounter: Payer: Self-pay | Admitting: Family Medicine

## 2023-07-02 ENCOUNTER — Ambulatory Visit: Payer: BC Managed Care – PPO | Admitting: Family Medicine

## 2023-07-02 ENCOUNTER — Encounter: Payer: Self-pay | Admitting: Cardiology

## 2023-07-02 VITALS — BP 112/78 | HR 76 | Ht 67.0 in | Wt 191.8 lb

## 2023-07-02 DIAGNOSIS — Z114 Encounter for screening for human immunodeficiency virus [HIV]: Secondary | ICD-10-CM

## 2023-07-02 DIAGNOSIS — R1013 Epigastric pain: Secondary | ICD-10-CM | POA: Diagnosis not present

## 2023-07-02 DIAGNOSIS — Z13220 Encounter for screening for lipoid disorders: Secondary | ICD-10-CM

## 2023-07-02 DIAGNOSIS — Z1159 Encounter for screening for other viral diseases: Secondary | ICD-10-CM

## 2023-07-02 DIAGNOSIS — F331 Major depressive disorder, recurrent, moderate: Secondary | ICD-10-CM

## 2023-07-02 DIAGNOSIS — F411 Generalized anxiety disorder: Secondary | ICD-10-CM

## 2023-07-02 DIAGNOSIS — Z23 Encounter for immunization: Secondary | ICD-10-CM

## 2023-07-02 DIAGNOSIS — G4733 Obstructive sleep apnea (adult) (pediatric): Secondary | ICD-10-CM | POA: Insufficient documentation

## 2023-07-02 DIAGNOSIS — R9431 Abnormal electrocardiogram [ECG] [EKG]: Secondary | ICD-10-CM

## 2023-07-02 DIAGNOSIS — N912 Amenorrhea, unspecified: Secondary | ICD-10-CM | POA: Diagnosis not present

## 2023-07-02 MED ORDER — ESCITALOPRAM OXALATE 10 MG PO TABS: 10.0000 mg | ORAL_TABLET | Freq: Every day | ORAL | 1 refills | Status: DC

## 2023-07-02 NOTE — Assessment & Plan Note (Signed)
Newly diagnosed in the past couple of months. On autotitrating CPAP. Fatigue not improving- possibly multifactorial. Recheck in about 3-4 weeks.

## 2023-07-02 NOTE — Progress Notes (Signed)
BP 112/78   Pulse 76   Ht 5\' 7"  (1.702 m)   Wt 191 lb 12.8 oz (87 kg)   LMP  (Within Years)   SpO2 98%   BMI 30.04 kg/m    Subjective:    Patient ID: Sherri Kelly, female    DOB: 06-13-1976, 47 y.o.   MRN: [id number]  HPI: Sherri Kelly is a 47 y.o. female who presents today to establish care.   Chief Complaint  Patient presents with   Depression    Patient says she would like to discuss her current prescription (Pristiq) for her Depression. Patient says she does not feel like the medications is helping any longer and would like to discuss different treatment options.    Sleep Apnea   Abdominal Pain    Patient says she would like to discuss a pain that she has noticed underneath sternum area. Patient says she has had the pain, but within the past few days she has noticed the pain getting worse. Patient says it feels like an "expanding gas pain."   Flu Vaccine    Patient was offered a Flu vaccine at today's visit and declined.    ANXIETY/DEPRESSION- started having some problems with her mood in about 2020, she doesn't have any reason why she started feeling off. She notes that that was around the time that she had her IUD removed and she notes that she started feeling a lot worse- she has tried zoloft (about a year), prozac (about a year), lexapro (about a year), pristiq (about 6 months) without much benefit. She notes that they take the edge off, has never seen a psychiatrist, she notes that her biggest problem seems to be motivation, has never done any counseling  Duration: about 4 years Anxious mood: yes  Excessive worrying: yes Irritability: yes  Sweating: no Nausea: no Palpitations:no Hyperventilation: no Panic attacks: yes Agoraphobia: yes  Obscessions/compulsions: yes Depressed mood: yes    12/02/2022    2:48 PM  Depression screen PHQ 2/9  Decreased Interest 3  Down, Depressed, Hopeless 2  PHQ - 2 Score 5  Altered sleeping 2  Tired, decreased energy 3  Change  in appetite 2  Feeling bad or failure about yourself  3  Trouble concentrating 2  Moving slowly or fidgety/restless 2  Suicidal thoughts 0  PHQ-9 Score 19      12/02/2022    2:49 PM  GAD 7 : Generalized Anxiety Score  Nervous, Anxious, on Edge 3  Control/stop worrying 3  Worry too much - different things 3  Trouble relaxing 3  Restless 3  Easily annoyed or irritable 3  Afraid - awful might happen 3  Total GAD 7 Score 21  Anxiety Difficulty Very difficult   Anhedonia: yes Weight changes: yes Insomnia: yes hard to stay asleep  Hypersomnia: yes Fatigue/loss of energy: yes Feelings of worthlessness: yes Feelings of guilt: yes Impaired concentration/indecisiveness: yes Suicidal ideations: no  Crying spells: yes Recent Stressors/Life Changes: no   Relationship problems: no   Family stress: no     Financial stress: no    Job stress: no    Recent death/loss: no  SLEEP APNEA- bought her own auto-titrating CPAP machine, did Sleep apnea status: uncontrolled Duration: about 3 months Satisfied with current treatment?:  no CPAP use:  yes Sleep quality with CPAP use: fair Treament compliance:good compliance Last sleep study: 3 months Treatments attempted: CPAP Wakes feeling refreshed:  no Daytime hypersomnolence:  yes Fatigue:  yes Insomnia:  yes Good sleep hygiene:  yes Difficulty falling asleep:  no Difficulty staying asleep:  yes Snoring bothers bed partner:  no Observed apnea by bed partner: no Obesity:  yes Hypertension: no  Pulmonary hypertension:  no Coronary artery disease:  no  ABDOMINAL PAIN  Duration: a few years, significantly worse in the past couple of weeks  Onset: sudden Severity: severe Quality: pressure Location:  epigastric  Episode duration: 30 minutes Radiation: no Frequency: intermittent Alleviating factors: heating pad Aggravating factors: nothing Status: worse Treatments attempted: anti-gas, indigestion medicine Fever: no Nausea:  no Vomiting: no Weight loss: no Decreased appetite: no Diarrhea: no Constipation: no Blood in stool: no Heartburn: no Jaundice: no Rash: no Dysuria/urinary frequency: no Hematuria: no History of sexually transmitted disease: no Recurrent NSAID use: yes- 800mg  at least 1x a day for aches and pains   Active Ambulatory Problems    Diagnosis Date Noted   Generalized anxiety disorder 12/02/2022   Moderate recurrent major depression (HCC) 12/02/2022   OSA (obstructive sleep apnea) 07/02/2023   Resolved Ambulatory Problems    Diagnosis Date Noted   Abnormal weight gain 12/02/2022   Past Medical History:  Diagnosis Date   Anxiety 2020   Depression 2020   Plantar fasciitis    Sleep apnea 03/2023   Past Surgical History:  Procedure Laterality Date   NO PAST SURGERIES     Outpatient Encounter Medications as of 07/02/2023  Medication Sig   ergocalciferol (VITAMIN D2) 1.25 MG (50000 UT) capsule Take 1 capsule (50,000 Units total) by mouth once a week.   escitalopram (LEXAPRO) 10 MG tablet Take 1 tablet (10 mg total) by mouth daily.   traZODone (DESYREL) 50 MG tablet TAKE 1 TABLET BY MOUTH AT BEDTIME   [DISCONTINUED] desvenlafaxine (PRISTIQ) 50 MG 24 hr tablet Take 1 tablet (50 mg total) by mouth daily.   [DISCONTINUED] zaleplon (SONATA) 5 MG capsule Take 1 capsule (5 mg total) by mouth at bedtime as needed for sleep. (Patient not taking: Reported on 07/02/2023)   No facility-administered encounter medications on file as of 07/02/2023.   No Known Allergies  Social History   Socioeconomic History   Marital status: Single    Spouse name: Not on file   Number of children: Not on file   Years of education: Not on file   Highest education level: GED or equivalent  Occupational History   Not on file  Tobacco Use   Smoking status: Never   Smokeless tobacco: Never  Substance and Sexual Activity   Alcohol use: Yes    Comment: Occasionally   Drug use: Never   Sexual activity:  Not Currently    Birth control/protection: Abstinence  Other Topics Concern   Not on file  Social History Narrative   Not on file   Social Determinants of Health   Financial Resource Strain: Medium Risk (06/25/2023)   Overall Financial Resource Strain (CARDIA)    Difficulty of Paying Living Expenses: Somewhat hard  Food Insecurity: Food Insecurity Present (06/25/2023)   Hunger Vital Sign    Worried About Running Out of Food in the Last Year: Sometimes true    Ran Out of Food in the Last Year: Never true  Transportation Needs: No Transportation Needs (06/25/2023)   PRAPARE - Administrator, Civil Service (Medical): No    Lack of Transportation (Non-Medical): No  Physical Activity: Sufficiently Active (06/25/2023)   Exercise Vital Sign    Days of Exercise per Week: 3 days    Minutes  of Exercise per Session: 60 min  Stress: Stress Concern Present (06/25/2023)   [employer] of Occupational Health - Occupational Stress Questionnaire    Feeling of Stress : Very much  Social Connections: Moderately Isolated (06/25/2023)   Social Connection and Isolation Panel [NHANES]    Frequency of Communication with Friends and Family: Twice a week    Frequency of Social Gatherings with Friends and Family: Twice a week    Attends Religious Services: 1 to 4 times per year    Active Member of [employer] or Organizations: No    Attends Engineer, structural: Not on file    Marital Status: Never married   Family History  Problem Relation Age of Onset   Cancer Mother 2       Lung   Hypertension Father    Alcohol abuse Father    Depression Father    Obesity Father    Thyroid disease Sister    ADD / ADHD Sister    Rheum arthritis Brother    Gallbladder disease Brother    Diabetes Maternal Grandmother    ADD / ADHD Daughter    Diabetes Paternal Grandmother    Obesity Paternal Grandmother    ADD / ADHD Daughter    ADD / ADHD Sister    Anxiety disorder Paternal Aunt    Arthritis  Brother     Review of Systems  Constitutional:  Positive for diaphoresis and fatigue. Negative for activity change, appetite change, chills, fever and unexpected weight change.  HENT: Negative.    Respiratory: Negative.    Cardiovascular: Negative.   Gastrointestinal:  Positive for abdominal pain. Negative for abdominal distention, anal bleeding, blood in stool, constipation, diarrhea, nausea, rectal pain and vomiting.  Musculoskeletal: Negative.   Skin: Negative.   Psychiatric/Behavioral:  Positive for dysphoric mood. Negative for agitation, behavioral problems, confusion, decreased concentration, hallucinations, self-injury, sleep disturbance and suicidal ideas. The patient is nervous/anxious. The patient is not hyperactive.     Per HPI unless specifically indicated above     Objective:    BP 112/78   Pulse 76   Ht 5\' 7"  (1.702 m)   Wt 191 lb 12.8 oz (87 kg)   LMP  (Within Years)   SpO2 98%   BMI 30.04 kg/m   Wt Readings from Last 3 Encounters:  07/02/23 191 lb 12.8 oz (87 kg)  12/02/22 194 lb 9.6 oz (88.3 kg)    Physical Exam Vitals and nursing note reviewed.  Constitutional:      General: She is not in acute distress.    Appearance: Normal appearance. She is well-developed. She is obese. She is not ill-appearing, toxic-appearing or diaphoretic.  HENT:     Head: Normocephalic and atraumatic.     Right Ear: External ear normal.     Left Ear: External ear normal.     Nose: Nose normal.     Mouth/Throat:     Mouth: Mucous membranes are moist.     Pharynx: Oropharynx is clear.  Eyes:     General: No scleral icterus.       Right eye: No discharge.        Left eye: No discharge.     Extraocular Movements: Extraocular movements intact.     Conjunctiva/sclera: Conjunctivae normal.     Pupils: Pupils are equal, round, and reactive to light.  Cardiovascular:     Rate and Rhythm: Normal rate and regular rhythm.     Pulses: Normal pulses.     Heart  sounds: Normal heart  sounds. No murmur heard.    No friction rub. No gallop.  Pulmonary:     Effort: Pulmonary effort is normal. No respiratory distress.     Breath sounds: Normal breath sounds. No stridor. No wheezing, rhonchi or rales.  Chest:     Chest wall: No tenderness.  Abdominal:     General: Abdomen is flat. Bowel sounds are normal. There is no distension.     Palpations: Abdomen is soft. There is no mass.     Tenderness: There is no abdominal tenderness. There is no right CVA tenderness, left CVA tenderness, guarding or rebound.     Hernia: No hernia is present.  Musculoskeletal:        General: Normal range of motion.     Cervical back: Normal range of motion and neck supple.  Skin:    General: Skin is warm and dry.     Capillary Refill: Capillary refill takes less than 2 seconds.     Coloration: Skin is not jaundiced or pale.     Findings: No bruising, erythema, lesion or rash.  Neurological:     General: No focal deficit present.     Mental Status: She is alert and oriented to person, place, and time. Mental status is at baseline.  Psychiatric:        Mood and Affect: Mood normal.        Behavior: Behavior normal.        Thought Content: Thought content normal.        Judgment: Judgment normal.     Results for orders placed or performed in visit on 12/02/22  Lipid panel  Result Value Ref Range   Cholesterol, Total 183 100 - 199 mg/dL   Triglycerides 829 0 - 149 mg/dL   HDL 64 >56 mg/dL   VLDL Cholesterol Cal 20 5 - 40 mg/dL   LDL Chol Calc (NIH) 99 0 - 99 mg/dL   Chol/HDL Ratio 2.9 0.0 - 4.4 ratio  VITAMIN D 25 Hydroxy (Vit-D Deficiency, Fractures)  Result Value Ref Range   Vit D, 25-Hydroxy 12.4 (L) 30.0 - 100.0 ng/mL  CBC With Differential  Result Value Ref Range   WBC 4.5 3.4 - 10.8 x10E3/uL   RBC 4.27 3.77 - 5.28 x10E6/uL   Hemoglobin 12.9 11.1 - 15.9 g/dL   Hematocrit 21.3 08.6 - 46.6 %   MCV 87 79 - 97 fL   MCH 30.2 26.6 - 33.0 pg   MCHC 34.6 31.5 - 35.7 g/dL   RDW  57.8 46.9 - 62.9 %   Neutrophils 54 Not Estab. %   Lymphs 36 Not Estab. %   Monocytes 7 Not Estab. %   Eos 2 Not Estab. %   Basos 1 Not Estab. %   Neutrophils Absolute 2.4 1.4 - 7.0 x10E3/uL   Lymphocytes Absolute 1.6 0.7 - 3.1 x10E3/uL   Monocytes Absolute 0.3 0.1 - 0.9 x10E3/uL   EOS (ABSOLUTE) 0.1 0.0 - 0.4 x10E3/uL   Basophils Absolute 0.0 0.0 - 0.2 x10E3/uL   Immature Granulocytes 0 Not Estab. %   Immature Grans (Abs) 0.0 0.0 - 0.1 x10E3/uL  CMP14+EGFR  Result Value Ref Range   Glucose 90 70 - 99 mg/dL   BUN 8 6 - 24 mg/dL   Creatinine, Ser 5.28 0.57 - 1.00 mg/dL   eGFR 80 >41 LK/GMW/1.02   BUN/Creatinine Ratio 9 9 - 23   Sodium 141 134 - 144 mmol/L   Potassium 4.5 3.5 - 5.2  mmol/L   Chloride 104 96 - 106 mmol/L   CO2 25 20 - 29 mmol/L   Calcium 9.9 8.7 - 10.2 mg/dL   Total Protein 7.4 6.0 - 8.5 g/dL   Albumin 4.4 3.9 - 4.9 g/dL   Globulin, Total 3.0 1.5 - 4.5 g/dL   Albumin/Globulin Ratio 1.5 1.2 - 2.2   Bilirubin Total 0.3 0.0 - 1.2 mg/dL   Alkaline Phosphatase 102 44 - 121 IU/L   AST 21 0 - 40 IU/L   ALT 20 0 - 32 IU/L  TSH  Result Value Ref Range   TSH 1.770 0.450 - 4.500 uIU/mL  Hemoglobin A1c  Result Value Ref Range   Hgb A1c MFr Bld 5.2 4.8 - 5.6 %   Est. average glucose Bld gHb Est-mCnc 103 mg/dL      Assessment & Plan:   Problem List Items Addressed This Visit       Respiratory   OSA (obstructive sleep apnea)    Newly diagnosed in the past couple of months. On autotitrating CPAP. Fatigue not improving- possibly multifactorial. Recheck in about 3-4 weeks.         Other   Generalized anxiety disorder    Not under good control. Was doing better on lexapro than pristiq. We will change back to lexapro and recheck in about 3-4 weeks. Call with any concerns.       Relevant Medications   escitalopram (LEXAPRO) 10 MG tablet   Moderate recurrent major depression (HCC) - Primary    Not under good control. Was doing better on lexapro than pristiq. We  will change back to lexapro and recheck in about 3-4 weeks. Call with any concerns.       Relevant Medications   escitalopram (LEXAPRO) 10 MG tablet   Other Relevant Orders   CBC with Differential/Platelet   Comprehensive metabolic panel   Other Visit Diagnoses     Epigastric pain       Worsening over the past couple of weeks. Flipped t-waves on EKG- will refer to cardiology. Await their input.   Relevant Orders   EKG 12-Lead (Completed)   Ambulatory referral to Cardiology   Amenorrhea       Likely post-menopausal. Will check labs. Await results.   Relevant Orders   LH   FSH   Estradiol   TSH   Need for hepatitis C screening test       Will check labs. Await results.   Relevant Orders   Hepatitis C Antibody   Screening for HIV (human immunodeficiency virus)       Will check labs. Await results.   Relevant Orders   HIV Antibody (routine testing w rflx)   Screening for cholesterol level       Will check labs. Await results.   Relevant Orders   Lipid Panel w/o Chol/HDL Ratio   Abnormal EKG       Epigastric pain, worsening over the past couple of weeks. Flipped t-waves on EKG- will refer to cardiology for further evaluation.   Relevant Orders   Ambulatory referral to Cardiology        Follow up plan: Return in about 4 weeks (around 07/30/2023).

## 2023-07-02 NOTE — Assessment & Plan Note (Signed)
Not under good control. Was doing better on lexapro than pristiq. We will change back to lexapro and recheck in about 3-4 weeks. Call with any concerns.

## 2023-07-03 ENCOUNTER — Encounter: Payer: Self-pay | Admitting: Family Medicine

## 2023-07-03 LAB — COMPREHENSIVE METABOLIC PANEL
ALT: 25 IU/L (ref 0–32)
AST: 28 IU/L (ref 0–40)
Albumin: 4.4 g/dL (ref 3.9–4.9)
Alkaline Phosphatase: 103 IU/L (ref 44–121)
BUN/Creatinine Ratio: 14 (ref 9–23)
BUN: 12 mg/dL (ref 6–24)
Bilirubin Total: 0.4 mg/dL (ref 0.0–1.2)
CO2: 23 mmol/L (ref 20–29)
Calcium: 10 mg/dL (ref 8.7–10.2)
Chloride: 100 mmol/L (ref 96–106)
Creatinine, Ser: 0.87 mg/dL (ref 0.57–1.00)
Globulin, Total: 2.9 g/dL (ref 1.5–4.5)
Glucose: 88 mg/dL (ref 70–99)
Potassium: 4.3 mmol/L (ref 3.5–5.2)
Sodium: 138 mmol/L (ref 134–144)
Total Protein: 7.3 g/dL (ref 6.0–8.5)
eGFR: 82 mL/min/{1.73_m2} (ref >59)

## 2023-07-03 LAB — CBC WITH DIFFERENTIAL/PLATELET
Basophils Absolute: 0.1 10*3/uL (ref 0.0–0.2)
Basos: 1 %
EOS (ABSOLUTE): 0.1 10*3/uL (ref 0.0–0.4)
Eos: 2 %
Hematocrit: 39 % (ref 34.0–46.6)
Hemoglobin: 12.8 g/dL (ref 11.1–15.9)
Immature Grans (Abs): 0 10*3/uL (ref 0.0–0.1)
Immature Granulocytes: 0 %
Lymphocytes Absolute: 1.5 10*3/uL (ref 0.7–3.1)
Lymphs: 36 %
MCH: 29.8 pg (ref 26.6–33.0)
MCHC: 32.8 g/dL (ref 31.5–35.7)
MCV: 91 fL (ref 79–97)
Monocytes Absolute: 0.3 10*3/uL (ref 0.1–0.9)
Monocytes: 7 %
Neutrophils Absolute: 2.1 10*3/uL (ref 1.4–7.0)
Neutrophils: 54 %
Platelets: 320 10*3/uL (ref 150–450)
RBC: 4.3 x10E6/uL (ref 3.77–5.28)
RDW: 12.1 % (ref 11.7–15.4)
WBC: 4 10*3/uL (ref 3.4–10.8)

## 2023-07-03 LAB — LUTEINIZING HORMONE: LH: 69.2 m[IU]/mL

## 2023-07-03 LAB — HEPATITIS C ANTIBODY: Hep C Virus Ab: NONREACTIVE

## 2023-07-03 LAB — LIPID PANEL W/O CHOL/HDL RATIO
Cholesterol, Total: 223 mg/dL — ABNORMAL HIGH (ref 100–199)
HDL: 61 mg/dL (ref >39)
LDL Chol Calc (NIH): 145 mg/dL — ABNORMAL HIGH (ref 0–99)
Triglycerides: 96 mg/dL (ref 0–149)
VLDL Cholesterol Cal: 17 mg/dL (ref 5–40)

## 2023-07-03 LAB — HIV ANTIBODY (ROUTINE TESTING W REFLEX): HIV Screen 4th Generation wRfx: NONREACTIVE

## 2023-07-03 LAB — FOLLICLE STIMULATING HORMONE: FSH: 153 m[IU]/mL

## 2023-07-03 LAB — ESTRADIOL: Estradiol: <5 pg/mL

## 2023-07-03 LAB — TSH: TSH: 2.14 u[IU]/mL (ref 0.450–4.500)

## 2023-07-07 ENCOUNTER — Encounter: Payer: Self-pay | Admitting: Family Medicine

## 2023-07-13 NOTE — Progress Notes (Signed)
Flipped t-waves V1-V5, NSR at 67bpm

## 2023-07-15 ENCOUNTER — Ambulatory Visit: Payer: BC Managed Care – PPO | Attending: Cardiology | Admitting: Cardiology

## 2023-07-15 ENCOUNTER — Encounter: Payer: Self-pay | Admitting: Cardiology

## 2023-07-15 VITALS — BP 124/76 | HR 79 | Ht 68.0 in | Wt 193.8 lb

## 2023-07-15 DIAGNOSIS — R072 Precordial pain: Secondary | ICD-10-CM | POA: Diagnosis not present

## 2023-07-15 DIAGNOSIS — R0602 Shortness of breath: Secondary | ICD-10-CM | POA: Diagnosis not present

## 2023-07-15 DIAGNOSIS — R9431 Abnormal electrocardiogram [ECG] [EKG]: Secondary | ICD-10-CM | POA: Diagnosis not present

## 2023-07-15 MED ORDER — METOPROLOL TARTRATE 100 MG PO TABS: 100.0000 mg | ORAL_TABLET | Freq: Once | ORAL | 0 refills | Status: DC

## 2023-07-15 NOTE — Progress Notes (Signed)
Cardiology Office Note:  .   Date:  07/15/2023  ID:  Alroy Dust, DOB [birth year], MRN [id number] PCP: Dorcas Carrow, DO  [facility] Providers Cardiologist:  None    History of Present Illness: .   Sherri Kelly is a 47 y.o. female here for evaluation of abnormal ECG, CP. Discussed with the use of AI scribe software   History of Present Illness   The patient, known to have a history of sleep apnea, presents with complaints of central chest or epigastric pain, which she initially attributed to stomach-related issues. The discomfort is localized and does not radiate. On one occasion, the pain was prolonged, leading the patient to consider seeking emergency care. The patient also reports increased shortness of breath with exertion, feeling as though she has aged significantly in a short period.  The patient denies being a smoker and reports a family history of heart murmurs and heart disease. She had an EKG done about four years ago, but no further cardiac investigations were pursued at that time. Recently, the patient's Apple Watch has been indicating a heart rate in the 40s, particularly during sleep. She has also experienced occasional dizziness and palpitations, which she describes as a sensation of her heartbeat in her throat, leading to a cough.  The patient's recent EKG showed T wave inversions, which could potentially indicate problems with blood flow through the heart. However, the patient has not had any fainting episodes or significant limitations in her physical activity.      - Mother had a heart murmur - Sister has a heart murmur - Brother possibly has a heart murmur - Paternal grandfather died of a massive heart attack (heavy drinker) - Paternal grandmother had heart surgery in her 5s  Studies Reviewed: Marland Kitchen        LABS Creatinine: 0.87 (07/02/2023) ALT: 25 LDL: 145 Total cholesterol: 223 Triglycerides: 96 Hemoglobin: 12.8 Hemoglobin A1c: 5.2 TSH:  2.1  DIAGNOSTIC EKG: Sinus rhythm 67 with T wave inversion in first three precordial leads. Possible ischemia. (07/02/2023)  Risk Assessment/Calculations:    LABS Creatinine: 0.87 (07/02/2023) ALT: 25 LDL: 145 Total cholesterol: 223 Triglycerides: 96 Hemoglobin: 12.8 Hemoglobin A1c: 5.2 TSH: 2.1  DIAGNOSTIC EKG: Sinus rhythm 67 with T wave inversion in first three precordial leads. Possible ischemia. (07/02/2023)         Physical Exam:   VS:  BP 124/76   Pulse 79   Ht 5\' 8"  (1.727 m)   Wt 193 lb 12.8 oz (87.9 kg)   SpO2 97%   BMI 29.47 kg/m    Wt Readings from Last 3 Encounters:  07/15/23 193 lb 12.8 oz (87.9 kg)  07/02/23 191 lb 12.8 oz (87 kg)  12/02/22 194 lb 9.6 oz (88.3 kg)    GEN: Well nourished, well developed in no acute distress NECK: No JVD; No carotid bruits CARDIAC: RRR, no murmurs, rubs, gallops RESPIRATORY:  Clear to auscultation without rales, wheezing or rhonchi  ABDOMEN: Soft, non-tender, non-distended EXTREMITIES:  No edema; No deformity   ASSESSMENT AND PLAN: .    Assessment and Plan    Chest/Epigastric Pain Central chest/epigastric pain, possibly related to exertion. No prior cardiac workup. Recent EKG shows T wave inversions in the first three precordial leads, suggesting possible ischemia. -Order coronary CT scan to assess for plaque buildup or blockages. -Order echocardiogram to assess cardiac function and structure.  Shortness of Breath Increased shortness of breath with exertion. No known lung disease. Non-smoker. -Consider further evaluation based  on results of cardiac workup.  Family History of Heart Disease Multiple family members with heart murmurs. Paternal grandfather died of a massive heart attack, but was a heavy drinker. Paternal grandmother had heart surgery in her sixties. -Consider in assessment of chest pain and shortness of breath.  Palpitations Occasional sensation of heartbeat in throat, causing a cough. Possible  premature ventricular contractions (PVCs) or premature atrial contractions (PACs). -Consider further evaluation based on results of cardiac workup. -Has Apple watch  Follow-up Pending results of coronary CT scan and echocardiogram.            Dispo: PRN  Signed, Donato Schultz, MD

## 2023-07-15 NOTE — Patient Instructions (Addendum)
Medication Instructions:  No changes *If you need a refill on your cardiac medications before your next appointment, please call your pharmacy*   Lab Work: none  Testing/Procedures: Your physician has requested that you have an echocardiogram. Echocardiography is a painless test that uses sound waves to create images of your heart. It provides your doctor with information about the size and shape of your heart and how well your heart's chambers and valves are working. This procedure takes approximately one hour. There are no restrictions for this procedure. Please do NOT wear cologne, perfume, aftershave, or lotions (deodorant is allowed). Please arrive 15 minutes prior to your appointment time.  Cardiac CT Angiogram - see instructions below   Follow-Up: AS NEEDED BASED ON RESULTS  Other Instructions   Your cardiac CT will be scheduled at one of the below locations:   [facility] [address] [place], [place] [address] [phone]  OR  [facility] [address] [place], [place] [address] [phone]  OR   [facility] [address] [place], [place] [address] [phone]  If scheduled at [facility], please arrive at the [facility] and Children's Entrance (Entrance C2) of [facility] 30 minutes prior to test start time. You can use the FREE valet parking offered at entrance C (encouraged to control the heart rate for the test)  Proceed to the [facility] Radiology Department (first floor) to check-in and test prep.  All radiology patients and guests should use entrance C2 at [facility], accessed from [facility], even though the hospital's physical address listed is [address].    If scheduled at [facility] or [facility], please arrive 15 mins early for check-in and test prep.  There  is spacious parking and easy access to the radiology department from the [facility] Vascular entrance. Please enter here and check-in with the desk attendant.   Please follow these instructions carefully (unless otherwise directed):  An IV will be required for this test and Nitroglycerin will be given.   On the Night Before the Test: Be sure to Drink plenty of water. Do not consume any caffeinated/decaffeinated beverages or chocolate 12 hours prior to your test. Do not take any antihistamines 12 hours prior to your test.  On the Day of the Test: Drink plenty of water until 1 hour prior to the test. Do not eat any food 1 hour prior to test. You may take your regular medications prior to the test.  Take metoprolol (Lopressor) two hours prior to test. FEMALES- please wear underwire-free bra if available, avoid dresses & tight clothing      After the Test: Drink plenty of water. After receiving IV contrast, you may experience a mild flushed feeling. This is normal. On occasion, you may experience a mild rash up to 24 hours after the test. This is not dangerous. If this occurs, you can take Benadryl 25 mg and increase your fluid intake. If you experience trouble breathing, this can be serious. If it is severe call 911 IMMEDIATELY. If it is mild, please call our office. If you take any of these medications: Glipizide/Metformin, Avandament, Glucavance, please do not take 48 hours after completing test unless otherwise instructed.  We will call to schedule your test 2-4 weeks out understanding that some insurance companies will need an authorization prior to the service being performed.   For more information  and frequently asked questions, please visit our website : [email or web address]  For non-scheduling related questions, please contact the cardiac imaging nurse navigator should you have any questions/concerns: Cardiac Imaging Nurse Navigators Direct Office Dial:  [phone]   For scheduling needs, including cancellations and rescheduling, please call [place], [phone].

## 2023-07-16 ENCOUNTER — Ambulatory Visit: Payer: BC Managed Care – PPO | Attending: Cardiology

## 2023-07-16 DIAGNOSIS — R9431 Abnormal electrocardiogram [ECG] [EKG]: Secondary | ICD-10-CM

## 2023-07-16 DIAGNOSIS — R072 Precordial pain: Secondary | ICD-10-CM | POA: Diagnosis not present

## 2023-07-16 DIAGNOSIS — R0602 Shortness of breath: Secondary | ICD-10-CM

## 2023-07-16 LAB — ECHOCARDIOGRAM COMPLETE
AR max vel: 3.01 cm2
AV Area VTI: 2.98 cm2
AV Area mean vel: 3.06 cm2
AV Mean grad: 3 mmHg
AV Peak grad: 5.9 mmHg
Ao pk vel: 1.21 m/s
Area-P 1/2: 3.71 cm2
Calc EF: 56.6 %
S' Lateral: 3.2 cm
Single Plane A2C EF: 57.5 %
Single Plane A4C EF: 55.6 %

## 2023-07-24 ENCOUNTER — Other Ambulatory Visit: Payer: BC Managed Care – PPO

## 2023-07-28 ENCOUNTER — Encounter: Payer: Self-pay | Admitting: Cardiology

## 2023-07-29 ENCOUNTER — Encounter (HOSPITAL_COMMUNITY): Payer: Self-pay

## 2023-07-31 ENCOUNTER — Ambulatory Visit
Admission: RE | Admit: 2023-07-31 | Discharge: 2023-07-31 | Disposition: A | Discharge status: Home / Self Care | Payer: BC Managed Care – PPO | Source: Ambulatory Visit | Attending: Cardiology | Admitting: Cardiology

## 2023-07-31 DIAGNOSIS — R9431 Abnormal electrocardiogram [ECG] [EKG]: Secondary | ICD-10-CM | POA: Diagnosis not present

## 2023-07-31 DIAGNOSIS — R072 Precordial pain: Secondary | ICD-10-CM | POA: Insufficient documentation

## 2023-07-31 DIAGNOSIS — R0602 Shortness of breath: Secondary | ICD-10-CM | POA: Diagnosis not present

## 2023-07-31 MED ORDER — NITROGLYCERIN 0.4 MG SL SUBL
0.8000 mg | SUBLINGUAL_TABLET | Freq: Once | SUBLINGUAL | Status: AC
  Administered 2023-07-31: 0.8 mg via SUBLINGUAL

## 2023-07-31 MED ORDER — IOHEXOL 350 MG/ML SOLN
75.0000 mL | Freq: Once | INTRAVENOUS | Status: AC | PRN
  Administered 2023-07-31: 75 mL via INTRAVENOUS

## 2023-07-31 MED ORDER — SODIUM CHLORIDE 0.9 % IV BOLUS
150.0000 mL | Freq: Once | INTRAVENOUS | Status: AC
  Administered 2023-07-31: 150 mL via INTRAVENOUS

## 2023-07-31 NOTE — Progress Notes (Signed)
Pt tolerated procedure well with no issues. Pt ABCs intact. Pt denies any complaints. Pt encouraged to drink plenty of water throughout the day. Pt ambulatory with steady gait.

## 2023-08-05 ENCOUNTER — Encounter: Payer: Self-pay | Admitting: Family Medicine

## 2023-08-05 ENCOUNTER — Ambulatory Visit: Payer: BC Managed Care – PPO | Admitting: Family Medicine

## 2023-08-05 VITALS — BP 111/73 | HR 73 | Ht 68.0 in | Wt 193.0 lb

## 2023-08-05 DIAGNOSIS — F331 Major depressive disorder, recurrent, moderate: Secondary | ICD-10-CM | POA: Diagnosis not present

## 2023-08-05 DIAGNOSIS — Z1211 Encounter for screening for malignant neoplasm of colon: Secondary | ICD-10-CM | POA: Diagnosis not present

## 2023-08-05 DIAGNOSIS — Z8249 Family history of ischemic heart disease and other diseases of the circulatory system: Secondary | ICD-10-CM | POA: Diagnosis not present

## 2023-08-05 DIAGNOSIS — G4733 Obstructive sleep apnea (adult) (pediatric): Secondary | ICD-10-CM

## 2023-08-05 DIAGNOSIS — R1013 Epigastric pain: Secondary | ICD-10-CM

## 2023-08-05 DIAGNOSIS — Z1231 Encounter for screening mammogram for malignant neoplasm of breast: Secondary | ICD-10-CM

## 2023-08-05 MED ORDER — ESCITALOPRAM OXALATE 20 MG PO TABS: 20.0000 mg | ORAL_TABLET | Freq: Every day | ORAL | 2 refills | Status: DC

## 2023-08-05 MED ORDER — PANTOPRAZOLE SODIUM 40 MG PO TBEC: 40.0000 mg | DELAYED_RELEASE_TABLET | Freq: Every day | ORAL | 3 refills | Status: DC

## 2023-08-05 MED ORDER — ROSUVASTATIN CALCIUM 5 MG PO TABS: 5.0000 mg | ORAL_TABLET | Freq: Every day | ORAL | 3 refills | Status: DC

## 2023-08-05 NOTE — Patient Instructions (Addendum)
Your mammogram is scheduled for 10/28/22 at 10:00 at  [facility] at [facility].   Address: [address], [place], [place] [address] Phone: [phone]

## 2023-08-05 NOTE — Progress Notes (Signed)
BP 111/73   Pulse 73   Ht 5\' 8"  (1.727 m)   Wt 193 lb (87.5 kg)   SpO2 97%   BMI 29.35 kg/m    Subjective:    Patient ID: Sherri Kelly, female    DOB: [birth year], 47 y.o.   MRN: [id number]  HPI: Sherri Kelly is a 47 y.o. female  Chief Complaint  Patient presents with   Depression   Anxiety    Patient says everything is still about the same but during this time, she has been handling the hard stuff.    Sleep Apnea   ANXIETY/DEPRESSION Duration: chronic Status:stable Anxious mood: yes  Excessive worrying: yes Irritability: no  Sweating: no Nausea: no Palpitations:no Hyperventilation: no Panic attacks: no Agoraphobia: no  Obscessions/compulsions: no Depressed mood: yes    08/05/2023   10:02 AM 12/02/2022    2:48 PM  Depression screen PHQ 2/9  Decreased Interest 3 3  Down, Depressed, Hopeless 3 2  PHQ - 2 Score 6 5  Altered sleeping 3 2  Tired, decreased energy 3 3  Change in appetite 1 2  Feeling bad or failure about yourself  2 3  Trouble concentrating 2 2  Moving slowly or fidgety/restless 2 2  Suicidal thoughts 0 0  PHQ-9 Score 19 19  Difficult doing work/chores Very difficult    Anhedonia: no Weight changes: no Insomnia: yes hard to fall asleep  Hypersomnia: yes Fatigue/loss of energy: yes Feelings of worthlessness: no Feelings of guilt: no Impaired concentration/indecisiveness: no Suicidal ideations: no  Crying spells: no Recent Stressors/Life Changes: no   Relationship problems: no   Family stress: no     Financial stress: no    Job stress: no    Recent death/loss: no  SLEEP APNEA Sleep apnea status: still trying different masks Duration: months Satisfied with current treatment?:  no CPAP use:  yes Sleep quality with CPAP use:  good Treament compliance:good compliance Wakes feeling refreshed:  no Daytime hypersomnolence:  yes Fatigue:  yes Insomnia:  yes Good sleep hygiene:  yes Difficulty falling asleep:  yes Difficulty  staying asleep:  no Snoring bothers bed partner:  no Observed apnea by bed partner: no Obesity:  no Hypertension: no  Pulmonary hypertension:  no Coronary artery disease:  no   Relevant past medical, surgical, family and social history reviewed and updated as indicated. Interim medical history since our last visit reviewed. Allergies and medications reviewed and updated.  Review of Systems  Constitutional: Negative.   Respiratory: Negative.    Cardiovascular: Negative.   Gastrointestinal:  Positive for abdominal pain. Negative for abdominal distention, anal bleeding, blood in stool, constipation, diarrhea, nausea, rectal pain and vomiting.  Musculoskeletal: Negative.   Skin: Negative.   Psychiatric/Behavioral:  Positive for dysphoric mood. Negative for agitation, behavioral problems, confusion, decreased concentration, hallucinations, self-injury, sleep disturbance and suicidal ideas. The patient is nervous/anxious. The patient is not hyperactive.     Per HPI unless specifically indicated above     Objective:    BP 111/73   Pulse 73   Ht 5\' 8"  (1.727 m)   Wt 193 lb (87.5 kg)   SpO2 97%   BMI 29.35 kg/m   Wt Readings from Last 3 Encounters:  08/05/23 193 lb (87.5 kg)  07/15/23 193 lb 12.8 oz (87.9 kg)  07/02/23 191 lb 12.8 oz (87 kg)    Physical Exam Vitals and nursing note reviewed.  Constitutional:      General: She is not in acute  distress.    Appearance: Normal appearance. She is not ill-appearing, toxic-appearing or diaphoretic.  HENT:     Head: Normocephalic and atraumatic.     Right Ear: External ear normal.     Left Ear: External ear normal.     Nose: Nose normal.     Mouth/Throat:     Mouth: Mucous membranes are moist.     Pharynx: Oropharynx is clear.  Eyes:     General: No scleral icterus.       Right eye: No discharge.        Left eye: No discharge.     Extraocular Movements: Extraocular movements intact.     Conjunctiva/sclera: Conjunctivae normal.      Pupils: Pupils are equal, round, and reactive to light.  Cardiovascular:     Rate and Rhythm: Normal rate and regular rhythm.     Pulses: Normal pulses.     Heart sounds: Normal heart sounds. No murmur heard.    No friction rub. No gallop.  Pulmonary:     Effort: Pulmonary effort is normal. No respiratory distress.     Breath sounds: Normal breath sounds. No stridor. No wheezing, rhonchi or rales.  Chest:     Chest wall: No tenderness.  Musculoskeletal:        General: Normal range of motion.     Cervical back: Normal range of motion and neck supple.  Skin:    General: Skin is warm and dry.     Capillary Refill: Capillary refill takes less than 2 seconds.     Coloration: Skin is not jaundiced or pale.     Findings: No bruising, erythema, lesion or rash.  Neurological:     General: No focal deficit present.     Mental Status: She is alert and oriented to person, place, and time. Mental status is at baseline.  Psychiatric:        Mood and Affect: Mood normal.        Behavior: Behavior normal.        Thought Content: Thought content normal.        Judgment: Judgment normal.     Results for orders placed or performed in visit on 07/16/23  ECHOCARDIOGRAM COMPLETE  Result Value Ref Range   AR max vel 3.01 cm2   AV Peak grad 5.9 mmHg   Ao pk vel 1.21 m/s   S' Lateral 3.20 cm   Area-P 1/2 3.71 cm2   AV Area VTI 2.98 cm2   AV Mean grad 3.0 mmHg   Single Plane A4C EF 55.6 %   Single Plane A2C EF 57.5 %   Calc EF 56.6 %   AV Area mean vel 3.06 cm2   Est EF 60 - 65%       Assessment & Plan:   Problem List Items Addressed This Visit       Respiratory   OSA (obstructive sleep apnea)    No better. Continue to work on finding comfortable mask. Call with any concerns.         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 No significant change. Will increase her lexapro to 20mg  and recheck in 1 month.       Relevant Medications    escitalopram (LEXAPRO) 20 MG tablet   Family history of heart disease    Will start low dose crestor at cardiology's recommendation. Recheck in about a month at her physical.      Other  Visit Diagnoses     Epigastric pain       Cardiology exam normal. Will start her on protonix and recheck in 1 month. Call with any concerns. If not better, consider further imaging.   Screening for colon cancer       Cologuard ordered.   Relevant Orders   Cologuard   Encounter for screening mammogram for malignant neoplasm of breast       Mammogram ordered today.   Relevant Orders   MM 3D SCREENING MAMMOGRAM BILATERAL BREAST        Follow up plan: Return in about 4 weeks (around 09/02/2023) for physical.

## 2023-08-05 NOTE — Assessment & Plan Note (Signed)
Will start low dose crestor at cardiology's recommendation. Recheck in about a month at her physical.

## 2023-08-05 NOTE — Assessment & Plan Note (Signed)
Stable. No significant change. Will increase her lexapro to 20mg  and recheck in 1 month.

## 2023-08-05 NOTE — Assessment & Plan Note (Signed)
No better. Continue to work on finding comfortable mask. Call with any concerns.

## 2023-08-12 ENCOUNTER — Encounter: Payer: Self-pay | Admitting: Family

## 2023-08-14 ENCOUNTER — Encounter: Payer: Self-pay | Admitting: Family Medicine

## 2023-08-14 DIAGNOSIS — R1013 Epigastric pain: Secondary | ICD-10-CM

## 2023-08-22 DIAGNOSIS — Z1211 Encounter for screening for malignant neoplasm of colon: Secondary | ICD-10-CM | POA: Diagnosis not present

## 2023-08-27 ENCOUNTER — Ambulatory Visit (INDEPENDENT_AMBULATORY_CARE_PROVIDER_SITE_OTHER): Payer: BC Managed Care – PPO | Admitting: Family Medicine

## 2023-08-27 ENCOUNTER — Encounter: Payer: Self-pay | Admitting: Family Medicine

## 2023-08-27 ENCOUNTER — Other Ambulatory Visit (HOSPITAL_COMMUNITY)
Admission: RE | Admit: 2023-08-27 | Discharge: 2023-08-27 | Disposition: A | Discharge status: Home / Self Care | Payer: BC Managed Care – PPO | Source: Ambulatory Visit | Attending: Family Medicine | Admitting: Family Medicine

## 2023-08-27 VITALS — BP 111/76 | HR 60 | Temp 97.8°F | Ht 67.0 in | Wt 194.8 lb

## 2023-08-27 DIAGNOSIS — R1013 Epigastric pain: Secondary | ICD-10-CM

## 2023-08-27 DIAGNOSIS — F331 Major depressive disorder, recurrent, moderate: Secondary | ICD-10-CM | POA: Diagnosis not present

## 2023-08-27 DIAGNOSIS — B9689 Other specified bacterial agents as the cause of diseases classified elsewhere: Secondary | ICD-10-CM

## 2023-08-27 DIAGNOSIS — R300 Dysuria: Secondary | ICD-10-CM

## 2023-08-27 DIAGNOSIS — N760 Acute vaginitis: Secondary | ICD-10-CM | POA: Diagnosis not present

## 2023-08-27 DIAGNOSIS — Z0000 Encounter for general adult medical examination without abnormal findings: Secondary | ICD-10-CM | POA: Diagnosis not present

## 2023-08-27 LAB — URINALYSIS, ROUTINE W REFLEX MICROSCOPIC
Bilirubin, UA: NEGATIVE
Glucose, UA: NEGATIVE
Ketones, UA: NEGATIVE
Leukocytes,UA: NEGATIVE
Nitrite, UA: NEGATIVE
Protein,UA: NEGATIVE
Specific Gravity, UA: 1.02 (ref 1.005–1.030)
Urobilinogen, Ur: 1 mg/dL (ref 0.2–1.0)
pH, UA: 6 (ref 5.0–7.5)

## 2023-08-27 LAB — MICROSCOPIC EXAMINATION
Bacteria, UA: NONE SEEN
WBC, UA: NONE SEEN /[HPF] (ref 0–5)

## 2023-08-27 LAB — WET PREP FOR TRICH, YEAST, CLUE
Clue Cell Exam: POSITIVE — AB
Trichomonas Exam: NEGATIVE
Yeast Exam: NEGATIVE

## 2023-08-27 MED ORDER — BUPROPION HCL ER (SR) 150 MG PO TB12: ORAL_TABLET | ORAL | 3 refills | Status: DC

## 2023-08-27 MED ORDER — METRONIDAZOLE 500 MG PO TABS: 500.0000 mg | ORAL_TABLET | Freq: Two times a day (BID) | ORAL | 0 refills | Status: DC

## 2023-08-27 MED ORDER — ESCITALOPRAM OXALATE 20 MG PO TABS: 20.0000 mg | ORAL_TABLET | Freq: Every day | ORAL | 1 refills | Status: DC

## 2023-08-27 NOTE — Progress Notes (Signed)
BP 111/76   Pulse 60   Temp 97.8 F (36.6 C) (Oral)   Ht 5\' 7"  (1.702 m)   Wt 194 lb 12.8 oz (88.4 kg)   SpO2 99%   BMI 30.51 kg/m    Subjective:    Patient ID: Sherri Kelly, female    DOB: Aug 31, 1976, 47 y.o.   MRN: [id number]  HPI: Sherri Kelly is a 47 y.o. female presenting on 08/27/2023 for comprehensive medical examination. Current medical complaints include:  EPIGASTRIC PAIN GERD control status: better Satisfied with current treatment? unsure Heartburn frequency: at random- 3x in 1 week, now like 1x every 2 weeks Medication side effects: no  Medication compliance: excellent Dysphagia: no Odynophagia:  no Hematemesis: no Blood in stool: no EGD: no  DEPRESSION Mood status: stable Satisfied with current treatment?: no Symptom severity: moderate  Duration of current treatment : months Side effects: no Medication compliance: good compliance Psychotherapy/counseling: no  Previous psychiatric medications: lexapro Depressed mood: yes Anxious mood: yes Anhedonia: no Significant weight loss or gain: no Insomnia: no  Fatigue: yes Feelings of worthlessness or guilt: no Impaired concentration/indecisiveness: no Suicidal ideations: no Hopelessness: no Crying spells: no    08/27/2023    8:54 AM 08/05/2023   10:02 AM 12/02/2022    2:48 PM  Depression screen PHQ 2/9  Decreased Interest 2 3 3   Down, Depressed, Hopeless 2 3 2   PHQ - 2 Score 4 6 5   Altered sleeping 2 3 2   Tired, decreased energy 3 3 3   Change in appetite 1 1 2   Feeling bad or failure about yourself  1 2 3   Trouble concentrating 2 2 2   Moving slowly or fidgety/restless 1 2 2   Suicidal thoughts 0 0 0  PHQ-9 Score 14 19 19   Difficult doing work/chores Somewhat difficult Very difficult    Menopausal Symptoms: yes- about 1x a week  Depression Screen done today and results listed below:     08/27/2023    8:54 AM 08/05/2023   10:02 AM 12/02/2022    2:48 PM  Depression screen PHQ 2/9  Decreased  Interest 2 3 3   Down, Depressed, Hopeless 2 3 2   PHQ - 2 Score 4 6 5   Altered sleeping 2 3 2   Tired, decreased energy 3 3 3   Change in appetite 1 1 2   Feeling bad or failure about yourself  1 2 3   Trouble concentrating 2 2 2   Moving slowly or fidgety/restless 1 2 2   Suicidal thoughts 0 0 0  PHQ-9 Score 14 19 19   Difficult doing work/chores Somewhat difficult Very difficult     Past Medical History:  Past Medical History:  Diagnosis Date   Anxiety 2020   Depression 2020   Plantar fasciitis    Sleep apnea 03/2023   currently untreated    Surgical History:  Past Surgical History:  Procedure Laterality Date   NO PAST SURGERIES      Medications:  Current Outpatient Medications on File Prior to Visit  Medication Sig   ergocalciferol (VITAMIN D2) 1.25 MG (50000 UT) capsule Take 1 capsule (50,000 Units total) by mouth once a week.   pantoprazole (PROTONIX) 40 MG tablet Take 1 tablet (40 mg total) by mouth daily.   rosuvastatin (CRESTOR) 5 MG tablet Take 1 tablet (5 mg total) by mouth daily.   traZODone (DESYREL) 50 MG tablet TAKE 1 TABLET BY MOUTH AT BEDTIME   No current facility-administered medications on file prior to visit.    Allergies:  No Known Allergies  Social History:  Social History   Socioeconomic History   Marital status: Single    Spouse name: Not on file   Number of children: Not on file   Years of education: Not on file   Highest education level: GED or equivalent  Occupational History   Not on file  Tobacco Use   Smoking status: Never   Smokeless tobacco: Never   Tobacco comments:    Have never smoked a cigarette.  Substance and Sexual Activity   Alcohol use: Yes    Comment: Occasionally   Drug use: Never   Sexual activity: Not Currently    Birth control/protection: Abstinence, Post-menopausal    Comment: Haven't had a period in several years  Other Topics Concern   Not on file  Social History Narrative   Not on file   Social  Determinants of Health   Financial Resource Strain: Medium Risk (08/01/2023)   Overall Financial Resource Strain (CARDIA)    Difficulty of Paying Living Expenses: Somewhat hard  Food Insecurity: Food Insecurity Present (08/01/2023)   Hunger Vital Sign    Worried About Running Out of Food in the Last Year: Never true    Ran Out of Food in the Last Year: Sometimes true  Transportation Needs: No Transportation Needs (08/01/2023)   PRAPARE - Administrator, Civil Service (Medical): No    Lack of Transportation (Non-Medical): No  Physical Activity: Unknown (08/01/2023)   Exercise Vital Sign    Days of Exercise per Week: 2 days    Minutes of Exercise per Session: Patient declined  Stress: Stress Concern Present (08/01/2023)   [employer] of Occupational Health - Occupational Stress Questionnaire    Feeling of Stress : Very much  Social Connections: Moderately Isolated (08/01/2023)   Social Connection and Isolation Panel [NHANES]    Frequency of Communication with Friends and Family: Once a week    Frequency of Social Gatherings with Friends and Family: Twice a week    Attends Religious Services: 1 to 4 times per year    Active Member of [employer] or Organizations: No    Attends Engineer, structural: Not on file    Marital Status: Never married  Intimate Partner Violence: Not on file   Social History   Tobacco Use  Smoking Status Never  Smokeless Tobacco Never  Tobacco Comments   Have never smoked a cigarette.   Social History   Substance and Sexual Activity  Alcohol Use Yes   Comment: Occasionally    Family History:  Family History  Problem Relation Age of Onset   Cancer Mother 40       Lung   Alcohol abuse Mother    Varicose Veins Mother    Hypertension Father    Alcohol abuse Father    Depression Father    Obesity Father    Stroke Father    Varicose Veins Father    Thyroid disease Sister    ADD / ADHD Sister    Rheum arthritis Brother     Gallbladder disease Brother    Diabetes Maternal Grandmother    ADD / ADHD Daughter    Diabetes Paternal Grandmother    Obesity Paternal Grandmother    Heart disease Paternal Grandmother    ADD / ADHD Daughter    ADD / ADHD Sister    Anxiety disorder Paternal Aunt    COPD Paternal Aunt    Depression Paternal Aunt    Arthritis Brother  Past medical history, surgical history, medications, allergies, family history and social history reviewed with patient today and changes made to appropriate areas of the chart.   Review of Systems  Constitutional:  Positive for diaphoresis. Negative for chills, fever, malaise/fatigue and weight loss.  HENT: Negative.    Eyes:  Positive for blurred vision. Negative for double vision, photophobia, pain, discharge and redness.  Respiratory: Negative.    Cardiovascular: Negative.   Gastrointestinal:  Positive for abdominal pain and constipation. Negative for blood in stool, diarrhea, heartburn, melena, nausea and vomiting.  Genitourinary:  Positive for dysuria. Negative for flank pain, frequency, hematuria and urgency.  Musculoskeletal: Negative.   Skin: Negative.   Neurological: Negative.   Endo/Heme/Allergies:  Negative for environmental allergies and polydipsia. Bruises/bleeds easily.  Psychiatric/Behavioral: Negative.     All other ROS negative except what is listed above and in the HPI.      Objective:    BP 111/76   Pulse 60   Temp 97.8 F (36.6 C) (Oral)   Ht 5\' 7"  (1.702 m)   Wt 194 lb 12.8 oz (88.4 kg)   SpO2 99%   BMI 30.51 kg/m   Wt Readings from Last 3 Encounters:  08/27/23 194 lb 12.8 oz (88.4 kg)  08/05/23 193 lb (87.5 kg)  07/15/23 193 lb 12.8 oz (87.9 kg)    Physical Exam Vitals and nursing note reviewed. Exam conducted with a chaperone present.  Constitutional:      General: She is not in acute distress.    Appearance: Normal appearance. She is not ill-appearing, toxic-appearing or diaphoretic.  HENT:     Head:  Normocephalic and atraumatic.     Right Ear: Tympanic membrane, ear canal and external ear normal. There is no impacted cerumen.     Left Ear: Tympanic membrane, ear canal and external ear normal. There is no impacted cerumen.     Nose: Nose normal. No congestion or rhinorrhea.     Mouth/Throat:     Mouth: Mucous membranes are moist.     Pharynx: Oropharynx is clear. No oropharyngeal exudate or posterior oropharyngeal erythema.  Eyes:     General: No scleral icterus.       Right eye: No discharge.        Left eye: No discharge.     Extraocular Movements: Extraocular movements intact.     Conjunctiva/sclera: Conjunctivae normal.     Pupils: Pupils are equal, round, and reactive to light.  Neck:     Vascular: No carotid bruit.  Cardiovascular:     Rate and Rhythm: Normal rate and regular rhythm.     Pulses: Normal pulses.     Heart sounds: No murmur heard.    No friction rub. No gallop.  Pulmonary:     Effort: Pulmonary effort is normal. No respiratory distress.     Breath sounds: Normal breath sounds. No stridor. No wheezing, rhonchi or rales.  Chest:     Chest wall: No tenderness.  Abdominal:     General: Abdomen is flat. Bowel sounds are normal. There is no distension.     Palpations: Abdomen is soft. There is no mass.     Tenderness: There is no abdominal tenderness. There is no right CVA tenderness, left CVA tenderness, guarding or rebound.     Hernia: No hernia is present.  Genitourinary:    Labia:        Right: No rash, tenderness, lesion or injury.        Left: No rash,  tenderness, lesion or injury.      Vagina: No signs of injury and foreign body. Vaginal discharge present. No erythema, tenderness, bleeding, lesions or prolapsed vaginal walls.     Cervix: Normal.     Uterus: Normal.      Adnexa: Right adnexa normal and left adnexa normal.  Musculoskeletal:        General: No swelling, tenderness, deformity or signs of injury.     Cervical back: Normal range of motion  and neck supple. No rigidity. No muscular tenderness.     Right lower leg: No edema.     Left lower leg: No edema.  Lymphadenopathy:     Cervical: No cervical adenopathy.  Skin:    General: Skin is warm and dry.     Capillary Refill: Capillary refill takes less than 2 seconds.     Coloration: Skin is not jaundiced or pale.     Findings: No bruising, erythema, lesion or rash.  Neurological:     General: No focal deficit present.     Mental Status: She is alert and oriented to person, place, and time. Mental status is at baseline.     Cranial Nerves: No cranial nerve deficit.     Sensory: No sensory deficit.     Motor: No weakness.     Coordination: Coordination normal.     Gait: Gait normal.     Deep Tendon Reflexes: Reflexes normal.  Psychiatric:        Mood and Affect: Mood normal.        Behavior: Behavior normal.        Thought Content: Thought content normal.        Judgment: Judgment normal.     Results for orders placed or performed in visit on 08/27/23  WET PREP FOR TRICH, YEAST, CLUE   Specimen: Urine   Urine  Result Value Ref Range   Trichomonas Exam Negative Negative   Yeast Exam Negative Negative   Clue Cell Exam Positive (A) Negative  Microscopic Examination   Urine  Result Value Ref Range   WBC, UA None seen 0 - 5 /hpf   RBC, Urine 0-2 0 - 2 /hpf   Epithelial Cells (non renal) 0-10 0 - 10 /hpf   Mucus, UA Present (A) Not Estab.   Bacteria, UA None seen None seen/Few  Urinalysis, Routine w reflex microscopic  Result Value Ref Range   Specific Gravity, UA 1.020 1.005 - 1.030   pH, UA 6.0 5.0 - 7.5   Color, UA Yellow Yellow   Appearance Ur Cloudy (A) Clear   Leukocytes,UA Negative Negative   Protein,UA Negative Negative/Trace   Glucose, UA Negative Negative   Ketones, UA Negative Negative   RBC, UA Trace (A) Negative   Bilirubin, UA Negative Negative   Urobilinogen, Ur 1.0 0.2 - 1.0 mg/dL   Nitrite, UA Negative Negative   Microscopic Examination See  below:       Assessment & Plan:   Problem List Items Addressed This Visit       Other   Moderate recurrent major depression (HCC)    Not doing great. Will add in wellbutrin and recheck in about 6 weeks. Call with any concerns. Continue to monitor.       Relevant Medications   buPROPion (WELLBUTRIN SR) 150 MG 12 hr tablet   escitalopram (LEXAPRO) 20 MG tablet   Other Visit Diagnoses     Routine general medical examination at a health care facility    -  Primary   Vaccines up to date/declined. Screening labs checked today. Pap done. Mammo scheduled. Colonoscopy ordered. Continue diet and exercise. Call with any concerns.   Relevant Orders   Cytology - PAP   Dysuria       UA clear- + BV   Relevant Orders   WET PREP FOR TRICH, YEAST, CLUE (Completed)   Urinalysis, Routine w reflex microscopic (Completed)   BV (bacterial vaginosis)       Will treat with flagyl. Call with any concerns or if not getting better.   Relevant Medications   metroNIDAZOLE (FLAGYL) 500 MG tablet   Epigastric pain       Improved. Continue pantoprazole. Recheck next visit. Call with any concerns.        Follow up plan: Return in about 6 weeks (around 10/08/2023).   LABORATORY TESTING:  - Pap smear: pap done  IMMUNIZATIONS:   - Tdap: Tetanus vaccination status reviewed: last tetanus booster within 10 years. - Influenza: Refused - Pneumovax: Not applicable - Prevnar: Not applicable - COVID: Refused - HPV: Not applicable - Shingrix vaccine: Not applicable  SCREENING: -Mammogram:  Scheduled   - Colonoscopy: Awaiting cologuard   PATIENT COUNSELING:   Advised to take 1 mg of folate supplement per day if capable of pregnancy.   Sexuality: Discussed sexually transmitted diseases, partner selection, use of condoms, avoidance of unintended pregnancy  and contraceptive alternatives.   Advised to avoid cigarette smoking.  I discussed with the patient that most people either abstain from alcohol  or drink within safe limits (<=14/week and <=4 drinks/occasion for males, <=7/weeks and <= 3 drinks/occasion for females) and that the risk for alcohol disorders and other health effects rises proportionally with the number of drinks per week and how often a drinker exceeds daily limits.  Discussed cessation/primary prevention of drug use and availability of treatment for abuse.   Diet: Encouraged to adjust caloric intake to maintain  or achieve ideal body weight, to reduce intake of dietary saturated fat and total fat, to limit sodium intake by avoiding high sodium foods and not adding table salt, and to maintain adequate dietary potassium and calcium preferably from fresh fruits, vegetables, and low-fat dairy products.    stressed the importance of regular exercise  Injury prevention: Discussed safety belts, safety helmets, smoke detector, smoking near bedding or upholstery.   Dental health: Discussed importance of regular tooth brushing, flossing, and dental visits.    NEXT PREVENTATIVE PHYSICAL DUE IN 1 YEAR. Return in about 6 weeks (around 10/08/2023).

## 2023-08-28 ENCOUNTER — Ambulatory Visit: Payer: BC Managed Care – PPO

## 2023-08-29 LAB — COLOGUARD: COLOGUARD: NEGATIVE

## 2023-08-31 NOTE — Assessment & Plan Note (Signed)
Not doing great. Will add in wellbutrin and recheck in about 6 weeks. Call with any concerns. Continue to monitor.

## 2023-09-01 LAB — CYTOLOGY - PAP
Adequacy: ABSENT
Comment: NEGATIVE
Diagnosis: NEGATIVE
High risk HPV: NEGATIVE

## 2023-09-03 ENCOUNTER — Encounter: Payer: Self-pay | Admitting: Family Medicine

## 2023-09-11 ENCOUNTER — Other Ambulatory Visit: Payer: Self-pay | Admitting: Family

## 2023-09-11 DIAGNOSIS — F411 Generalized anxiety disorder: Secondary | ICD-10-CM

## 2023-09-11 DIAGNOSIS — F331 Major depressive disorder, recurrent, moderate: Secondary | ICD-10-CM

## 2023-09-17 ENCOUNTER — Other Ambulatory Visit: Payer: Self-pay | Admitting: Family Medicine

## 2023-09-17 DIAGNOSIS — F331 Major depressive disorder, recurrent, moderate: Secondary | ICD-10-CM

## 2023-09-17 DIAGNOSIS — F411 Generalized anxiety disorder: Secondary | ICD-10-CM

## 2023-09-17 MED ORDER — TRAZODONE HCL 50 MG PO TABS
50.0000 mg | ORAL_TABLET | Freq: Every day | ORAL | 1 refills | Status: DC
Start: 2023-09-17 — End: 2024-02-06

## 2023-10-08 ENCOUNTER — Telehealth (INDEPENDENT_AMBULATORY_CARE_PROVIDER_SITE_OTHER): Payer: BC Managed Care – PPO | Admitting: Family Medicine

## 2023-10-08 ENCOUNTER — Encounter: Payer: Self-pay | Admitting: Family Medicine

## 2023-10-08 DIAGNOSIS — F331 Major depressive disorder, recurrent, moderate: Secondary | ICD-10-CM

## 2023-10-08 MED ORDER — BUPROPION HCL ER (SR) 200 MG PO TB12: 200.0000 mg | ORAL_TABLET | Freq: Two times a day (BID) | ORAL | 3 refills | Status: DC

## 2023-10-08 MED ORDER — PANTOPRAZOLE SODIUM 40 MG PO TBEC: 40.0000 mg | DELAYED_RELEASE_TABLET | Freq: Every day | ORAL | 1 refills | Status: DC

## 2023-10-08 NOTE — Assessment & Plan Note (Signed)
Improved, but having trouble with the time of year. Will go up to 200mg  BID on her wellbutrin and recheck 1 month. Call with any concerns.

## 2023-10-08 NOTE — Progress Notes (Signed)
There were no vitals taken for this visit.   Subjective:    Patient ID: Sherri Kelly, female    DOB: 09-Mar-1976, 47 y.o.   MRN: [id number]  HPI: Sherri Kelly is a 47 y.o. female  Chief Complaint  Patient presents with   Anxiety    Patient says she had a couple of weeks, where she felt great and she says since she feels like that has falling off. Patient says the feelings are starting to creep back in of struggling to get out of bed and so on.    ANXIETY/DEPRESSION Duration: chronic Status:was better for a couple of weeks and then started to feel a bit worse. She usually has some trouble in December due to family losses during this month Anxious mood: yes  Excessive worrying: yes Irritability: no  Sweating: no Nausea: no Palpitations:no Hyperventilation: no Panic attacks: no Agoraphobia: no  Obscessions/compulsions: no Depressed mood: yes    10/08/2023    8:48 AM 08/27/2023    8:54 AM 08/05/2023   10:02 AM 12/02/2022    2:48 PM  Depression screen PHQ 2/9  Decreased Interest 2 2 3 3   Down, Depressed, Hopeless 1 2 3 2   PHQ - 2 Score 3 4 6 5   Altered sleeping 2 2 3 2   Tired, decreased energy 3 3 3 3   Change in appetite 1 1 1 2   Feeling bad or failure about yourself  1 1 2 3   Trouble concentrating 3 2 2 2   Moving slowly or fidgety/restless 2 1 2 2   Suicidal thoughts 0 0 0 0  PHQ-9 Score 15 14 19 19   Difficult doing work/chores Somewhat difficult Somewhat difficult Very difficult       10/08/2023    8:50 AM 08/27/2023    8:54 AM 08/05/2023   10:03 AM 12/02/2022    2:49 PM  GAD 7 : Generalized Anxiety Score  Nervous, Anxious, on Edge 3 3 3 3   Control/stop worrying 3 3 3 3   Worry too much - different things 3 3 3 3   Trouble relaxing 3 3 3 3   Restless 3 3 3 3   Easily annoyed or irritable 2 3 2 3   Afraid - awful might happen 3 3 3 3   Total GAD 7 Score 20 21 20 21   Anxiety Difficulty Somewhat difficult Very difficult Very difficult Very difficult   Anhedonia:  no Weight changes: no Insomnia: no   Hypersomnia: no Fatigue/loss of energy: yes Feelings of worthlessness: no Feelings of guilt: no Impaired concentration/indecisiveness: no Suicidal ideations: no  Crying spells: yes Recent Stressors/Life Changes: no   Relationship problems: no   Family stress: no     Financial stress: no    Job stress: no    Recent death/loss: no  Relevant past medical, surgical, family and social history reviewed and updated as indicated. Interim medical history since our last visit reviewed. Allergies and medications reviewed and updated.  Review of Systems  Constitutional: Negative.   Respiratory: Negative.    Cardiovascular: Negative.   Gastrointestinal: Negative.   Skin: Negative.   Psychiatric/Behavioral:  Positive for dysphoric mood. Negative for agitation, behavioral problems, confusion, decreased concentration, hallucinations, self-injury, sleep disturbance and suicidal ideas. The patient is nervous/anxious. The patient is not hyperactive.     Per HPI unless specifically indicated above     Objective:    There were no vitals taken for this visit.  Wt Readings from Last 3 Encounters:  08/27/23 194 lb 12.8 oz (88.4 kg)  08/05/23 193 lb (87.5 kg)  07/15/23 193 lb 12.8 oz (87.9 kg)    Physical Exam Vitals and nursing note reviewed.  Constitutional:      General: She is not in acute distress.    Appearance: Normal appearance. She is not ill-appearing, toxic-appearing or diaphoretic.  HENT:     Head: Normocephalic and atraumatic.     Right Ear: External ear normal.     Left Ear: External ear normal.     Nose: Nose normal.     Mouth/Throat:     Mouth: Mucous membranes are moist.     Pharynx: Oropharynx is clear.  Eyes:     General: No scleral icterus.       Right eye: No discharge.        Left eye: No discharge.     Conjunctiva/sclera: Conjunctivae normal.     Pupils: Pupils are equal, round, and reactive to light.  Pulmonary:      Effort: Pulmonary effort is normal. No respiratory distress.     Comments: Speaking in full sentences Musculoskeletal:        General: Normal range of motion.     Cervical back: Normal range of motion.  Skin:    Coloration: Skin is not jaundiced or pale.     Findings: No bruising, erythema, lesion or rash.  Neurological:     Mental Status: She is alert and oriented to person, place, and time. Mental status is at baseline.  Psychiatric:        Mood and Affect: Mood normal.        Behavior: Behavior normal.        Thought Content: Thought content normal.        Judgment: Judgment normal.     Results for orders placed or performed in visit on 08/27/23  WET PREP FOR TRICH, YEAST, CLUE   Collection Time: 08/27/23  9:20 AM   Specimen: Urine   Urine  Result Value Ref Range   Trichomonas Exam Negative Negative   Yeast Exam Negative Negative   Clue Cell Exam Positive (A) Negative  Microscopic Examination   Collection Time: 08/27/23  9:20 AM   Urine  Result Value Ref Range   WBC, UA None seen 0 - 5 /hpf   RBC, Urine 0-2 0 - 2 /hpf   Epithelial Cells (non renal) 0-10 0 - 10 /hpf   Mucus, UA Present (A) Not Estab.   Bacteria, UA None seen None seen/Few  Urinalysis, Routine w reflex microscopic   Collection Time: 08/27/23  9:20 AM  Result Value Ref Range   Specific Gravity, UA 1.020 1.005 - 1.030   pH, UA 6.0 5.0 - 7.5   Color, UA Yellow Yellow   Appearance Ur Cloudy (A) Clear   Leukocytes,UA Negative Negative   Protein,UA Negative Negative/Trace   Glucose, UA Negative Negative   Ketones, UA Negative Negative   RBC, UA Trace (A) Negative   Bilirubin, UA Negative Negative   Urobilinogen, Ur 1.0 0.2 - 1.0 mg/dL   Nitrite, UA Negative Negative   Microscopic Examination See below:   Cytology - PAP   Collection Time: 08/27/23  9:39 AM  Result Value Ref Range   High risk HPV Negative    Adequacy      Satisfactory for evaluation; transformation zone component ABSENT.   Diagnosis       - Negative for intraepithelial lesion or malignancy (NILM)   Comment Normal Reference Range HPV - Negative  Assessment & Plan:   Problem List Items Addressed This Visit       Other   Moderate recurrent major depression (HCC) - Primary   Improved, but having trouble with the time of year. Will go up to 200mg  BID on her wellbutrin and recheck 1 month. Call with any concerns.      Relevant Medications   buPROPion (WELLBUTRIN SR) 200 MG 12 hr tablet     Follow up plan: Return in about 4 weeks (around 11/05/2023).    This visit was completed via video visit through MyChart due to the restrictions of the COVID-19 pandemic. All issues as above were discussed and addressed. Physical exam was done as above through visual confirmation on video through MyChart. If it was felt that the patient should be evaluated in the office, they were directed there. The patient verbally consented to this visit. Location of the patient: home Location of the provider: work Those involved with this call:  Provider: Olevia Perches, DO CMA: Malen Gauze, CMA Front Desk/Registration:  Servando Snare   Time spent on call:  15 minutes with patient face to face via video conference. More than 50% of this time was spent in counseling and coordination of care. 23 minutes total spent in review of patient's record and preparation of their chart.

## 2023-10-09 NOTE — Progress Notes (Signed)
Scheduled appointment on 11/04/2023 @ 10:20 am.

## 2023-10-24 ENCOUNTER — Encounter: Payer: Self-pay | Admitting: Family Medicine

## 2023-10-29 ENCOUNTER — Ambulatory Visit
Admission: RE | Admit: 2023-10-29 | Discharge: 2023-10-29 | Disposition: A | Discharge status: Home / Self Care | Payer: BC Managed Care – PPO | Source: Ambulatory Visit | Attending: Family Medicine | Admitting: Family Medicine

## 2023-10-29 ENCOUNTER — Encounter: Payer: Self-pay | Admitting: Radiology

## 2023-10-29 DIAGNOSIS — Z1231 Encounter for screening mammogram for malignant neoplasm of breast: Secondary | ICD-10-CM | POA: Insufficient documentation

## 2023-10-30 ENCOUNTER — Encounter: Payer: Self-pay | Admitting: Family Medicine

## 2023-11-02 NOTE — Telephone Encounter (Signed)
 appt

## 2023-11-04 ENCOUNTER — Ambulatory Visit: Payer: BC Managed Care – PPO | Admitting: Family Medicine

## 2023-11-04 NOTE — Telephone Encounter (Signed)
 Patient okay with discussing age spot issue during visit on 11/12/23.

## 2023-11-12 ENCOUNTER — Encounter: Payer: Self-pay | Admitting: Family Medicine

## 2023-11-12 ENCOUNTER — Telehealth: Payer: BC Managed Care – PPO | Admitting: Family Medicine

## 2023-11-12 DIAGNOSIS — F331 Major depressive disorder, recurrent, moderate: Secondary | ICD-10-CM

## 2023-11-12 DIAGNOSIS — L814 Other melanin hyperpigmentation: Secondary | ICD-10-CM

## 2023-11-12 MED ORDER — BUPROPION HCL ER (XL) 300 MG PO TB24: 300.0000 mg | ORAL_TABLET | Freq: Every day | ORAL | 0 refills | Status: DC

## 2023-11-12 MED ORDER — TRETINOIN 0.01 % EX GEL: Freq: Every day | CUTANEOUS | 0 refills | Status: DC

## 2023-11-12 NOTE — Progress Notes (Signed)
There were no vitals taken for this visit.   Subjective:    Patient ID: Sherri Kelly, female    DOB: [birth year], 48 y.o.   MRN: [id number]  HPI: Sherri Kelly is a 48 y.o. female  Chief Complaint  Patient presents with   Mood   Age Spots    Patient says she has noticed this for a while, but here recently she had noticed them more lately. Patient says she has tried several over the counter creams and ointments and she has tried [employer], [employer].    ANXIETY/STRESS Duration: chronic Status:better Anxious mood: yes  Excessive worrying: yes Irritability: no  Sweating: no Nausea: no Palpitations:no Hyperventilation: no Panic attacks: no Agoraphobia: no  Obscessions/compulsions: no Depressed mood: yes    11/12/2023   10:13 AM 10/08/2023    8:48 AM 08/27/2023    8:54 AM 08/05/2023   10:02 AM 12/02/2022    2:48 PM  Depression screen PHQ 2/9  Decreased Interest 1 2 2 3 3   Down, Depressed, Hopeless 1 1 2 3 2   PHQ - 2 Score 2 3 4 6 5   Altered sleeping 3 2 2 3 2   Tired, decreased energy 3 3 3 3 3   Change in appetite 1 1 1 1 2   Feeling bad or failure about yourself  1 1 1 2 3   Trouble concentrating 3 3 2 2 2   Moving slowly or fidgety/restless 2 2 1 2 2   Suicidal thoughts 0 0 0 0 0  PHQ-9 Score 15 15 14 19 19   Difficult doing work/chores Somewhat difficult Somewhat difficult Somewhat difficult Very difficult       11/12/2023   10:15 AM 10/08/2023    8:50 AM 08/27/2023    8:54 AM 08/05/2023   10:03 AM  GAD 7 : Generalized Anxiety Score  Nervous, Anxious, on Edge 2 3 3 3   Control/stop worrying 2 3 3 3   Worry too much - different things 2 3 3 3   Trouble relaxing 1 3 3 3   Restless 3 3 3 3   Easily annoyed or irritable 1 2 3 2   Afraid - awful might happen 1 3 3 3   Total GAD 7 Score 12 20 21 20   Anxiety Difficulty Somewhat difficult Somewhat difficult Very difficult Very difficult   Anhedonia: no Weight changes: no Insomnia: no   Hypersomnia: no Fatigue/loss of energy:  yes Feelings of worthlessness: yes Feelings of guilt: yes Impaired concentration/indecisiveness: no Suicidal ideations: no  Crying spells: no Recent Stressors/Life Changes: no   Relationship problems: no   Family stress: no     Financial stress: no    Job stress: no    Recent death/loss: no  Has been having some dark spots on her face and would like some retin-a.   Relevant past medical, surgical, family and social history reviewed and updated as indicated. Interim medical history since our last visit reviewed. Allergies and medications reviewed and updated.  Review of Systems  Constitutional: Negative.   HENT: Negative.    Respiratory:  Positive for cough. Negative for apnea, choking, chest tightness, shortness of breath, wheezing and stridor.   Cardiovascular: Negative.   Gastrointestinal: Negative.   Musculoskeletal: Negative.   Psychiatric/Behavioral: Negative.      Per HPI unless specifically indicated above     Objective:    There were no vitals taken for this visit.  Wt Readings from Last 3 Encounters:  08/27/23 194 lb 12.8 oz (88.4 kg)  08/05/23 193 lb (87.5  kg)  07/15/23 193 lb 12.8 oz (87.9 kg)    Physical Exam Vitals and nursing note reviewed.  Constitutional:      General: She is not in acute distress.    Appearance: Normal appearance. She is not ill-appearing, toxic-appearing or diaphoretic.  HENT:     Head: Normocephalic and atraumatic.     Right Ear: External ear normal.     Left Ear: External ear normal.     Nose: Nose normal.     Mouth/Throat:     Mouth: Mucous membranes are moist.     Pharynx: Oropharynx is clear.  Eyes:     General: No scleral icterus.       Right eye: No discharge.        Left eye: No discharge.     Conjunctiva/sclera: Conjunctivae normal.     Pupils: Pupils are equal, round, and reactive to light.  Pulmonary:     Effort: Pulmonary effort is normal. No respiratory distress.     Comments: Speaking in full  sentences Musculoskeletal:        General: Normal range of motion.     Cervical back: Normal range of motion.  Skin:    Coloration: Skin is not jaundiced or pale.     Findings: No bruising, erythema, lesion or rash.     Comments: Facial dark spots  Neurological:     Mental Status: She is alert and oriented to person, place, and time. Mental status is at baseline.  Psychiatric:        Mood and Affect: Mood normal.        Behavior: Behavior normal.        Thought Content: Thought content normal.        Judgment: Judgment normal.     Results for orders placed or performed in visit on 08/27/23  WET PREP FOR TRICH, YEAST, CLUE   Collection Time: 08/27/23  9:20 AM   Specimen: Urine   Urine  Result Value Ref Range   Trichomonas Exam Negative Negative   Yeast Exam Negative Negative   Clue Cell Exam Positive (A) Negative  Microscopic Examination   Collection Time: 08/27/23  9:20 AM   Urine  Result Value Ref Range   WBC, UA None seen 0 - 5 /hpf   RBC, Urine 0-2 0 - 2 /hpf   Epithelial Cells (non renal) 0-10 0 - 10 /hpf   Mucus, UA Present (A) Not Estab.   Bacteria, UA None seen None seen/Few  Urinalysis, Routine w reflex microscopic   Collection Time: 08/27/23  9:20 AM  Result Value Ref Range   Specific Gravity, UA 1.020 1.005 - 1.030   pH, UA 6.0 5.0 - 7.5   Color, UA Yellow Yellow   Appearance Ur Cloudy (A) Clear   Leukocytes,UA Negative Negative   Protein,UA Negative Negative/Trace   Glucose, UA Negative Negative   Ketones, UA Negative Negative   RBC, UA Trace (A) Negative   Bilirubin, UA Negative Negative   Urobilinogen, Ur 1.0 0.2 - 1.0 mg/dL   Nitrite, UA Negative Negative   Microscopic Examination See below:   Cytology - PAP   Collection Time: 08/27/23  9:39 AM  Result Value Ref Range   High risk HPV Negative    Adequacy      Satisfactory for evaluation; transformation zone component ABSENT.   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 Will increase her wellbutrin to 300mg  XR and recheck in about 6 weeks. Call with any concerns.       Relevant Medications   buPROPion (WELLBUTRIN XL) 300 MG 24 hr tablet   Other Visit Diagnoses       Age spots       Will send in retin-a discussed driness and easing into use. Call with any concerns.        Follow up plan: Return in about 6 weeks (around 12/24/2023).   This visit was completed via video visit through MyChart due to the restrictions of the COVID-19 pandemic. All issues as above were discussed and addressed. Physical exam was done as above through visual confirmation on video through MyChart. If it was felt that the patient should be evaluated in the office, they were directed there. The patient verbally consented to this visit. Location of the patient: home Location of the provider: work Those involved with this call:  Provider: Olevia Perches, DO CMA: Malen Gauze, CMA Front Desk/Registration:  Servando Snare   Time spent on call:  25 minutes with patient face to face via video conference. More than 50% of this time was spent in counseling and coordination of care. 40 minutes total spent in review of patient's record and preparation of their chart.

## 2023-11-12 NOTE — Assessment & Plan Note (Signed)
Still not quite there. Will increase her wellbutrin to 300mg  XR and recheck in about 6 weeks. Call with any concerns.

## 2023-11-14 ENCOUNTER — Encounter: Payer: Self-pay | Admitting: Family Medicine

## 2023-11-14 ENCOUNTER — Telehealth: Payer: Self-pay

## 2023-11-14 NOTE — Telephone Encounter (Signed)
Noted

## 2023-11-14 NOTE — Telephone Encounter (Signed)
Prior authorization was initiated via CoverMyMeds for prescription of Tretinoin 0.01% Gel. Patient insurance denied the request. Patient was notified via MyChart. Please advise?  KEY: [id number]

## 2023-11-16 MED ORDER — TRETINOIN 0.01 % EX GEL: CUTANEOUS | 0 refills | Status: DC

## 2023-11-17 MED ORDER — TRETINOIN 0.01 % EX GEL: CUTANEOUS | 0 refills | Status: DC

## 2023-11-17 NOTE — Addendum Note (Signed)
Addended by: Dorcas Carrow on: 11/17/2023 10:05 AM   Modules accepted: Orders

## 2023-11-30 MED ORDER — TRETINOIN 0.025 % EX CREA: TOPICAL_CREAM | Freq: Every day | CUTANEOUS | 0 refills | Status: DC

## 2023-11-30 NOTE — Addendum Note (Signed)
 Addended by: Solomon Dupre on: 11/30/2023 07:30 PM   Modules accepted: Orders

## 2023-12-08 ENCOUNTER — Encounter: Payer: Self-pay | Admitting: Family Medicine

## 2023-12-09 NOTE — Telephone Encounter (Signed)
 appt

## 2023-12-10 ENCOUNTER — Telehealth: Payer: BC Managed Care – PPO | Admitting: Family Medicine

## 2023-12-10 ENCOUNTER — Encounter: Payer: Self-pay | Admitting: Family Medicine

## 2023-12-10 DIAGNOSIS — H6691 Otitis media, unspecified, right ear: Secondary | ICD-10-CM | POA: Diagnosis not present

## 2023-12-10 MED ORDER — AMOXICILLIN-POT CLAVULANATE 875-125 MG PO TABS: 1.0000 | ORAL_TABLET | Freq: Two times a day (BID) | ORAL | 0 refills | Status: DC

## 2023-12-10 NOTE — Progress Notes (Signed)
There were no vitals taken for this visit.   Subjective:    Patient ID: Sherri Kelly, female    DOB: 1976/08/02, 47 y.o.   MRN: [id number]  HPI: Sherri Kelly is a 48 y.o. female  Chief Complaint  Patient presents with   Pain    Jaw and ear pain with draining. Started a couple weeks ago, pain x 4 days ago.    Has been having discharge down the back of her throat. Has been having pain in her R ear and pressure and loss of hearing. Has also had URI symptoms.   EAR PAIN Duration: a couple of weeks Involved ear(s): R ear Severity:  moderate  Quality:  sharp with sneezing, aching, pressure Fever: none, but can't get warm Otorrhea: no Upper respiratory infection symptoms: yes Pruritus: yes Hearing loss: yes Water immersion no Using Q-tips: no Recurrent otitis media: no Status: worse Treatments attempted: tylenol, ibuprofen, sudafed  Relevant past medical, surgical, family and social history reviewed and updated as indicated. Interim medical history since our last visit reviewed. Allergies and medications reviewed and updated.  Review of Systems  Constitutional:  Positive for fatigue. Negative for activity change, appetite change, chills, diaphoresis, fever and unexpected weight change.  HENT:  Positive for congestion, ear pain, hearing loss and rhinorrhea. Negative for dental problem, drooling, ear discharge, facial swelling, mouth sores, nosebleeds, postnasal drip, sinus pressure, sinus pain, sneezing, sore throat, tinnitus, trouble swallowing and voice change.   Eyes: Negative.   Respiratory: Negative.    Cardiovascular: Negative.   Gastrointestinal: Negative.   Psychiatric/Behavioral: Negative.      Per HPI unless specifically indicated above     Objective:    There were no vitals taken for this visit.  Wt Readings from Last 3 Encounters:  08/27/23 194 lb 12.8 oz (88.4 kg)  08/05/23 193 lb (87.5 kg)  07/15/23 193 lb 12.8 oz (87.9 kg)    Physical Exam Vitals  and nursing note reviewed.  Constitutional:      General: She is not in acute distress.    Appearance: Normal appearance. She is not ill-appearing, toxic-appearing or diaphoretic.  HENT:     Head: Normocephalic and atraumatic.     Right Ear: External ear normal.     Left Ear: External ear normal.     Nose: Nose normal.     Mouth/Throat:     Mouth: Mucous membranes are moist.     Pharynx: Oropharynx is clear.  Eyes:     General: No scleral icterus.       Right eye: No discharge.        Left eye: No discharge.     Conjunctiva/sclera: Conjunctivae normal.     Pupils: Pupils are equal, round, and reactive to light.  Pulmonary:     Effort: Pulmonary effort is normal. No respiratory distress.     Comments: Speaking in full sentences Musculoskeletal:        General: Normal range of motion.     Cervical back: Normal range of motion.  Skin:    Coloration: Skin is not jaundiced or pale.     Findings: No bruising, erythema, lesion or rash.  Neurological:     Mental Status: She is alert and oriented to person, place, and time. Mental status is at baseline.  Psychiatric:        Mood and Affect: Mood normal.        Behavior: Behavior normal.        Thought Content:  Thought content normal.        Judgment: Judgment normal.     Results for orders placed or performed in visit on 08/27/23  WET PREP FOR TRICH, YEAST, CLUE   Collection Time: 08/27/23  9:20 AM   Specimen: Urine   Urine  Result Value Ref Range   Trichomonas Exam Negative Negative   Yeast Exam Negative Negative   Clue Cell Exam Positive (A) Negative  Microscopic Examination   Collection Time: 08/27/23  9:20 AM   Urine  Result Value Ref Range   WBC, UA None seen 0 - 5 /hpf   RBC, Urine 0-2 0 - 2 /hpf   Epithelial Cells (non renal) 0-10 0 - 10 /hpf   Mucus, UA Present (A) Not Estab.   Bacteria, UA None seen None seen/Few  Urinalysis, Routine w reflex microscopic   Collection Time: 08/27/23  9:20 AM  Result Value Ref  Range   Specific Gravity, UA 1.020 1.005 - 1.030   pH, UA 6.0 5.0 - 7.5   Color, UA Yellow Yellow   Appearance Ur Cloudy (A) Clear   Leukocytes,UA Negative Negative   Protein,UA Negative Negative/Trace   Glucose, UA Negative Negative   Ketones, UA Negative Negative   RBC, UA Trace (A) Negative   Bilirubin, UA Negative Negative   Urobilinogen, Ur 1.0 0.2 - 1.0 mg/dL   Nitrite, UA Negative Negative   Microscopic Examination See below:   Cytology - PAP   Collection Time: 08/27/23  9:39 AM  Result Value Ref Range   High risk HPV Negative    Adequacy      Satisfactory for evaluation; transformation zone component ABSENT.   Diagnosis      - Negative for intraepithelial lesion or malignancy (NILM)   Comment Normal Reference Range HPV - Negative       Assessment & Plan:   Problem List Items Addressed This Visit   None Visit Diagnoses       Right otitis media, unspecified otitis media type    -  Primary   Will treat empirically as we can't see into her ear. Call if not getting better or getting worse. Has appt scheduled for 3/5- will check ear at that time.   Relevant Medications   amoxicillin-clavulanate (AUGMENTIN) 875-125 MG tablet        Follow up plan: Return for As scheduled.    This visit was completed via video visit through MyChart due to the restrictions of the COVID-19 pandemic. All issues as above were discussed and addressed. Physical exam was done as above through visual confirmation on video through MyChart. If it was felt that the patient should be evaluated in the office, they were directed there. The patient verbally consented to this visit. Location of the patient: home Location of the provider: home Those involved with this call:  Provider: Olevia Perches, DO CMA: Arnetha Gula, CMA Front Desk/Registration:  Servando Snare   Time spent on call:  15 minutes with patient face to face via video conference. More than 50% of this time was spent in counseling and  coordination of care. 23 minutes total spent in review of patient's record and preparation of their chart.

## 2023-12-10 NOTE — Telephone Encounter (Signed)
 Mychart video appt scheduled.

## 2023-12-22 ENCOUNTER — Other Ambulatory Visit: Payer: Self-pay | Admitting: Family Medicine

## 2023-12-23 NOTE — Telephone Encounter (Signed)
 Lipid panel in date.  Requested Prescriptions  Pending Prescriptions Disp Refills   rosuvastatin (CRESTOR) 5 MG tablet [Pharmacy Med Name: Rosuvastatin Calcium 5 MG Oral Tablet] 90 tablet 0    Sig: Take 1 tablet by mouth once daily     Cardiovascular:  Antilipid - Statins 2 Failed - 12/23/2023  1:09 PM      Failed - Lipid Panel in normal range within the last 12 months    Cholesterol, Total  Date Value Ref Range Status  07/02/2023 223 (H) 100 - 199 mg/dL Final   LDL Chol Calc (NIH)  Date Value Ref Range Status  07/02/2023 145 (H) 0 - 99 mg/dL Final   HDL  Date Value Ref Range Status  07/02/2023 61 >39 mg/dL Final   Triglycerides  Date Value Ref Range Status  07/02/2023 96 0 - 149 mg/dL Final         Passed - Cr in normal range and within 360 days    Creatinine, Ser  Date Value Ref Range Status  07/02/2023 0.87 0.57 - 1.00 mg/dL Final         Passed - Patient is not pregnant      Passed - Valid encounter within last 12 months    Recent Outpatient Visits           1 month ago Moderate recurrent major depression (HCC)   [place] [facility] [place], Megan P, DO   2 months ago Moderate recurrent major depression (HCC)   [place] [facility] [place], Megan P, DO   3 months ago Routine general medical examination at a health care facility   [facility] [place] P, DO   4 months ago Moderate recurrent major depression [facility])   [place] [facility] [place], Megan P, DO   5 months ago Moderate recurrent major depression [facility])   [place] [facility] Reader, Oralia Rud, DO       Future Appointments             Tomorrow Dorcas Carrow, DO [place] [facility]

## 2023-12-24 ENCOUNTER — Ambulatory Visit: Payer: BC Managed Care – PPO | Admitting: Family Medicine

## 2023-12-24 ENCOUNTER — Encounter: Payer: Self-pay | Admitting: Family Medicine

## 2023-12-24 ENCOUNTER — Telehealth (INDEPENDENT_AMBULATORY_CARE_PROVIDER_SITE_OTHER): Payer: BC Managed Care – PPO | Admitting: Family Medicine

## 2023-12-24 DIAGNOSIS — R5382 Chronic fatigue, unspecified: Secondary | ICD-10-CM

## 2023-12-24 DIAGNOSIS — F331 Major depressive disorder, recurrent, moderate: Secondary | ICD-10-CM

## 2023-12-24 MED ORDER — ARIPIPRAZOLE 5 MG PO TABS: 5.0000 mg | ORAL_TABLET | Freq: Every day | ORAL | 3 refills | Status: DC

## 2023-12-24 NOTE — Progress Notes (Signed)
 There were no vitals taken for this visit.   Subjective:    Patient ID: Sherri Kelly, female    DOB: [birth year], 48 y.o.   MRN: [id number]  HPI: Sherri Kelly is a 48 y.o. female  Chief Complaint  Patient presents with   Depression   DEPRESSION- has been feeling a lot worse in the past 2 weeks. She notes that she is having more anxiety and no motivation. Her cousin died 2 weeks ago, and she has been doing worse Mood status: exacerbated Satisfied with current treatment?: no Symptom severity: severe  Duration of current treatment : chronic Side effects: yes- jittery on the wellbutrin Medication compliance: excellent compliance Psychotherapy/counseling: no  Previous psychiatric medications: wellbutrin, lexapro Depressed mood: yes Anxious mood: yes Anhedonia: yes Significant weight loss or gain: no Insomnia: yes hard to fall asleep Fatigue: yes Feelings of worthlessness or guilt: yes Impaired concentration/indecisiveness: no Suicidal ideations: no Hopelessness: yes Crying spells: yes    12/24/2023    9:56 AM 12/10/2023   11:18 AM 11/12/2023   10:13 AM 10/08/2023    8:48 AM 08/27/2023    8:54 AM  Depression screen PHQ 2/9  Decreased Interest 3 2 1 2 2   Down, Depressed, Hopeless 2 2 1 1 2   PHQ - 2 Score 5 4 2 3 4   Altered sleeping 3 3 3 2 2   Tired, decreased energy 3 3 3 3 3   Change in appetite 2 2 1 1 1   Feeling bad or failure about yourself  3 3 1 1 1   Trouble concentrating 3 3 3 3 2   Moving slowly or fidgety/restless 2 2 2 2 1   Suicidal thoughts 0 0 0 0 0  PHQ-9 Score 21 20 15 15 14   Difficult doing work/chores Very difficult Somewhat difficult Somewhat difficult Somewhat difficult Somewhat difficult      12/24/2023    9:57 AM 12/10/2023   11:19 AM 11/12/2023   10:15 AM 10/08/2023    8:50 AM  GAD 7 : Generalized Anxiety Score  Nervous, Anxious, on Edge 3 3 2 3   Control/stop worrying 3 2 2 3   Worry too much - different things 3 3 2 3   Trouble relaxing 3 2 1 3    Restless 2 2 3 3   Easily annoyed or irritable 1 2 1 2   Afraid - awful might happen 3 3 1 3   Total GAD 7 Score 18 17 12 20   Anxiety Difficulty Somewhat difficult Somewhat difficult Somewhat difficult Somewhat difficult    Relevant past medical, surgical, family and social history reviewed and updated as indicated. Interim medical history since our last visit reviewed. Allergies and medications reviewed and updated.  Review of Systems  Constitutional: Negative.   Respiratory: Negative.    Cardiovascular: Negative.   Musculoskeletal: Negative.   Psychiatric/Behavioral:  Positive for dysphoric mood and sleep disturbance. Negative for hallucinations, self-injury and suicidal ideas. The patient is nervous/anxious. The patient is not hyperactive.     Per HPI unless specifically indicated above     Objective:    There were no vitals taken for this visit.  Wt Readings from Last 3 Encounters:  08/27/23 194 lb 12.8 oz (88.4 kg)  08/05/23 193 lb (87.5 kg)  07/15/23 193 lb 12.8 oz (87.9 kg)    Physical Exam Vitals and nursing note reviewed.  Constitutional:      General: She is not in acute distress.    Appearance: Normal appearance. She is not ill-appearing, toxic-appearing or diaphoretic.  HENT:  Head: Normocephalic and atraumatic.     Right Ear: External ear normal.     Left Ear: External ear normal.     Nose: Nose normal.     Mouth/Throat:     Mouth: Mucous membranes are moist.     Pharynx: Oropharynx is clear.  Eyes:     General: No scleral icterus.       Right eye: No discharge.        Left eye: No discharge.     Conjunctiva/sclera: Conjunctivae normal.     Pupils: Pupils are equal, round, and reactive to light.  Pulmonary:     Effort: Pulmonary effort is normal. No respiratory distress.     Comments: Speaking in full sentences Musculoskeletal:        General: Normal range of motion.     Cervical back: Normal range of motion.  Skin:    Coloration: Skin is not  jaundiced or pale.     Findings: No bruising, erythema, lesion or rash.  Neurological:     Mental Status: She is alert and oriented to person, place, and time. Mental status is at baseline.  Psychiatric:        Mood and Affect: Mood normal.        Behavior: Behavior normal.        Thought Content: Thought content normal.        Judgment: Judgment normal.     Results for orders placed or performed in visit on 08/27/23  WET PREP FOR TRICH, YEAST, CLUE   Collection Time: 08/27/23  9:20 AM   Specimen: Urine   Urine  Result Value Ref Range   Trichomonas Exam Negative Negative   Yeast Exam Negative Negative   Clue Cell Exam Positive (A) Negative  Microscopic Examination   Collection Time: 08/27/23  9:20 AM   Urine  Result Value Ref Range   WBC, UA None seen 0 - 5 /hpf   RBC, Urine 0-2 0 - 2 /hpf   Epithelial Cells (non renal) 0-10 0 - 10 /hpf   Mucus, UA Present (A) Not Estab.   Bacteria, UA None seen None seen/Few  Urinalysis, Routine w reflex microscopic   Collection Time: 08/27/23  9:20 AM  Result Value Ref Range   Specific Gravity, UA 1.020 1.005 - 1.030   pH, UA 6.0 5.0 - 7.5   Color, UA Yellow Yellow   Appearance Ur Cloudy (A) Clear   Leukocytes,UA Negative Negative   Protein,UA Negative Negative/Trace   Glucose, UA Negative Negative   Ketones, UA Negative Negative   RBC, UA Trace (A) Negative   Bilirubin, UA Negative Negative   Urobilinogen, Ur 1.0 0.2 - 1.0 mg/dL   Nitrite, UA Negative Negative   Microscopic Examination See below:   Cytology - PAP   Collection Time: 08/27/23  9:39 AM  Result Value Ref Range   High risk HPV Negative    Adequacy      Satisfactory for evaluation; transformation zone component ABSENT.   Diagnosis      - Negative for intraepithelial lesion or malignancy (NILM)   Comment Normal Reference Range HPV - Negative       Assessment & Plan:   Problem List Items Addressed This Visit       Other   Moderate recurrent major depression  (HCC) - Primary   Will cut down on her wellbutrin and start abilify. Recheck 2 weeks. Will check labs. Call with any concerns. Continue to monitor.  Relevant Medications   buPROPion (WELLBUTRIN SR) 150 MG 12 hr tablet   Other Visit Diagnoses       Chronic fatigue       Will check labs. Await results.   Relevant Orders   CBC with Differential/Platelet   Comprehensive metabolic panel   TSH   VITAMIN D 25 Hydroxy (Vit-D Deficiency, Fractures)        Follow up plan: Return in about 2 weeks (around 01/07/2024).   This visit was completed via video visit through MyChart due to the restrictions of the COVID-19 pandemic. All issues as above were discussed and addressed. Physical exam was done as above through visual confirmation on video through MyChart. If it was felt that the patient should be evaluated in the office, they were directed there. The patient verbally consented to this visit. Location of the patient: home Location of the provider: home Those involved with this call:  Provider: Olevia Perches, DO CMA: Arnetha Gula, CMA Front Desk/Registration:  Servando Snare   Time spent on call:  15 minutes with patient face to face via video conference. More than 50% of this time was spent in counseling and coordination of care. 23 minutes total spent in review of patient's record and preparation of their chart.

## 2023-12-24 NOTE — Assessment & Plan Note (Signed)
 Will cut down on her wellbutrin and start abilify. Recheck 2 weeks. Will check labs. Call with any concerns. Continue to monitor.

## 2023-12-26 ENCOUNTER — Other Ambulatory Visit: Payer: Self-pay | Admitting: Family Medicine

## 2023-12-26 MED ORDER — TRETINOIN 0.025 % EX CREA: TOPICAL_CREAM | Freq: Every day | CUTANEOUS | 3 refills | Status: DC

## 2023-12-29 NOTE — Telephone Encounter (Signed)
 Refilled 12/26/23. Requested Prescriptions  Refused Prescriptions Disp Refills   RETIN-A 0.025 % cream [Pharmacy Med Name: RETIN-A 0.025% CREAM[!]] 45 g 0    Sig: APPLY TOPICALLY TO THE AFFECTED AREA(S) AT BEDTIME     Dermatology:  Acne preparations Passed - 12/29/2023  8:07 AM      Passed - Valid encounter within last 12 months    Recent Outpatient Visits           1 month ago Moderate recurrent major depression (HCC)   [place] [facility] [place], Megan P, DO   2 months ago Moderate recurrent major depression [facility])   [place] [facility] [place], Megan P, DO   4 months ago Routine general medical examination at a health care facility   [facility] [place] P, DO   4 months ago Moderate recurrent major depression [facility])   [place] [facility] [place] P, DO   6 months ago Moderate recurrent major depression [facility])   [place] [facility] [place], Megan P, DO

## 2023-12-31 ENCOUNTER — Encounter: Payer: Self-pay | Admitting: Family Medicine

## 2023-12-31 ENCOUNTER — Other Ambulatory Visit

## 2024-01-01 ENCOUNTER — Encounter: Payer: Self-pay | Admitting: Family Medicine

## 2024-01-02 ENCOUNTER — Other Ambulatory Visit

## 2024-01-02 DIAGNOSIS — R5382 Chronic fatigue, unspecified: Secondary | ICD-10-CM | POA: Diagnosis not present

## 2024-01-02 NOTE — Telephone Encounter (Signed)
 Pt. Sherri Kelly can and schedule an appt

## 2024-01-02 NOTE — Telephone Encounter (Signed)
 In person appt. I can see her today if she can get here.

## 2024-01-02 NOTE — Telephone Encounter (Signed)
 Called pat to schedule an appt left detailed message that provider will see her today as an overbook

## 2024-01-07 ENCOUNTER — Encounter: Payer: Self-pay | Admitting: Family Medicine

## 2024-01-12 MED ORDER — ARIPIPRAZOLE 5 MG PO TABS: 5.0000 mg | ORAL_TABLET | Freq: Every day | ORAL | 3 refills | Status: DC

## 2024-01-15 ENCOUNTER — Encounter: Payer: Self-pay | Admitting: Family Medicine

## 2024-02-06 ENCOUNTER — Encounter: Payer: Self-pay | Admitting: Family Medicine

## 2024-02-06 ENCOUNTER — Telehealth: Admitting: Family Medicine

## 2024-02-06 DIAGNOSIS — F331 Major depressive disorder, recurrent, moderate: Secondary | ICD-10-CM | POA: Diagnosis not present

## 2024-02-06 DIAGNOSIS — G4733 Obstructive sleep apnea (adult) (pediatric): Secondary | ICD-10-CM

## 2024-02-06 DIAGNOSIS — F411 Generalized anxiety disorder: Secondary | ICD-10-CM

## 2024-02-06 MED ORDER — TRAZODONE HCL 50 MG PO TABS: 50.0000 mg | ORAL_TABLET | Freq: Every day | ORAL | 1 refills | Status: DC

## 2024-02-06 MED ORDER — BUPROPION HCL ER (SR) 200 MG PO TB12: 200.0000 mg | ORAL_TABLET | Freq: Two times a day (BID) | ORAL | 2 refills | Status: DC

## 2024-02-06 NOTE — Progress Notes (Signed)
 There were no vitals taken for this visit.   Subjective:    Patient ID: Sherri Kelly, female    DOB: Nov 21, 1975, 48 y.o.   MRN: [id number]  HPI: Sherri Kelly is a 48 y.o. female  Chief Complaint  Patient presents with   Anxiety    Not doing well right now.    ANXIETY/DEPRESSION- feels like her sleep is doing worse. She feels like the depression has been doing a lot worse. She feels like her motivation has not been there.  Duration: chronic Status:exacerbated Anxious mood: no  Excessive worrying: no Irritability: no  Sweating: no Nausea: no Palpitations:no Hyperventilation: no Panic attacks: no Agoraphobia: no  Obscessions/compulsions: no Depressed mood: yes    02/06/2024    1:54 PM 12/24/2023    9:56 AM 12/10/2023   11:18 AM 11/12/2023   10:13 AM 10/08/2023    8:48 AM  Depression screen PHQ 2/9  Decreased Interest 3 3 2 1 2   Down, Depressed, Hopeless 3 2 2 1 1   PHQ - 2 Score 6 5 4 2 3   Altered sleeping 3 3 3 3 2   Tired, decreased energy 3 3 3 3 3   Change in appetite 2 2 2 1 1   Feeling bad or failure about yourself  3 3 3 1 1   Trouble concentrating 3 3 3 3 3   Moving slowly or fidgety/restless 1 2 2 2 2   Suicidal thoughts 0 0 0 0 0  PHQ-9 Score 21 21 20 15 15   Difficult doing work/chores Extremely dIfficult Very difficult Somewhat difficult Somewhat difficult Somewhat difficult   Anhedonia: no Weight changes: no Insomnia: yes hard to stay asleep  Hypersomnia: yes Fatigue/loss of energy: yes Feelings of worthlessness: no Feelings of guilt: no Impaired concentration/indecisiveness: no Suicidal ideations: no  Crying spells: no Recent Stressors/Life Changes: no   Relationship problems: no   Family stress: no     Financial stress: no    Job stress: no    Recent death/loss: no  SLEEP APNEA Sleep apnea status: uncontrolled Duration: chronic Satisfied with current treatment?:  no CPAP use:  no Sleep quality with CPAP use: poor Treament compliance:poor  compliance Last sleep study: last few years Treatments attempted: CPAP Wakes feeling refreshed:  no Daytime hypersomnolence:  yes Fatigue:  yes Insomnia:  yes Good sleep hygiene:  yes Difficulty falling asleep:  yes Difficulty staying asleep:  yes Snoring bothers bed partner:  yes Observed apnea by bed partner: yes Obesity:  yes Hypertension: no  Pulmonary hypertension:  no Coronary artery disease:  no    12/02/2022    2:32 PM  Results of the Epworth flowsheet  Sitting and reading 1  Watching TV 1  Sitting, inactive in a public place (e.g. a theatre or a meeting) 0  As a passenger in a car for an hour without a break 3  Lying down to rest in the afternoon when circumstances permit 3  Sitting and talking to someone 0  Sitting quietly after a lunch without alcohol 1  In a car, while stopped for a few minutes in traffic 0  Total score 9    Relevant past medical, surgical, family and social history reviewed and updated as indicated. Interim medical history since our last visit reviewed. Allergies and medications reviewed and updated.  Review of Systems  Constitutional:  Positive for fatigue. Negative for activity change, appetite change, chills, diaphoresis, fever and unexpected weight change.  Respiratory: Negative.    Cardiovascular: Negative.   Gastrointestinal:  Negative.   Psychiatric/Behavioral:  Positive for dysphoric mood and sleep disturbance. Negative for agitation, behavioral problems, confusion, decreased concentration, hallucinations, self-injury and suicidal ideas. The patient is nervous/anxious. The patient is not hyperactive.     Per HPI unless specifically indicated above     Objective:    There were no vitals taken for this visit.  Wt Readings from Last 3 Encounters:  08/27/23 194 lb 12.8 oz (88.4 kg)  08/05/23 193 lb (87.5 kg)  07/15/23 193 lb 12.8 oz (87.9 kg)    Physical Exam Vitals and nursing note reviewed.  Constitutional:      General: She is  not in acute distress.    Appearance: Normal appearance. She is not ill-appearing, toxic-appearing or diaphoretic.  HENT:     Head: Normocephalic and atraumatic.     Right Ear: External ear normal.     Left Ear: External ear normal.     Nose: Nose normal.     Mouth/Throat:     Mouth: Mucous membranes are moist.     Pharynx: Oropharynx is clear.  Eyes:     General: No scleral icterus.       Right eye: No discharge.        Left eye: No discharge.     Conjunctiva/sclera: Conjunctivae normal.     Pupils: Pupils are equal, round, and reactive to light.  Pulmonary:     Effort: Pulmonary effort is normal. No respiratory distress.     Comments: Speaking in full sentences Musculoskeletal:        General: Normal range of motion.     Cervical back: Normal range of motion.  Skin:    Coloration: Skin is not jaundiced or pale.     Findings: No bruising, erythema, lesion or rash.  Neurological:     Mental Status: She is alert and oriented to person, place, and time. Mental status is at baseline.  Psychiatric:        Mood and Affect: Mood normal.        Behavior: Behavior normal.        Thought Content: Thought content normal.        Judgment: Judgment normal.     Results for orders placed or performed in visit on 08/27/23  WET PREP FOR TRICH, YEAST, CLUE   Collection Time: 08/27/23  9:20 AM   Specimen: Urine   Urine  Result Value Ref Range   Trichomonas Exam Negative Negative   Yeast Exam Negative Negative   Clue Cell Exam Positive (A) Negative  Microscopic Examination   Collection Time: 08/27/23  9:20 AM   Urine  Result Value Ref Range   WBC, UA None seen 0 - 5 /hpf   RBC, Urine 0-2 0 - 2 /hpf   Epithelial Cells (non renal) 0-10 0 - 10 /hpf   Mucus, UA Present (A) Not Estab.   Bacteria, UA None seen None seen/Few  Urinalysis, Routine w reflex microscopic   Collection Time: 08/27/23  9:20 AM  Result Value Ref Range   Specific Gravity, UA 1.020 1.005 - 1.030   pH, UA 6.0 5.0  - 7.5   Color, UA Yellow Yellow   Appearance Ur Cloudy (A) Clear   Leukocytes,UA Negative Negative   Protein,UA Negative Negative/Trace   Glucose, UA Negative Negative   Ketones, UA Negative Negative   RBC, UA Trace (A) Negative   Bilirubin, UA Negative Negative   Urobilinogen, Ur 1.0 0.2 - 1.0 mg/dL   Nitrite, UA Negative Negative  Microscopic Examination See below:   Cytology - PAP   Collection Time: 08/27/23  9:39 AM  Result Value Ref Range   High risk HPV Negative    Adequacy      Satisfactory for evaluation; transformation zone component ABSENT.   Diagnosis      - Negative for intraepithelial lesion or malignancy (NILM)   Comment Normal Reference Range HPV - Negative       Assessment & Plan:   Problem List Items Addressed This Visit       Respiratory   OSA (obstructive sleep apnea) - Primary   Unable to tolerate CPAP. Last sleep study done before she established care here. Will get her into sleep medicine for evaluation for other treatments.       Relevant Orders   Ambulatory referral to Sleep Studies     Other   Generalized anxiety disorder   Not doing well. Will increase her wellbutrin  to 200mg  and recheck in 2 weeks. Call with any concerns.       Relevant Medications   buPROPion  (WELLBUTRIN  SR) 200 MG 12 hr tablet   traZODone  (DESYREL ) 50 MG tablet   Moderate recurrent major depression (HCC)   Not doing well. Will increase her wellbutrin  to 200mg  and recheck in 2 weeks. Call with any concerns.       Relevant Medications   buPROPion  (WELLBUTRIN  SR) 200 MG 12 hr tablet   traZODone  (DESYREL ) 50 MG tablet     Follow up plan: Return in about 2 weeks (around 02/20/2024) for virtual OK.   This visit was completed via video visit through MyChart due to the restrictions of the COVID-19 pandemic. All issues as above were discussed and addressed. Physical exam was done as above through visual confirmation on video through MyChart. If it was felt that the patient  should be evaluated in the office, they were directed there. The patient verbally consented to this visit. Location of the patient: work Location of the provider: work Those involved with this call:  Provider: Duwaine Louder, DO CMA: Izetta Sarah, CMA Front Desk/Registration: Claretta Maiden  Time spent on call:  25 minutes with patient face to face via video conference. More than 50% of this time was spent in counseling and coordination of care. 40 minutes total spent in review of patient's record and preparation of their chart.

## 2024-02-06 NOTE — Assessment & Plan Note (Signed)
 Not doing well. Will increase her wellbutrin  to 200mg  and recheck in 2 weeks. Call with any concerns.

## 2024-02-06 NOTE — Assessment & Plan Note (Signed)
 Unable to tolerate CPAP. Last sleep study done before she established care here. Will get her into sleep medicine for evaluation for other treatments.

## 2024-02-09 NOTE — Progress Notes (Signed)
 Appt has been made. Left message on Machine for pat to call if date and time does not work for her.

## 2024-02-18 ENCOUNTER — Encounter: Payer: Self-pay | Admitting: Family Medicine

## 2024-02-18 ENCOUNTER — Telehealth: Admitting: Family Medicine

## 2024-02-18 VITALS — Ht 68.0 in | Wt 190.0 lb

## 2024-02-18 DIAGNOSIS — F331 Major depressive disorder, recurrent, moderate: Secondary | ICD-10-CM | POA: Diagnosis not present

## 2024-02-18 MED ORDER — TRETINOIN 0.05 % EX CREA: TOPICAL_CREAM | Freq: Every day | CUTANEOUS | 1 refills | Status: DC

## 2024-02-18 NOTE — Assessment & Plan Note (Signed)
 Feeling better, but not sure it's going to be enough. Will give her another few weeks on her wellbutrin  dose and recheck. Call with any concerns.

## 2024-02-18 NOTE — Progress Notes (Signed)
 Ht 5\' 8"  (1.727 m)   Wt 190 lb (86.2 kg)   BMI 28.89 kg/m    Subjective:    Patient ID: Sherri Kelly, female    DOB: 1975-11-13, 48 y.o.   MRN: [id number]  HPI: Sherri Kelly is a 48 y.o. female  Chief Complaint  Patient presents with   Anxiety   Depression   ANXIETY/DEPRESSION Duration: chronic Status:uncontrolled Anxious mood: yes  Excessive worrying: no Irritability: no  Sweating: no Nausea: no Palpitations:no Hyperventilation: no Panic attacks: no Agoraphobia: no  Obscessions/compulsions: no Depressed mood: no    02/18/2024    8:02 AM 02/06/2024    1:54 PM 12/24/2023    9:56 AM 12/10/2023   11:18 AM 11/12/2023   10:13 AM  Depression screen PHQ 2/9  Decreased Interest 3 3 3 2 1   Down, Depressed, Hopeless 1 3 2 2 1   PHQ - 2 Score 4 6 5 4 2   Altered sleeping 3 3 3 3 3   Tired, decreased energy 3 3 3 3 3   Change in appetite 1 2 2 2 1   Feeling bad or failure about yourself  2 3 3 3 1   Trouble concentrating 3 3 3 3 3   Moving slowly or fidgety/restless 1 1 2 2 2   Suicidal thoughts 0 0 0 0 0  PHQ-9 Score 17 21 21 20 15   Difficult doing work/chores Very difficult Extremely dIfficult Very difficult Somewhat difficult Somewhat difficult      02/18/2024    8:03 AM 02/06/2024    1:56 PM 12/24/2023    9:57 AM 12/10/2023   11:19 AM  GAD 7 : Generalized Anxiety Score  Nervous, Anxious, on Edge 3 2 3 3   Control/stop worrying 3 3 3 2   Worry too much - different things 3 2 3 3   Trouble relaxing 3 2 3 2   Restless 2 1 2 2   Easily annoyed or irritable 1 1 1 2   Afraid - awful might happen 3 3 3 3   Total GAD 7 Score 18 14 18 17   Anxiety Difficulty Somewhat difficult Somewhat difficult Somewhat difficult Somewhat difficult   Anhedonia: no Weight changes: no Insomnia: no   Hypersomnia: no Fatigue/loss of energy: no Feelings of worthlessness: no Feelings of guilt: no Impaired concentration/indecisiveness: no Suicidal ideations: no  Crying spells: no Recent  Stressors/Life Changes: no   Relationship problems: no   Family stress: no     Financial stress: no    Job stress: no    Recent death/loss: no  Relevant past medical, surgical, family and social history reviewed and updated as indicated. Interim medical history since our last visit reviewed. Allergies and medications reviewed and updated.  Review of Systems  Constitutional: Negative.   Respiratory: Negative.    Cardiovascular: Negative.   Musculoskeletal: Negative.   Skin: Negative.   Neurological: Negative.   Psychiatric/Behavioral:  Positive for dysphoric mood and sleep disturbance. Negative for agitation, behavioral problems, confusion, decreased concentration, hallucinations, self-injury and suicidal ideas. The patient is nervous/anxious. The patient is not hyperactive.     Per HPI unless specifically indicated above     Objective:    Ht 5\' 8"  (1.727 m)   Wt 190 lb (86.2 kg)   BMI 28.89 kg/m   Wt Readings from Last 3 Encounters:  02/18/24 190 lb (86.2 kg)  08/27/23 194 lb 12.8 oz (88.4 kg)  08/05/23 193 lb (87.5 kg)    Physical Exam Vitals and nursing note reviewed.  Constitutional:  General: She is not in acute distress.    Appearance: Normal appearance. She is not ill-appearing, toxic-appearing or diaphoretic.  HENT:     Head: Normocephalic and atraumatic.     Right Ear: External ear normal.     Left Ear: External ear normal.     Nose: Nose normal.     Mouth/Throat:     Mouth: Mucous membranes are moist.     Pharynx: Oropharynx is clear.  Eyes:     General: No scleral icterus.       Right eye: No discharge.        Left eye: No discharge.     Conjunctiva/sclera: Conjunctivae normal.     Pupils: Pupils are equal, round, and reactive to light.  Pulmonary:     Effort: Pulmonary effort is normal. No respiratory distress.     Comments: Speaking in full sentences Musculoskeletal:        General: Normal range of motion.     Cervical back: Normal range of  motion.  Skin:    Coloration: Skin is not jaundiced or pale.     Findings: No bruising, erythema, lesion or rash.  Neurological:     Mental Status: She is alert and oriented to person, place, and time. Mental status is at baseline.  Psychiatric:        Mood and Affect: Mood normal.        Behavior: Behavior normal.        Thought Content: Thought content normal.        Judgment: Judgment normal.     Results for orders placed or performed in visit on 08/27/23  WET PREP FOR TRICH, YEAST, CLUE   Collection Time: 08/27/23  9:20 AM   Specimen: Urine   Urine  Result Value Ref Range   Trichomonas Exam Negative Negative   Yeast Exam Negative Negative   Clue Cell Exam Positive (A) Negative  Microscopic Examination   Collection Time: 08/27/23  9:20 AM   Urine  Result Value Ref Range   WBC, UA None seen 0 - 5 /hpf   RBC, Urine 0-2 0 - 2 /hpf   Epithelial Cells (non renal) 0-10 0 - 10 /hpf   Mucus, UA Present (A) Not Estab.   Bacteria, UA None seen None seen/Few  Urinalysis, Routine w reflex microscopic   Collection Time: 08/27/23  9:20 AM  Result Value Ref Range   Specific Gravity, UA 1.020 1.005 - 1.030   pH, UA 6.0 5.0 - 7.5   Color, UA Yellow Yellow   Appearance Ur Cloudy (A) Clear   Leukocytes,UA Negative Negative   Protein,UA Negative Negative/Trace   Glucose, UA Negative Negative   Ketones, UA Negative Negative   RBC, UA Trace (A) Negative   Bilirubin, UA Negative Negative   Urobilinogen, Ur 1.0 0.2 - 1.0 mg/dL   Nitrite, UA Negative Negative   Microscopic Examination See below:   Cytology - PAP   Collection Time: 08/27/23  9:39 AM  Result Value Ref Range   High risk HPV Negative    Adequacy      Satisfactory for evaluation; transformation zone component ABSENT.   Diagnosis      - Negative for intraepithelial lesion or malignancy (NILM)   Comment Normal Reference Range HPV - Negative       Assessment & Plan:   Problem List Items Addressed This Visit        Other   Moderate recurrent major depression (HCC) - Primary   Feeling  better, but not sure it's going to be enough. Will give her another few weeks on her wellbutrin  dose and recheck. Call with any concerns.         Follow up plan: Return in about 3 weeks (around 03/10/2024).    This visit was completed via video visit through MyChart due to the restrictions of the COVID-19 pandemic. All issues as above were discussed and addressed. Physical exam was done as above through visual confirmation on video through MyChart. If it was felt that the patient should be evaluated in the office, they were directed there. The patient verbally consented to this visit. Location of the patient: work Location of the provider: work Those involved with this call:  Provider: Terre Ferri, DO CMA: Linda Repress, CMA, Front Desk/Registration: Jaynee Meyer  Time spent on call:  15 minutes with patient face to face via video conference. More than 50% of this time was spent in counseling and coordination of care. 23 minutes total spent in review of patient's record and preparation of their chart.

## 2024-02-19 ENCOUNTER — Ambulatory Visit: Admitting: Family Medicine

## 2024-02-19 NOTE — Progress Notes (Signed)
 Appointment has been made and mailed

## 2024-03-02 ENCOUNTER — Ambulatory Visit: Admitting: Family Medicine

## 2024-03-05 ENCOUNTER — Encounter: Payer: Self-pay | Admitting: Family Medicine

## 2024-03-12 ENCOUNTER — Other Ambulatory Visit: Payer: Self-pay | Admitting: Family

## 2024-03-12 DIAGNOSIS — E559 Vitamin D deficiency, unspecified: Secondary | ICD-10-CM

## 2024-03-18 ENCOUNTER — Encounter: Payer: Self-pay | Admitting: Family Medicine

## 2024-03-19 ENCOUNTER — Encounter: Payer: Self-pay | Admitting: Family Medicine

## 2024-03-19 ENCOUNTER — Telehealth: Admitting: Family Medicine

## 2024-03-19 VITALS — Ht 68.0 in | Wt 190.0 lb

## 2024-03-19 DIAGNOSIS — F331 Major depressive disorder, recurrent, moderate: Secondary | ICD-10-CM

## 2024-03-19 MED ORDER — BUPROPION HCL ER (SR) 200 MG PO TB12: 200.0000 mg | ORAL_TABLET | Freq: Two times a day (BID) | ORAL | 1 refills | Status: DC

## 2024-03-19 MED ORDER — CARIPRAZINE HCL 1.5 MG PO CAPS: 1.5000 mg | ORAL_CAPSULE | Freq: Every day | ORAL | 2 refills | Status: DC

## 2024-03-19 NOTE — Progress Notes (Signed)
 Ht 5\' 8"  (1.727 m)   Wt 190 lb (86.2 kg)   BMI 28.89 kg/m    Subjective:    Patient ID: Sherri Kelly, female    DOB: 26-Oct-1975, 48 y.o.   MRN: [id number]  HPI: Sherri Kelly is a 48 y.o. female  Chief Complaint  Patient presents with   Depression    Pt states that she feels the Wellbutrin  is ineffective. Worsening depression.   DEPRESSION- notes that she has been feeling very tired and not wanting to get out of the bed.  Duration: chronic Mood status: exacerbated Satisfied with current treatment?: no Symptom severity: moderate to severe  Duration of current treatment : months Side effects: yes Medication compliance: excellent compliance Psychotherapy/counseling: no  Previous psychiatric medications: abilify , lexapro , wellbutrin , pristiq ,  Depressed mood: yes Anxious mood: yes Anhedonia: yes Significant weight loss or gain: yes Insomnia: yes hard to fall asleep Fatigue: yes Feelings of worthlessness or guilt: yes Impaired concentration/indecisiveness: yes Suicidal ideations: no Hopelessness: yes Crying spells: yes    03/19/2024   10:43 AM 02/18/2024    8:02 AM 02/06/2024    1:54 PM 12/24/2023    9:56 AM 12/10/2023   11:18 AM  Depression screen PHQ 2/9  Decreased Interest 3 3 3 3 2   Down, Depressed, Hopeless 3 1 3 2 2   PHQ - 2 Score 6 4 6 5 4   Altered sleeping 3 3 3 3 3   Tired, decreased energy 3 3 3 3 3   Change in appetite 0 1 2 2 2   Feeling bad or failure about yourself  2 2 3 3 3   Trouble concentrating 3 3 3 3 3   Moving slowly or fidgety/restless 1 1 1 2 2   Suicidal thoughts 0 0 0 0 0  PHQ-9 Score 18 17 21 21 20   Difficult doing work/chores Extremely dIfficult Very difficult Extremely dIfficult Very difficult Somewhat difficult    Relevant past medical, surgical, family and social history reviewed and updated as indicated. Interim medical history since our last visit reviewed. Allergies and medications reviewed and updated.  Review of Systems   Constitutional: Negative.   HENT: Negative.    Respiratory: Negative.    Cardiovascular: Negative.   Endocrine: Positive for cold intolerance. Negative for heat intolerance, polydipsia, polyphagia and polyuria.    Per HPI unless specifically indicated above     Objective:     Ht 5\' 8"  (1.727 m)   Wt 190 lb (86.2 kg)   BMI 28.89 kg/m   Wt Readings from Last 3 Encounters:  03/19/24 190 lb (86.2 kg)  02/18/24 190 lb (86.2 kg)  08/27/23 194 lb 12.8 oz (88.4 kg)    Physical Exam Vitals and nursing note reviewed.  Constitutional:      General: She is not in acute distress.    Appearance: Normal appearance. She is not ill-appearing, toxic-appearing or diaphoretic.  HENT:     Head: Normocephalic and atraumatic.     Right Ear: External ear normal.     Left Ear: External ear normal.     Nose: Nose normal.     Mouth/Throat:     Mouth: Mucous membranes are moist.     Pharynx: Oropharynx is clear.  Eyes:     General: No scleral icterus.       Right eye: No discharge.        Left eye: No discharge.     Conjunctiva/sclera: Conjunctivae normal.     Pupils: Pupils are equal, round, and reactive to  light.  Pulmonary:     Effort: Pulmonary effort is normal. No respiratory distress.     Comments: Speaking in full sentences Musculoskeletal:        General: Normal range of motion.     Cervical back: Normal range of motion.  Skin:    Coloration: Skin is not jaundiced or pale.     Findings: No bruising, erythema, lesion or rash.  Neurological:     Mental Status: She is alert and oriented to person, place, and time. Mental status is at baseline.  Psychiatric:        Mood and Affect: Mood normal.        Behavior: Behavior normal.        Thought Content: Thought content normal.        Judgment: Judgment normal.     Results for orders placed or performed in visit on 08/27/23  WET PREP FOR TRICH, YEAST, CLUE   Collection Time: 08/27/23  9:20 AM   Specimen: Urine   Urine  Result  Value Ref Range   Trichomonas Exam Negative Negative   Yeast Exam Negative Negative   Clue Cell Exam Positive (A) Negative  Microscopic Examination   Collection Time: 08/27/23  9:20 AM   Urine  Result Value Ref Range   WBC, UA None seen 0 - 5 /hpf   RBC, Urine 0-2 0 - 2 /hpf   Epithelial Cells (non renal) 0-10 0 - 10 /hpf   Mucus, UA Present (A) Not Estab.   Bacteria, UA None seen None seen/Few  Urinalysis, Routine w reflex microscopic   Collection Time: 08/27/23  9:20 AM  Result Value Ref Range   Specific Gravity, UA 1.020 1.005 - 1.030   pH, UA 6.0 5.0 - 7.5   Color, UA Yellow Yellow   Appearance Ur Cloudy (A) Clear   Leukocytes,UA Negative Negative   Protein,UA Negative Negative/Trace   Glucose, UA Negative Negative   Ketones, UA Negative Negative   RBC, UA Trace (A) Negative   Bilirubin, UA Negative Negative   Urobilinogen, Ur 1.0 0.2 - 1.0 mg/dL   Nitrite, UA Negative Negative   Microscopic Examination See below:   Cytology - PAP   Collection Time: 08/27/23  9:39 AM  Result Value Ref Range   High risk HPV Negative    Adequacy      Satisfactory for evaluation; transformation zone component ABSENT.   Diagnosis      - Negative for intraepithelial lesion or malignancy (NILM)   Comment Normal Reference Range HPV - Negative       Assessment & Plan:   Problem List Items Addressed This Visit       Other   Moderate recurrent major depression (HCC) - Primary   Not doing well on her abilify . Continue lexapro  and wellbutrin  (at max tolerated dose) will change abilify  to vraylar and recheck as scheduled in about 2 weeks. List of counselors given today. Will plan on checking labs next visit to look for other causes.      Relevant Medications   buPROPion  (WELLBUTRIN  SR) 200 MG 12 hr tablet     Follow up plan: Return for As scheduled.   This visit was completed via video visit through MyChart due to the restrictions of the COVID-19 pandemic. All issues as above were  discussed and addressed. Physical exam was done as above through visual confirmation on video through MyChart. If it was felt that the patient should be evaluated in the office, they were directed  there. The patient verbally consented to this visit. Location of the patient: work Location of the provider: work Those involved with this call:  Provider: Terre Ferri, DO CMA: Linda Repress, CMA, Front Desk/Registration: Jaynee Meyer  Time spent on call: 15 minutes with patient face to face via video conference. More than 50% of this time was spent in counseling and coordination of care. 23 minutes total spent in review of patient's record and preparation of their chart.

## 2024-03-19 NOTE — Assessment & Plan Note (Signed)
 Not doing well on her abilify . Continue lexapro  and wellbutrin  (at max tolerated dose) will change abilify  to vraylar and recheck as scheduled in about 2 weeks. List of counselors given today. Will plan on checking labs next visit to look for other causes.

## 2024-03-19 NOTE — Patient Instructions (Addendum)
 [email or web address] [email or web address]  [facility] in [address] Photo of C. Bonitta Mcknight-Brown, Clinical Social Work/Therapist, MSW, LCSWA C. Bonitta Mcknight-Brown Clinical Social Work/Therapist, MSW, LCSWA 1 Endorsed Online Only One of the reasons I became a therapist is because I wanted to teach others how to advocate for their mental health needs and provide them tools they could use when facing a difficult situation or an emotion they do not understand. I am a helper and my role as your therapist will be to assist you in dealing with life's challenges, gain control over your emotions, and improve your overall mental health. I am genuine person who takes pride in my work and meeting the needs of my clients. I provide a unique person-centered approach to my clients during each session and believe that laughter is good for the soul. [phone] Email View Photo of Sande Cromer, Drug & Alcohol Counselor, Charmain Coons, LCAS, [facility], NCC, CCS [employer] Drug & Alcohol Counselor, Charmain Coons, LCAS, LCMHC, NCC, CCS 3 Endorsed Online Only I accept [place] and [employer] through [employer] Do you feel powerless, anxious, or depressed? Do you have trouble identifying goals, staying motivated, or feeling connected with friends or family? Do you use coping mechanisms that aren't always healthy to feel better? Do you want to change but don't know how or where to begin? You have options and choices; my job is to help you rediscover your potential and connect to your agency so you can make informed choices for how you want to live your life. You don't have to settle; you can break old patterns or habits and heal past traumas and hurts. We have lots of opportunities to make choices in life; are you ready to choose you? [phone] [facility], [facility], [facility] Licensed Professional Counselor, [facility] [place], [place]  [address] Does life feel heavy right now? Are you feeling overwhelmed or struggling with taking the slightest step forward? Stress can seem unrelenting and never ending. Trauma can seem staggering and unbearable. Whatever the challenges -great or small- if life has you feeling anxious, depressed, stressed, stuck, or shattered; and you are looking to grow, gain clarity and clear footing, or just to maintain your overall mental well-being, [facility] is a great place to start your journey. [phone] Email View Photo of Rachelle [place] - [facility], [facility], Clinical Social Work/Therapist, MSW, LCSW [facility] [employer], [facility] Clinical Social Work/Therapist, MSW, LCSW 1 Endorsed [place], [place] [address] Waitlist for new clients I am Licensed Clinical Social Worker practicing in the therapy community for the past 10+ years. I create a safe, comfortable environment to share and explore the thoughts and areas of your life you want to change. My goal is to help you identify the challenges in your life and guide you through the necessary steps with appropriate therapeutic modalities to improve your situation, give you a better quality of life, and foster hope. I am a dedicated therapist and social worker who treats each individual client with care, respect, and very specific treatment approaches in order to suit your particular needs. [phone] Email View Photo of Lovey Rudd, Licensed Clinical Mental Health Counselor, MEd, [facility], NCC Interior and spatial designer Licensed Clinical Mental Health Counselor, [place], [facility], NCC 1 Endorsed Online Only I am in-network for [employer], but I can also provide a superbill for my out-of-network clients to submit to your insurance for potential reimbursement. Do you find yourself paralyzed by anxiety and overwhelm, no matter what you do? You're someone  who is successful, but still find yourself feeling as if you never do enough. You keep prioritizing making  everyone else happy, leaving little time to take care of yourself. Compassion for others comes easy for you but you can't seem to find that same compassion for yourself and your inner critic takes over any time you make a mistake. But you're ready to make a change. You're ready to make yourself a priority. Most of all, you are ready to feel better about yourself and have more balance in your life. I want to help you get there. [phone] Email View Photo of Loria Rong, [facility] Clinical Mental Health Counselor Associate, [facility] Loria Rong [facility] Clinical Mental Health Counselor Associate, [facility] [place], [place] [address] Coping with difficulties like anxiety, depression, and sleep issues can be overwhelming. In alignment with your values and goals, we will work together to develop a shared understanding of your difficulties. Cognitive Behavioral Therapy (CBT) is an evidence-based approach that research has shown to be an effective treatment for various difficulties, such as anxiety and depression. It helps people identify and change the thoughts and behaviors contributing to their problems. CBT is my preferred approach to therapy, but other evidence-based approaches may be used depending on the client's needs. [phone] Email View Photo of Dr. Edris Gowers, [facility] Clinical Mental Health Counselor Supervisor, PhD, [facility] Dr. Gregery Leander [facility] Health Counselor Supervisor, PhD, [facility] [address] [place], [place] [address] Are you facing challenges that feel overwhelming? Whether you're dealing with depression, anxiety, relationship issues, or career concerns, I'm here to listen and work with you toward finding solutions. From navigating conflicts to managing grief or trauma, I offer personalized support to help you regain balance and control in your life. I provide both in-office counseling in downtown [place], [place], and telehealth sessions across [place] , making it easier to access help from  wherever you are. [phone] Email View Photo of Chervonne Thompson, Clinical Social Work/Therapist, LCSW Chervonne Thompson Clinical Social Work/Therapist, LCSW Online Only Hi! I am a Licensed Visual merchandiser (LCSW) in [place]  with over 13 years of experience in mental health. I specialize in depression, ADHD/ADD, anxiety, grief and loss, family relationship dynamics, and adjustment disorders. [phone] Email View Photo of Chervonne [place] [facility] , Clinical Social Work/Therapist, MSW, LCSW [facility] Clinical Social Work/Therapist, MSW, LCSW 1 Endorsed Online Only Whether you are working to heal from grief, illness, anxiety, depression, or to transition from one phase of life to the next, I am here to aid you in becoming the best version of yourself and living a happier, more fulfilling life. I provide services to adults, transition aged youth and families by utilizing solution-focused and Cognitive Behavioral Therapy, My hope is to assist clients in developing greater self-awareness and utilizing their inner strengths. Clients are seen in a safe and non-judgmental environment to promote growth and change. [phone] Email View Photo of Charleen Conn, Clinical Social Work/Therapist, LCSW, LCAS, CCS Charleen Conn Clinical Social Work/Therapist, Tedi Fearing, CCS [place], [place] [address] Every person I serve has a unique story. While some clients experience challenges in just one area, most experience difficulty multiple areas (mental health, trauma, loss, addiction), creating a complex, vicious cycle if not all parts are addressed. The circumstances that bring someone to therapy do not come in 'neat little packages.' I enjoy sorting out the pieces with each client, getting to the root of the issue, and offering a combination  of evidence-based therapeutic interventions that help clients finally begin to experience relief, and start  living a happier, more fulfilled life. [phone] Email View Photo of Verneita Goldmann, Clinical Social Work/Therapist, LCSW [facility] Clinical Social Work/Therapist, LCSW [place], [place] [address] With a background in social work and clinical psychology, I have been providing mental health services for more than 20 years. I am trained in Interpersonal Therapy, Cognitive Behavioral Therapy (CBT) and various Trauma Specific practices and am experienced in providing therapy to individuals, couples and families. While I am able to see clients presenting with various concerns, my main areas of focus include anxiety, depression, chronic stress, trauma and dealing with life adjustments. [phone] Email View [place] StegerChristina Steger  Wyaconda Steger [place] BlackledgeCourtney Blackledge  Courtney Blackledge Lauren BarkerLauren Janne Members [facility] [place]  Mariah Shines [place] Amber DobyAmber Doby  Amber Weingarten [facility]  [facility], [place] Photo of Meg [employer], Clinical Social Work/Therapist, LCSW Meg Wells Hatley Clinical Social Work/Therapist, LCSW Online Only Hello there! I've been a Visual merchandiser (LCSW) for 15 years, with experience treating adults who are struggling to achieve their goals because of anxiety, depression, or difficulty relating to and communicating with others. If you're experiencing self-critical thoughts or fears that prevent you from moving forward, struggling to get motivated to do those tasks that you need or want to do, or can't seem to build or maintain connections, I'm here to help! I strive to offer a space that is validating and supportive, where you can learn the skills to challenge these barriers and take steps towards living a meaningful life. [phone] Email View Photo of Quintisha Armenia, Clinical Social  Work/Therapist, LCSW Quintisha Armenia Clinical Social Work/Therapist, LCSW [place], [place] [address] CHRISTIAN COUNSELOR (Two different locations) We know how to ask the right questions! We know how to stretch! Stretching can feel uncomfortable but it conditions you to possess your victory to your expected end, promotes a new higher perspective, builds up development and hope, and brings a sense of wholeness. If you utilize your spiritual background or faith as one of your primary coping mechanism or lens towards your concerns in life, we are a great fit for you! Therapy style is direct, empathetic, and affirmative. [phone] Email View Photo of Gabriela Johns - [facility], [facility], Licensed Clinical Mental Health Counselor, [facility], BCPCC [facility], [facility] Licensed Clinical Mental Health Counselor, [facility], [facility] [place], [place] [address] BCBS and Raina Bunting claims will be filed for you. Do you need help thriving to function at your best or improve your relationships? Are peace and joy missing in this season of life? I help driven women relieve the suffering that comes from living life busy, but unfulfilled. Suffering that feels like anxiety, overwhelm, depression, emptiness, relationship conflict and loneliness, grief, spiritual struggles or physical pain. Learn how to reconnect with what's most important to you, feel alive, and accept yourself and others. Embrace the good offered from transitions you're in or forge new possibilities for a meaningful, joyful and internally peaceful life. [phone] Email View Hope's White, [facility] Clinical Social Work/Therapist, MSW, LCSW [place], [place] [address] Life can be challenging and remaining on a unproductive path can make us  "stuck". I believe in the power of hope as a central factor in providing the motivation necessary for change that can propel individuals and families towards the future of their dreams. Shared hope for a  positive future is the underpinning of  all that I do. I am devoted to helping individuals and families fulfill their hopes for the future. It is known that positive expectations promote psychological and physical well-being. I focus on developing hope-inspiring strategies that mobilize internal and external resources for sustaining healthy change. [phone] Email View Photo of Giovanni Lakes, Clinical Social Work/Therapist, MSW, LCSW [facility] Clinical Social Work/Therapist, MSW, LCSW 2 Endorsed [place], [place] [address] All of us  confront situations in life that can be overwhelming. Counseling can help us  to find solutions and ways to cope and succeed. I enjoy working with children, adolescents and adults facing a variety of issues. [phone] Email View Photo of Bonitta McKnight-Brown - [employer], Clinical Social Work/Therapist, MSW, Marketing executive [employer] Clinical Social Work/Therapist, MSW, LCSWA Online Only One of the reasons I became a therapist is because I wanted to teach others how to advocate for their mental health needs and provide them tools they could use when facing a difficult situation or an emotion they do not understand. I am a helper and my role as your therapist will be to assist you in dealing with life's challenges, gain control over your emotions, and improve your overall mental health. I am genuine person who takes pride in my work and meeting the needs of my clients. I provide a unique person-centered approach to my clients during each session and believe that laughter is good for the soul. [phone] Email View Photo of Niki Barter, Licensed Clinical Mental Health Counselor, [facility], LCAS Niki Barter Licensed Clinical Mental Health Counselor, [facility], LCAS 4 Endorsed Online Only Life is hard. Intense emotions, life transitions, difficulty with relationships, mental health concerns, trauma, rejection and loneliness; are only a few of the things we can  struggle with at any given time. But you don't have to deal with these issues alone. Counseling can serve as a place for you to process past hurts and life changes, identify unhelpful relationship patterns, find new coping skills and resources for strong emotions or mental health issues, or provide yourself with the ultimate self-care by setting an hour aside to reflect on where you are, where you want to go, and what obstacles are in the way of your goals. [phone] Email View Photo of Abelino Able, Clinical Social Work/Therapist, LCSW Abelino Able Clinical Social Work/Therapist, LCSW Online Only I have been a Visual merchandiser for over twelve years and have worked in a variety of settings. I have worked with individuals diagnosed with Depression, Anxiety, Substance Use and Aspergers Disease. I have also worked with individuals with psychosis. My desire is to listen to my clients to gain a better understanding of issues that brought them to therapy. This will allow me to better understand the goals that they have set for themselves and identify strategies to move forward. My belief is that each individual has their own strengths but sometimes support and reflection are needed to reflect and build upon those strengths [phone] Email View Photo of Debbe Fail, Licensed Clinical Mental Health Counselor, [facility], [place] Lauren [facility] Licensed Clinical Mental Health Counselor, [facility], [place] 5 Endorsed [place], [place] [address] If you are not in network with BCBS or Raina Bunting, I provide monthly superbills for you to submit to your insurance provider. Accepting new clients for June 2025! You often don't feel seen or valued by people in your life. You're the one who supports others when they're going through a hard time but find it hard  to ask for help for yourself-maybe because you don't feel worthy or don't want to be a burden. However, dealing with anxiety, depression, body image concerns, an eating  disorder, or past trauma can be isolating & feel overwhelming to manage alone. Or if you're a therapist yourself, you may need the extra support of being in therapy! If any of this resonates with you, I'd love to help support you on the journey towards the life you crave. [phone] Email  Photo of Elfrieda Grise, Counselor, [place], [facility], NCC [address] Counselor, [place], [facility], NCC 4 Endorsed [place], [place] [address] Is your life feeling difficult right now and you need support in navigating the chaos? Do you need help resolving past traumas, talking kinder to yourself, or letting go of toxic relationships? Are daily tasks feeling impossible, and you just want to give up? Is your marriage feeling strained or irreparable? Maybe you consider yourself a people pleaser and have difficulties expressing your needs, or maybe you're going through a brutal break-up. There are times in all of our lives where we could benefit from extra support. I get it - being human is messy, but we don't have to do it alone. I'd love to support you through your journey. [phone] Email View Photo of Dianah Fort, Counselor, [place], [facility], NCC Dianah Fort Counselor, [place], [facility], NCC 2 Endorsed Serves Area There's a quote by Dr. Magdalena Scholz that says, "The kids who need the most love will ask for it in the most unloving ways." Because I'm a mother of 5 that has 11 years of experience with adoption issues, I know this to be true. Developmental trauma has a huge impact on children, whether it is a result of inconsistent early caregiving (adoption/foster/difficult home life), a difficult pregnancy or delivery, early hospitalization, abuse, or neglect. These are the kids that desperately need to feel loved and secure, but will act out in many ways to test their caregivers' resolve. [phone] [address] Photo of [place], [facility], LCAS, CCS, MAC Melissa [place] Verified 1 Endorsed Serves Area [place],  [facility], QS, LCAS, CCS, MAC is Owner/CEO/Director of [employer] , a private substance abuse and mental health facility located in [place] and [place], [place] where she uses her twenty-eight years of experience to treat individuals with co-occurring disorder. Melissa is also the founder of [employer] Heritage manager, a Social research officer, government focused on [employer] and community awareness. [phone] Email View Photo of Arleta Bench, Licensed Clinical Mental Health Counselor, [place], [facility], NCC Cassandra Otero Licensed Clinical Mental Health Counselor, [place], [facility], NCC 3 Endorsed Serves Area You want to make changes in your world, but you feel overwhelmed. Feelings of stress and anxiety leave you unsure of where to even start. You would like to talk to someone, but you are afraid of being misunderstood and judged. The loneliness from not being able to open up about what is happening just creates more helplessness, confusion, and pain. [phone] Email View Photo of Resa Cass Hambright, Clinical Social Work/Therapist, MSW, LCSW Resa Cass Hambright Clinical Social Work/Therapist, MSW, LCSW 3 Endorsed Online Only Everyone has needs, if we didn't we would not be human. Your individuality is what makes you who you are. Are you lost, unsure, anxious, nervous, sad, down, unmotivated, pissed off, angry, having your emotions going every which way, racing thoughts, nightmares, flashbacks, ect. What is your goal and how are you going to reach it? Let's work together from [employer] to your  Life. [phone] Email View Photo of Brew[place], Licensed Pharmacist, hospital, [place], NCC, [facility] [employer], [place], NCC, [facility] 1 Endorsed Serves Area [employer] for new clients I accept self pay and am in network with the following insurances: Community education officer, [employer], [place] and [employer]. Welcome. You are safe here. Feeling  overwhelmed, alone, misunderstood, or scared in the face of life's challenges? Struggling to find happiness? I'm here to provide a compassionate space with genuine empathy, embracing a non-judgmental approach. Regardless of your background or where you are on life's path, I'm committed to walking alongside you. Whether you or your child are battling stress, anxiety, depression or a haunting past, we'll journey together. We'll explore your unique path, share tools, gain insights, and set meaningful goals to transform your life towards finding the happiest and healthiest version of you. [phone] Email View Photo of Colie Dawes, Licensed Clinical Mental Health Counselor, [place], [facility] Colie Dawes Licensed Clinical Mental Health Counselor, [place], [facility] 3 Endorsed Serves Area Waitlist for new clients Welcome ! I am a Licensed Production manager, Licensed Clinical biochemist and co-owner of [facility]. Before establishing [facility], my background included work in school-based counseling and Artist. My professional journey has allowed me the privilege of seeing firsthand the impact and healing that can occur when individuals are given a safe and welcoming place to heal. I have a compassionate care approach and believe in the power of understanding, empathy, faith and connection to help individuals reclaim their lives from mental health challenges. [phone] Email View Photo of [place], Research scientist (physical sciences), [place], [facility] [employer], [place], [facility] Serves Area I accept most BCBS and [employer] as well as self-pay! Reaching out for support takes courage--and whether you're navigating anxiety, depression, a big life transition, or the complex journey of pregnancy and postpartum, I'm here to walk alongside you with compassion, curiosity, and care. My approach is grounded in evidence-based  tools like Cognitive Behavioral Therapy (CBT) to help you identify and shift unhelpful patterns, while also honoring your inner wisdom through a person-centered lens that values your unique experience. I often weave in mindfulness practices to help you slow down, reconnect with yourself, and find clarity in the present moment. [phone] Email View Photo of Azucena Bollard - [facility], Licensed Clinical Mental Health Counselor, [place], [facility], ACS, CCATP, NCC [facility] Licensed Clinical Mental Health Counselor, [place], LCMHCS, ACS, CCATP, NCC 4 Endorsed Serves Area On the outside, it appears you have a great life, but deep down, something is missing. You may feel anxious, guilty, overwhelmed, resentful, exhausted, or empty. You often do not think you're "good enough." Despite your achievements, you expected life to be more fulfilling. You're not living the life you dreamed of and need help figuring out where to begin. You can make a change starting today. You deserve to live a fulfilled and authentic life. We help people like you discover self-care strategies, slow down and reconnect with themselves, [phone] [address] Photo of Tovon Easton, Counselor, NCC, [facility], LCASA Tovon [place]ango Counselor, NCC, [facility], [employer] Serves Area A renewed mind can change your life! Deciding to seek therapy can be life changing. Counseling should be an experience that allows you to think of your life and circumstances in a way that promotes positive changes. Choosing me as your counselor, I will be your partner dedicated to providing unconditional positive  regard, and empathetic listening, to guide you on your journey of lasting change for a brighter, and happier future. [phone] Email View Photo of [employer], Clinical Social Work/Therapist, MSW, LCSW Amber Delphi Clinical Social Work/Therapist, MSW, LCSW 1 Endorsed Serves Area Are  you experiencing feelings of anxiety, low mood, or would like someone to process your feelings with? Reaching out and asking for help can be difficult. I want to make that first step easier for you by providing nonjudgmental listening and support in a safe environment. Therapy should be an avenue for you to explore your strengths and learn more about yourself. You are the expert in your own life and I will collaborate with you to seek tools and strategies to cope. I believe in empowering those I work with to express their goals for treatment and explore how I can best support that individual as their therapist. [phone] Email View [place] StegerChristina Steger  Moran Steger Vincent BlackledgeCourtney Blackledge  Courtney Blackledge Lauren BarkerLauren Janne Members [facility] [place] [facility] [place]  Mariah Shines [place] Amber DobyAmber [address] [place] [facility]  [facility], [facility] Photo of Katherine K. Bannon, Clinical Social Work/Therapist, MSW, LCSWA Katherine K. Bannon Clinical Social Work/Therapist, MSW, LCSWA 2 Endorsed Serves Area I am in-network with [employer] and [employer]. Welcome to [facility]. I am a compassionate and dedicated psychotherapist who specializes in trauma-informed therapy. My focus includes helping individuals experiencing PTSD, depression, anxiety, substance use disorder, relationship issues, and other mental health challenges. As a Probation officer, I will guide you in a safe and encouraging space to explore past trauma and work through issues that have been holding you back from living life on your terms and as your true, authentic self. [phone] Email View Photo of Alena An, Clinical Social Work/Therapist, MSW, LCSW Tremaine Riley Clinical Social Work/Therapist, MSW, LCSW Online Only Need  help coping with life changes? Struggling with depression, anxiety, or feelings of being overwhelmed? If you are looking to embrace your past experiences and reach your full potential, I am the therapist for you. I am passionate about my work in Print production planner. My relationship with therapy has had a profoundly positive influence on my life, and the goal in my practice is to instill those same experiences in you. During our time together, you will address issues that affect your mental well-being and learn to replace unwanted behavior with new activities. [phone] Email View Photo of Karen [place], Research scientist (physical sciences), [employer], MS Karen [place] Licensed Professional Counselor, [facility], MS Serves Area Stress, anxiety and other difficult emotions may be a normal part of life, but they don't have to be in the driver's seat on life's journey. Through my 25+ years of experience as a Veterinary surgeon and my personal experience of having to reclaim the driver's seat from debilitating anxiety/depression, I have learned the truth behind Nilda Barthel Proust's claim that "The voyage of discovery is not in seeking new landscapes but in having new eyes". It is my goal to help people gain new "eyes" through which to view current or past experiences. [phone] Email View Photo of Natasha Bailiff, Clinical Social Work/Therapist, LCSW Natasha Bailiff Clinical Social Work/Therapist, LCSW Serves Area My clients say that I am comfortable to talk to and easy to get to know. I can assess a situation and problem solve in an efficient and timely manner, helping you to see  quick results in areas of your life that you are open to change. I have 20 plus years of experience yet am very current in my treatment approach. I look forward to meeting you, and joining with you on your self care journey. [phone] Email View Photo of Quirino Buckles, Clinical Social Work/Therapist, LCSW Quirino Buckles Clinical Social  Work/Therapist, LCSW 1 Endorsed Serves Area Part of being human means facing uncertain, stressful and frightening situations. Most of the time we can tolerate and cope with these. Other times our problems become unmanageable. Situations, problems, relationships, work, school and other life experiences produce thoughts, feelings and unhelpful behaviors that become so overwhelming we begin to believe that we will "never" find a way to get through it. If your feelings and thoughts seem too big for you to manage at this time then I would be honored to join you and assist you to find new strategies and information that will help you cope more effectively now and in the future. [phone] Email View Photo of Sherian Dimitri - Sherian Dimitri - [facility], Counselor, MDiv, MA, LCMHC, LCASA, NCC Sherian Dimitri - [facility] Counselor, MDiv, MA, LCMHC, LCASA, NCC 4 Endorsed Serves Area I entered the counseling profession to support individuals who are having difficulties facing life's challenges. I have found that many people struggle because they have created unrealistic expectations about the support, relationships, and encouragement they need. I believe that as humanity becomes "busier," life becomes more complicated, affecting relationships and how we see ourselves fitting in the world. Life is often full of difficult and complicated experiences that are difficult to overcome, especially without support. Ultimately, these difficulties distract us  from pursuing what enriches our lives. [phone] Email View Photo of Starr, Counselor, [place], [facility], NCC Ralls Counselor, [place], [facility], [facility] Serves Area I believe in the power of individual healing and meeting my clients where they are through creativity, reflection, humor, and out of the box thinking. Life can be difficult, but we are all equipped with the power to change and heal from the brutality of  being a human in a flawed world. I strive to help you become the best version of yourself using an individual, patient, kind, but direct counseling style. I aim to help individuals build stronger relationships with themselves so they can build stronger communities and connections with others. I [phone] Email View Photo of Johnny Nanas, Clinical Social Work/Therapist, MSW, LCSW [name] Dumain Clinical Social Work/Therapist, MSW, [employer] Area I believe therapy should be a collaborative effort between client and therapist. While I may share some evidenced-based ideas during our sessions, only you are the expert on what works for you. My sessions will generally follow a cognitive behavioral approach, meaning that we will examine, and possibly shift, your thoughts and perspectives on life situations that are causing you enough distress to want to pursue change. Together we will form a plan of action that is the right fit for you at this point in time. [phone] Email View Photo of Amber Doby, Clinical Social Work/Therapist, MSW, Audiological scientist Doby Clinical Social Work/Therapist, MSW, LCSWA 1 Endorsed Serves Area Waitlist for new clients Could your child or teen be happier? Are they struggling with self-worth, school achievement, peer relationships, depression, anxiety, or ADHD? Has the amount of time they use technology such as video gaming or social media become problematic? Life can be challenging, and most of us  need help at one time or another. My passion  is helping children and their families overcome barriers by empowering them with the skills and strategies to be the best they can be. I offer a safe place to process thoughts and feelings and be heard and understood. [phone] Email View  Photo of Sallyann Crea - [facility] , Counselor, [facility], LCAS-A, CSOTS [facility] Counselor, [facility], LCAS-A, CSOTS Online Only I try to assist each  individual to see their issue from another perspective. The effort that is put into resolving issues of interpersonal conflict should match the resolution you seek to obtain. [phone] Email View Photo of Pierrette Brick, Psychological Associate, PhD Pierrette Brick Psychological Associate, PhD Serves Area Do you feel mentally or emotionally depleted? Are your experiences with trauma impacting your relationships? Are you tired of making decisions that don't serve who you want to be? Individual therapy explores how all parts of your life work together and, with various treatment strategies, can support you in managing distress. In our time together, we will explore experiences, thoughts, and core beliefs that may prevent you from being where you truly want to be. Additionally, you gain tools and skills to equip you for the journey ahead. [phone] Email View Photo of Lory Rough, Counselor, [facility] Ingle Reid Counselor, [facility] 1 Endorsed Online Only I know that life can be really challenging at times. I also know that it's not always easy to navigate it either. As one journeys through life, there comes a time when we all need the love and support of someone who cares. For this reason, it has always been my passion to help those who are in need of that love and support. With a non-judgmental attitude and a heart of compassion I aim to meet you where you are. My hope is to walk alongside you in an effort to help you achieve your therapeutic goals. [phone] Email View Photo of Vashti Gentles, Clinical Social Work/Therapist, MSW, LCSW Rolanda Clever [place] Clinical Social Work/Therapist, MSW, LCSW 2 Endorsed Serves Area I have been a Consulting civil engineer and therapist for over 22 years and have had the honor of working with people of different ages and backgrounds. I work with people of all orientations and gender identities. I am passionate about working with people affected  by anxiety, depression, trauma, abuse and neglect, violence, ADHD, and parenting and relationship issues. If you are exploring this site, it is likely you are struggling with something significant in your life. Taking this initial step to explore therapy and gain insight and awareness into yourself is a brave and courageous act! [phone] Email View Photo of Jaymes Mew - [facility], [facility], Licensed Programmer, multimedia, PhD, [facility], [facility] Licensed Clinical Mental Health Counselor, PhD, [facility] Serves Area Greetings! I am Dr. Veronica Gordon, a mother, wife, daughter, sister, friend, and proud military spouse and I am here for you. We all need someone to listen and make us  feel as though we are not alone. I am here to help you feel strengthened and comforted knowing that you have what it takes to conqueror any challenge that may come your way. I am a proud Hotel manager spouse and a former Programmer, systems who is committed to giving back to my community. My passion for youth, the [employer] community, and encouraging others is the driving force behind my desire to continue to advance in the mental health field and provide the best service possible. [phone] Email View Photo of Krista Peters, Licensed Clinical Mental Health Counselor, MA, [facility] Krista Peters Licensed Clinical Mental Health Counselor, [place], [facility] 2 Endorsed Serves Area We all strive to find our way in a difficult world. Yet many people find themselves feeling alone and misunderstood. I specialize in helping people with depression, anxiety, substance abuse, and legal issues. I work primarily with adults and adolescents and am a Merchandiser, retail. [phone] Email View Photo of Ednalyn Adelia [place], Research scientist (physical sciences), MS, NCC, [facility], LCMHCS Ednalyn [employer] Licensed Professional Counselor, MS, NCC, [facility],  [facility] Online Only The individuals I work with are psychologically healthy and can learn to think and decide for themselves. Due to stress, grief, anger ,anxiety,fear etc. they may need the aid of someone to guide them toward making positive change in their lives or setting goals for a new direction in life .I enjoy serving a diverse group of people , however specialties include working with women issues such as single parents, domestic violence, and animal assisted therapy. [phone] Email View Photo of Garnet Just, Licensed Clinical Mental Health Counselor, MEd, [facility], NCC Garnet Just Licensed Clinical Mental Health Counselor, [place], [facility], [facility] Serves Area I am a psychodynamically-oriented psychotherapist that works with individuals experiencing relational difficulties and/or disruptions in their overall sense of self. Such concerns result in emerging or worsening psychological symptoms that are painful and difficult to manage. In addressing underlying mental conflicts and their associated compromises, I work with people to get behind their symptoms with the goal of developing more insight, self-agency, and a fuller life. [phone] View Photo of Jenine Mix [place], Clinical Social Work/Therapist, LCSW Jenine Mix [place] Clinical Social Work/Therapist, LCSW Online Only I accept Cassandra, [employer], Blue Cross Blue [employer] of [place] , [employer], and private pay. Life can be unpredictable and can impact how a person copes with the stressors of everyday life. During chaotic times many clients have mentioned feeling overwhelmed, angry and anxious about their current situation. Regardless of the circumstances that led to someone reaching out for therapy, they took the first step toward wanting to change. Therapy can provide a safe and nonjudgmental space where you are validated and encouraged to express how you feel. My goal is to foster a strong therapeutic relationship and help and reach  your personal goals. [phone] Email View Photo of Lannie Pizza, Research scientist (physical sciences), [place], LPC [employer], MA, LPC [place], [place] [address] Not accepting new clients On the surface; you seem fine, but inside, you have a self-critical voice that won't let up. You have a lot of feelings that you feel deeply. This can be a strength but it can also leave you feeling confused about why others don't see what you see. Others may have told you that you are too sensitive but you also may be the person that they come to for advice or support. Maybe you have been hesitant to try counseling because you feel that you should be able to figure this out yourself. [phone] Email View Photo of Cletus Dakins, Clinical Social Work/Therapist, MSW, LCSW, LCAS Jason Wesson Clinical Social Work/Therapist, MSW, LCSW, LCAS 2 Endorsed Online Only Not accepting new clients **Not Accepting New Clients Currently** I am highly experienced with those who find themselves stuck in unproductive or damaging patterns of thinking or actions and have been unable to move forward, even with other professional help. The feedback I receive from fellow providers and clients  is that I have a high success rate of treatment and recovery. I believe that outpatient psychotherapy is a process of collaboration that provides a stable, supportive environment for achieving psychological wellness. Each of us  are unique and the best expert on the stressors in our daily life. [phone] Email View [place] StegerChristina Steger  [place] Steger [place] BlackledgeCourtney Blackledge  Courtney Blackledge Lauren BarkerLauren Janne Members [facility] [place] [facility] Mariah Shines [place]aren [place]  Karen [place] Amber DobyAmber Doby  Amber Lula [facility]  [facility], [employer] Photo of John Pharo, Clinical Social  Work/Therapist, MSW, LCSW John Pharo Clinical Social Work/Therapist, MSW, LCSW Serves Area Not accepting new clients My greatest pleasure is in seeing the people I work with grow, develop new skills, and make choices that bring more joy into their lives and into the lives of their families. I consult with parents to improve discipline and strengthen the parent-child relationship. I also specialize in working with adolescents who are trying to navigate the social and academic pressures that come with secondary school, often working with parents and their teens to foster better communication and trust. [phone] Email View

## 2024-03-29 ENCOUNTER — Encounter: Payer: Self-pay | Admitting: Family Medicine

## 2024-03-29 ENCOUNTER — Ambulatory Visit (INDEPENDENT_AMBULATORY_CARE_PROVIDER_SITE_OTHER): Admitting: Family Medicine

## 2024-03-29 VITALS — BP 111/76 | HR 66 | Temp 97.9°F | Ht 68.0 in | Wt 187.0 lb

## 2024-03-29 DIAGNOSIS — R5382 Chronic fatigue, unspecified: Secondary | ICD-10-CM | POA: Diagnosis not present

## 2024-03-29 DIAGNOSIS — E782 Mixed hyperlipidemia: Secondary | ICD-10-CM

## 2024-03-29 DIAGNOSIS — F331 Major depressive disorder, recurrent, moderate: Secondary | ICD-10-CM

## 2024-03-29 HISTORY — DX: Mixed hyperlipidemia: E78.2

## 2024-03-29 NOTE — Progress Notes (Signed)
 BP 111/76 (BP Location: Left Arm, Patient Position: Sitting)   Pulse 66   Temp 97.9 F (36.6 C) (Oral)   Ht 5\' 8"  (1.727 m)   Wt 187 lb (84.8 kg)   SpO2 96%   BMI 28.43 kg/m    Subjective:    Patient ID: Sherri Kelly, female    DOB: 10-01-1976, 48 y.o.   MRN: [id number]  HPI: Sherri Kelly is a 48 y.o. female  Chief Complaint  Patient presents with   Depression    Patient states that she is starting to have more interest in doing things not noticing to much of change.    DEPRESSION Mood status: stable Satisfied with current treatment?: unsure Symptom severity: moderate  Duration of current treatment : couple of weeks Side effects: no Medication compliance: excellent compliance Psychotherapy/counseling: no  Previous psychiatric medications: lexapro , wellbutrin , abilify , vraylar  Depressed mood: yes Anxious mood: yes Anhedonia: no Significant weight loss or gain: no Insomnia: no  Fatigue: yes Feelings of worthlessness or guilt: yes Impaired concentration/indecisiveness: no Suicidal ideations: no Hopelessness: no Crying spells: no    03/29/2024    1:30 PM 03/19/2024   10:43 AM 02/18/2024    8:02 AM 02/06/2024    1:54 PM 12/24/2023    9:56 AM  Depression screen PHQ 2/9  Decreased Interest 1 3 3 3 3   Down, Depressed, Hopeless 3 3 1 3 2   PHQ - 2 Score 4 6 4 6 5   Altered sleeping 3 3 3 3 3   Tired, decreased energy 3 3 3 3 3   Change in appetite  0 1 2 2   Feeling bad or failure about yourself  1 2 2 3 3   Trouble concentrating 1 3 3 3 3   Moving slowly or fidgety/restless 1 1 1 1 2   Suicidal thoughts 0 0 0 0 0  PHQ-9 Score 13 18 17 21 21   Difficult doing work/chores Very difficult Extremely dIfficult Very difficult Extremely dIfficult Very difficult      03/29/2024    1:31 PM 03/19/2024   10:45 AM 02/18/2024    8:03 AM 02/06/2024    1:56 PM  GAD 7 : Generalized Anxiety Score  Nervous, Anxious, on Edge 3 3 3 2   Control/stop worrying 3 2 3 3   Worry too much -  different things 3 3 3 2   Trouble relaxing 2 2 3 2   Restless 3 0 2 1  Easily annoyed or irritable 1 2 1 1   Afraid - awful might happen 3 3 3 3   Total GAD 7 Score 18 15 18 14   Anxiety Difficulty Very difficult Extremely difficult Somewhat difficult Somewhat difficult   HYPERLIPIDEMIA Hyperlipidemia status: excellent compliance Satisfied with current treatment?  yes Side effects:  no Medication compliance: excellent compliance Past cholesterol meds: crestor  Supplements: none Aspirin:  no The 10-year ASCVD risk score (Arnett DK, et al., 2019) is: 0.8%   Values used to calculate the score:     Age: 23 years     Sex: Female     Is Non-Hispanic African American: No     Diabetic: No     Tobacco smoker: No     Systolic Blood Pressure: 111 mmHg     Is BP treated: No     HDL Cholesterol: 61 mg/dL     Total Cholesterol: 223 mg/dL Chest pain:  no Coronary artery disease:  no Family history CAD:  yes   Relevant past medical, surgical, family and social history reviewed and updated as  indicated. Interim medical history since our last visit reviewed. Allergies and medications reviewed and updated.  Review of Systems  Constitutional: Negative.   HENT: Negative.    Respiratory: Negative.    Cardiovascular: Negative.   Psychiatric/Behavioral:  Positive for dysphoric mood. Negative for agitation, behavioral problems, confusion, decreased concentration, hallucinations, self-injury, sleep disturbance and suicidal ideas. The patient is nervous/anxious. The patient is not hyperactive.     Per HPI unless specifically indicated above     Objective:     BP 111/76 (BP Location: Left Arm, Patient Position: Sitting)   Pulse 66   Temp 97.9 F (36.6 C) (Oral)   Ht 5\' 8"  (1.727 m)   Wt 187 lb (84.8 kg)   SpO2 96%   BMI 28.43 kg/m   Wt Readings from Last 3 Encounters:  03/29/24 187 lb (84.8 kg)  03/19/24 190 lb (86.2 kg)  02/18/24 190 lb (86.2 kg)    Physical Exam Vitals and nursing  note reviewed.  Constitutional:      General: She is not in acute distress.    Appearance: Normal appearance. She is not ill-appearing, toxic-appearing or diaphoretic.  HENT:     Head: Normocephalic and atraumatic.     Right Ear: External ear normal.     Left Ear: External ear normal.     Nose: Nose normal.     Mouth/Throat:     Mouth: Mucous membranes are moist.     Pharynx: Oropharynx is clear.  Eyes:     General: No scleral icterus.       Right eye: No discharge.        Left eye: No discharge.     Extraocular Movements: Extraocular movements intact.     Conjunctiva/sclera: Conjunctivae normal.     Pupils: Pupils are equal, round, and reactive to light.  Cardiovascular:     Rate and Rhythm: Normal rate and regular rhythm.     Pulses: Normal pulses.     Heart sounds: Normal heart sounds. No murmur heard.    No friction rub. No gallop.  Pulmonary:     Effort: Pulmonary effort is normal. No respiratory distress.     Breath sounds: Normal breath sounds. No stridor. No wheezing, rhonchi or rales.  Chest:     Chest wall: No tenderness.  Musculoskeletal:        General: Normal range of motion.     Cervical back: Normal range of motion and neck supple.  Skin:    General: Skin is warm and dry.     Capillary Refill: Capillary refill takes less than 2 seconds.     Coloration: Skin is not jaundiced or pale.     Findings: No bruising, erythema, lesion or rash.  Neurological:     General: No focal deficit present.     Mental Status: She is alert and oriented to person, place, and time. Mental status is at baseline.  Psychiatric:        Mood and Affect: Mood normal.        Behavior: Behavior normal.        Thought Content: Thought content normal.        Judgment: Judgment normal.     Results for orders placed or performed in visit on 08/27/23  WET PREP FOR TRICH, YEAST, CLUE   Collection Time: 08/27/23  9:20 AM   Specimen: Urine   Urine  Result Value Ref Range   Trichomonas  Exam Negative Negative   Yeast Exam Negative Negative  Clue Cell Exam Positive (A) Negative  Microscopic Examination   Collection Time: 08/27/23  9:20 AM   Urine  Result Value Ref Range   WBC, UA None seen 0 - 5 /hpf   RBC, Urine 0-2 0 - 2 /hpf   Epithelial Cells (non renal) 0-10 0 - 10 /hpf   Mucus, UA Present (A) Not Estab.   Bacteria, UA None seen None seen/Few  Urinalysis, Routine w reflex microscopic   Collection Time: 08/27/23  9:20 AM  Result Value Ref Range   Specific Gravity, UA 1.020 1.005 - 1.030   pH, UA 6.0 5.0 - 7.5   Color, UA Yellow Yellow   Appearance Ur Cloudy (A) Clear   Leukocytes,UA Negative Negative   Protein,UA Negative Negative/Trace   Glucose, UA Negative Negative   Ketones, UA Negative Negative   RBC, UA Trace (A) Negative   Bilirubin, UA Negative Negative   Urobilinogen, Ur 1.0 0.2 - 1.0 mg/dL   Nitrite, UA Negative Negative   Microscopic Examination See below:   Cytology - PAP   Collection Time: 08/27/23  9:39 AM  Result Value Ref Range   High risk HPV Negative    Adequacy      Satisfactory for evaluation; transformation zone component ABSENT.   Diagnosis      - Negative for intraepithelial lesion or malignancy (NILM)   Comment Normal Reference Range HPV - Negative       Assessment & Plan:   Problem List Items Addressed This Visit       Other   Moderate recurrent major depression (HCC)   Starting to feel a bit better, but unsure. Will continue vraylar  a bit longer and recheck when she's been on it about a month. Adjust dose as needed. Checking other labs to look for organic cause. Await results.      Mixed hyperlipidemia   Tolerating her crestor  well. Rechecking labs today. Call with any concerns.       Relevant Orders   Lipid Panel w/o Chol/HDL Ratio   Other Visit Diagnoses       Chronic fatigue    -  Primary   Will check labs to look for organic cause. Call with any concerns. Await results.   Relevant Orders   Comprehensive  metabolic panel with GFR   CBC with Differential/Platelet   VITAMIN D  25 Hydroxy (Vit-D Deficiency, Fractures)   TSH   B12        Follow up plan: Return in about 2 months (around 05/29/2024).

## 2024-03-29 NOTE — Assessment & Plan Note (Signed)
 Tolerating her crestor  well. Rechecking labs today. Call with any concerns.

## 2024-03-29 NOTE — Assessment & Plan Note (Signed)
 Starting to feel a bit better, but unsure. Will continue vraylar  a bit longer and recheck when she's been on it about a month. Adjust dose as needed. Checking other labs to look for organic cause. Await results.

## 2024-03-30 ENCOUNTER — Ambulatory Visit: Payer: Self-pay | Admitting: Family Medicine

## 2024-03-30 LAB — TSH: TSH: 2.11 u[IU]/mL (ref 0.450–4.500)

## 2024-03-30 LAB — COMPREHENSIVE METABOLIC PANEL WITH GFR
ALT: 13 IU/L (ref 0–32)
AST: 18 IU/L (ref 0–40)
Albumin: 4.3 g/dL (ref 3.9–4.9)
Alkaline Phosphatase: 94 IU/L (ref 44–121)
BUN/Creatinine Ratio: 14 (ref 9–23)
BUN: 13 mg/dL (ref 6–24)
Bilirubin Total: 0.4 mg/dL (ref 0.0–1.2)
CO2: 23 mmol/L (ref 20–29)
Calcium: 9.8 mg/dL (ref 8.7–10.2)
Chloride: 102 mmol/L (ref 96–106)
Creatinine, Ser: 0.92 mg/dL (ref 0.57–1.00)
Globulin, Total: 2.8 g/dL (ref 1.5–4.5)
Glucose: 95 mg/dL (ref 70–99)
Potassium: 4 mmol/L (ref 3.5–5.2)
Sodium: 140 mmol/L (ref 134–144)
Total Protein: 7.1 g/dL (ref 6.0–8.5)
eGFR: 77 mL/min/{1.73_m2} (ref >59)

## 2024-03-30 LAB — CBC WITH DIFFERENTIAL/PLATELET
Basophils Absolute: 0 10*3/uL (ref 0.0–0.2)
Basos: 1 %
EOS (ABSOLUTE): 0.1 10*3/uL (ref 0.0–0.4)
Eos: 1 %
Hematocrit: 38.7 % (ref 34.0–46.6)
Hemoglobin: 12.6 g/dL (ref 11.1–15.9)
Immature Grans (Abs): 0 10*3/uL (ref 0.0–0.1)
Immature Granulocytes: 0 %
Lymphocytes Absolute: 1.5 10*3/uL (ref 0.7–3.1)
Lymphs: 31 %
MCH: 29.9 pg (ref 26.6–33.0)
MCHC: 32.6 g/dL (ref 31.5–35.7)
MCV: 92 fL (ref 79–97)
Monocytes Absolute: 0.3 10*3/uL (ref 0.1–0.9)
Monocytes: 7 %
Neutrophils Absolute: 2.9 10*3/uL (ref 1.4–7.0)
Neutrophils: 60 %
Platelets: 265 10*3/uL (ref 150–450)
RBC: 4.21 x10E6/uL (ref 3.77–5.28)
RDW: 11.7 % (ref 11.7–15.4)
WBC: 4.9 10*3/uL (ref 3.4–10.8)

## 2024-03-30 LAB — LIPID PANEL W/O CHOL/HDL RATIO
Cholesterol, Total: 142 mg/dL (ref 100–199)
HDL: 72 mg/dL (ref >39)
LDL Chol Calc (NIH): 53 mg/dL (ref 0–99)
Triglycerides: 92 mg/dL (ref 0–149)
VLDL Cholesterol Cal: 17 mg/dL (ref 5–40)

## 2024-03-30 LAB — VITAMIN D 25 HYDROXY (VIT D DEFICIENCY, FRACTURES): Vit D, 25-Hydroxy: 27.4 ng/mL — ABNORMAL LOW (ref 30.0–100.0)

## 2024-03-30 LAB — VITAMIN B12: Vitamin B-12: 298 pg/mL (ref 232–1245)

## 2024-04-02 ENCOUNTER — Other Ambulatory Visit: Payer: Self-pay | Admitting: Family Medicine

## 2024-04-05 NOTE — Telephone Encounter (Signed)
 Requested Prescriptions  Pending Prescriptions Disp Refills   rosuvastatin  (CRESTOR ) 5 MG tablet [Pharmacy Med Name: Rosuvastatin  Calcium  5 MG Oral Tablet] 90 tablet 3    Sig: Take 1 tablet by mouth once daily     Cardiovascular:  Antilipid - Statins 2 Failed - 04/05/2024  1:27 PM      Failed - Lipid Panel in normal range within the last 12 months    Cholesterol, Total  Date Value Ref Range Status  03/29/2024 142 100 - 199 mg/dL Final   LDL Chol Calc (NIH)  Date Value Ref Range Status  03/29/2024 53 0 - 99 mg/dL Final   HDL  Date Value Ref Range Status  03/29/2024 72 >39 mg/dL Final   Triglycerides  Date Value Ref Range Status  03/29/2024 92 0 - 149 mg/dL Final         Passed - Cr in normal range and within 360 days    Creatinine, Ser  Date Value Ref Range Status  03/29/2024 0.92 0.57 - 1.00 mg/dL Final         Passed - Patient is not pregnant      Passed - Valid encounter within last 12 months    Recent Outpatient Visits           1 week ago Chronic fatigue   [place] [facility] [place], Megan P, DO   2 weeks ago Moderate recurrent major depression (HCC)   [place] [facility] [place], Megan P, DO   1 month ago Moderate recurrent major depression [facility])   [place] [facility] [place], Megan P, DO   1 month ago OSA (obstructive sleep apnea)   [place] [facility], Megan P, DO   3 months ago Moderate recurrent major depression [facility])   [place] [facility] [place], [place], DO

## 2024-04-13 ENCOUNTER — Encounter: Payer: Self-pay | Admitting: Family Medicine

## 2024-04-20 MED ORDER — CARIPRAZINE HCL 3 MG PO CAPS: 3.0000 mg | ORAL_CAPSULE | Freq: Every day | ORAL | 2 refills | Status: DC

## 2024-04-30 ENCOUNTER — Other Ambulatory Visit: Payer: Self-pay

## 2024-05-06 ENCOUNTER — Encounter: Payer: Self-pay | Admitting: Family Medicine

## 2024-05-06 DIAGNOSIS — F411 Generalized anxiety disorder: Secondary | ICD-10-CM

## 2024-05-06 DIAGNOSIS — F331 Major depressive disorder, recurrent, moderate: Secondary | ICD-10-CM

## 2024-05-06 DIAGNOSIS — R41840 Attention and concentration deficit: Secondary | ICD-10-CM

## 2024-05-17 ENCOUNTER — Other Ambulatory Visit: Payer: Self-pay | Admitting: Family Medicine

## 2024-05-18 ENCOUNTER — Other Ambulatory Visit: Payer: Self-pay | Admitting: Medical Genetics

## 2024-05-18 ENCOUNTER — Encounter: Payer: Self-pay | Admitting: Psychology

## 2024-05-18 NOTE — Telephone Encounter (Signed)
 Requested Prescriptions  Pending Prescriptions Disp Refills   escitalopram  (LEXAPRO ) 20 MG tablet [Pharmacy Med Name: Escitalopram  Oxalate 20 MG Oral Tablet] 90 tablet 1    Sig: Take 1 tablet by mouth once daily     Psychiatry:  Antidepressants - SSRI Passed - 05/18/2024  1:56 PM      Passed - Completed PHQ-2 or PHQ-9 in the last 360 days      Passed - Valid encounter within last 6 months    Recent Outpatient Visits           1 month ago Chronic fatigue   [place] [facility] [place], Megan P, DO   2 months ago Moderate recurrent major depression [facility])   [place] [facility] [place], Megan P, DO   3 months ago Moderate recurrent major depression [facility])   [place] [facility] [place], Megan P, DO   3 months ago OSA (obstructive sleep apnea)   [place] [facility], Megan P, DO   4 months ago Moderate recurrent major depression [name])    [place] Vicci Duwaine SQUIBB, DO       Future Appointments             In 4 months Rodenbough, Norleen SAUNDERS, PsyD [facility], CPR

## 2024-05-19 ENCOUNTER — Other Ambulatory Visit

## 2024-06-08 DIAGNOSIS — F411 Generalized anxiety disorder: Secondary | ICD-10-CM | POA: Diagnosis not present

## 2024-06-08 DIAGNOSIS — F331 Major depressive disorder, recurrent, moderate: Secondary | ICD-10-CM | POA: Diagnosis not present

## 2024-07-07 DIAGNOSIS — F411 Generalized anxiety disorder: Secondary | ICD-10-CM | POA: Diagnosis not present

## 2024-07-07 DIAGNOSIS — F331 Major depressive disorder, recurrent, moderate: Secondary | ICD-10-CM | POA: Diagnosis not present

## 2024-07-30 ENCOUNTER — Encounter: Payer: Self-pay | Admitting: Internal Medicine

## 2024-08-06 ENCOUNTER — Other Ambulatory Visit: Payer: Self-pay | Admitting: Family Medicine

## 2024-08-09 NOTE — Telephone Encounter (Signed)
 Too soon for refill.  Requested Prescriptions  Pending Prescriptions Disp Refills   pantoprazole  (PROTONIX ) 40 MG tablet [Pharmacy Med Name: Pantoprazole  Sodium 40 MG Oral Tablet Delayed Release] 90 tablet 0    Sig: Take 1 tablet by mouth once daily     Gastroenterology: Proton Pump Inhibitors Passed - 08/09/2024  1:59 PM      Passed - Valid encounter within last 12 months    Recent Outpatient Visits           4 months ago Chronic fatigue   [place] [facility] [place], Megan P, DO   4 months ago Moderate recurrent major depression [facility])   [place] [facility] [place], Megan P, DO   5 months ago Moderate recurrent major depression [facility])   [place] [facility] [place], Megan P, DO   6 months ago OSA (obstructive sleep apnea)   [place] [facility], Megan P, DO   7 months ago Moderate recurrent major depression [facility])   [place] [facility] Vicci Duwaine SQUIBB, DO       Future Appointments             In 1 month Rodenbough, Norleen SAUNDERS, PsyD [facility], CPR

## 2024-08-10 ENCOUNTER — Other Ambulatory Visit: Payer: Self-pay | Admitting: Medical Genetics

## 2024-08-10 DIAGNOSIS — Z006 Encounter for examination for normal comparison and control in clinical research program: Secondary | ICD-10-CM

## 2024-08-24 ENCOUNTER — Encounter: Payer: Self-pay | Admitting: Family Medicine

## 2024-08-31 NOTE — Telephone Encounter (Signed)
Scheduled with karen

## 2024-08-31 NOTE — Telephone Encounter (Signed)
 appt

## 2024-09-01 ENCOUNTER — Encounter: Payer: Self-pay | Admitting: Nurse Practitioner

## 2024-09-01 ENCOUNTER — Ambulatory Visit: Admitting: Nurse Practitioner

## 2024-09-01 VITALS — BP 104/72 | HR 74 | Temp 98.3°F | Ht 68.0 in | Wt 196.4 lb

## 2024-09-01 DIAGNOSIS — R0982 Postnasal drip: Secondary | ICD-10-CM | POA: Diagnosis not present

## 2024-09-01 MED ORDER — MONTELUKAST SODIUM 10 MG PO TABS
10.0000 mg | ORAL_TABLET | Freq: Every day | ORAL | 3 refills | Status: AC
Start: 2024-09-01 — End: ?

## 2024-09-01 MED ORDER — PANTOPRAZOLE SODIUM 40 MG PO TBEC: 40.0000 mg | DELAYED_RELEASE_TABLET | Freq: Every day | ORAL | 1 refills | Status: DC

## 2024-09-01 MED ORDER — IPRATROPIUM BROMIDE 0.03 % NA SOLN: 2.0000 | Freq: Two times a day (BID) | NASAL | 0 refills | Status: DC

## 2024-09-01 NOTE — Progress Notes (Signed)
 BP 104/72   Pulse 74   Temp 98.3 F (36.8 C) (Oral)   Ht 5' 8 (1.727 m)   Wt 196 lb 6.4 oz (89.1 kg)   SpO2 98%   BMI 29.86 kg/m    Subjective:    Patient ID: Sherri Kelly, female    DOB: 1976/08/03, 48 y.o.   MRN: [id number]  HPI: Sherri Kelly is a 48 y.o. female  Chief Complaint  Patient presents with   Cough    Patient states she has been having a dry cough since June. States she feels like she coughs sometimes when her nose gets stuffy. States this does not happen constantly.    COUGH Patient states she started with a cough around June. States its not coming from her test.  She has taken Advil cold and sinus, Flonase, afrin, humidifier, zyrtec, Neti pot, Claritin and allegra.  She has been taking zyrtec everyday for awhile since using the medication. She does feel like it is mostly a dry hacking cough.  Sometimes she does cough up a little bit of mucus.    She does have some acid reflux.  But doesn't bother her much.  She has been out of her pantoprazole  for about a month but it hasn't worsened in that time.    Relevant past medical, surgical, family and social history reviewed and updated as indicated. Interim medical history since our last visit reviewed. Allergies and medications reviewed and updated.  Review of Systems  HENT:  Positive for congestion and postnasal drip.   Respiratory:  Positive for cough.     Per HPI unless specifically indicated above     Objective:    BP 104/72   Pulse 74   Temp 98.3 F (36.8 C) (Oral)   Ht 5' 8 (1.727 m)   Wt 196 lb 6.4 oz (89.1 kg)   SpO2 98%   BMI 29.86 kg/m   Wt Readings from Last 3 Encounters:  09/01/24 196 lb 6.4 oz (89.1 kg)  03/29/24 187 lb (84.8 kg)  03/19/24 190 lb (86.2 kg)    Physical Exam Vitals and nursing note reviewed.  Constitutional:      General: She is not in acute distress.    Appearance: Normal appearance. She is normal weight. She is not ill-appearing, toxic-appearing or diaphoretic.   HENT:     Head: Normocephalic.     Right Ear: External ear normal. A middle ear effusion is present.     Left Ear: External ear normal. A middle ear effusion is present.     Nose: Rhinorrhea present. No congestion.     Mouth/Throat:     Mouth: Mucous membranes are moist.     Pharynx: Oropharynx is clear. Posterior oropharyngeal erythema present. No oropharyngeal exudate.  Eyes:     General:        Right eye: No discharge.        Left eye: No discharge.     Extraocular Movements: Extraocular movements intact.     Conjunctiva/sclera: Conjunctivae normal.     Pupils: Pupils are equal, round, and reactive to light.  Cardiovascular:     Rate and Rhythm: Normal rate and regular rhythm.     Heart sounds: No murmur heard. Pulmonary:     Effort: Pulmonary effort is normal. No respiratory distress.     Breath sounds: Normal breath sounds. No wheezing or rales.  Musculoskeletal:     Cervical back: Normal range of motion and neck supple.  Skin:  General: Skin is warm and dry.     Capillary Refill: Capillary refill takes less than 2 seconds.  Neurological:     General: No focal deficit present.     Mental Status: She is alert and oriented to person, place, and time. Mental status is at baseline.  Psychiatric:        Mood and Affect: Mood normal.        Behavior: Behavior normal.        Thought Content: Thought content normal.        Judgment: Judgment normal.     Results for orders placed or performed in visit on 03/29/24  Comprehensive metabolic panel with GFR   Collection Time: 03/29/24  1:19 PM  Result Value Ref Range   Glucose 95 70 - 99 mg/dL   BUN 13 6 - 24 mg/dL   Creatinine, Ser 9.07 0.57 - 1.00 mg/dL   eGFR 77 >40 fO/fpw/8.26   BUN/Creatinine Ratio 14 9 - 23   Sodium 140 134 - 144 mmol/L   Potassium 4.0 3.5 - 5.2 mmol/L   Chloride 102 96 - 106 mmol/L   CO2 23 20 - 29 mmol/L   Calcium  9.8 8.7 - 10.2 mg/dL   Total Protein 7.1 6.0 - 8.5 g/dL   Albumin 4.3 3.9 - 4.9  g/dL   Globulin, Total 2.8 1.5 - 4.5 g/dL   Bilirubin Total 0.4 0.0 - 1.2 mg/dL   Alkaline Phosphatase 94 44 - 121 IU/L   AST 18 0 - 40 IU/L   ALT 13 0 - 32 IU/L  CBC with Differential/Platelet   Collection Time: 03/29/24  1:19 PM  Result Value Ref Range   WBC 4.9 3.4 - 10.8 x10E3/uL   RBC 4.21 3.77 - 5.28 x10E6/uL   Hemoglobin 12.6 11.1 - 15.9 g/dL   Hematocrit 61.2 65.9 - 46.6 %   MCV 92 79 - 97 fL   MCH 29.9 26.6 - 33.0 pg   MCHC 32.6 31.5 - 35.7 g/dL   RDW 88.2 88.2 - 84.5 %   Platelets 265 150 - 450 x10E3/uL   Neutrophils 60 Not Estab. %   Lymphs 31 Not Estab. %   Monocytes 7 Not Estab. %   Eos 1 Not Estab. %   Basos 1 Not Estab. %   Neutrophils Absolute 2.9 1.4 - 7.0 x10E3/uL   Lymphocytes Absolute 1.5 0.7 - 3.1 x10E3/uL   Monocytes Absolute 0.3 0.1 - 0.9 x10E3/uL   EOS (ABSOLUTE) 0.1 0.0 - 0.4 x10E3/uL   Basophils Absolute 0.0 0.0 - 0.2 x10E3/uL   Immature Granulocytes 0 Not Estab. %   Immature Grans (Abs) 0.0 0.0 - 0.1 x10E3/uL  VITAMIN D  25 Hydroxy (Vit-D Deficiency, Fractures)   Collection Time: 03/29/24  1:19 PM  Result Value Ref Range   Vit D, 25-Hydroxy 27.4 (L) 30.0 - 100.0 ng/mL  TSH   Collection Time: 03/29/24  1:19 PM  Result Value Ref Range   TSH 2.110 0.450 - 4.500 uIU/mL  B12   Collection Time: 03/29/24  1:19 PM  Result Value Ref Range   Vitamin B-12 298 232 - 1,245 pg/mL  Lipid Panel w/o Chol/HDL Ratio   Collection Time: 03/29/24  1:19 PM  Result Value Ref Range   Cholesterol, Total 142 100 - 199 mg/dL   Triglycerides 92 0 - 149 mg/dL   HDL 72 >60 mg/dL   VLDL Cholesterol Cal 17 5 - 40 mg/dL   LDL Chol Calc (NIH) 53 0 - 99 mg/dL  Assessment & Plan:   Problem List Items Addressed This Visit   None Visit Diagnoses       Post-nasal drip    -  Primary   Continue with Zyrtec. Start Singulair and Ipratropium nasal spray. Discussed black box warning for singular and symptoms to monitor for. Follow up in 1 month.        Follow up  plan: Return in about 1 month (around 10/01/2024) for FU cough with Dr. JINNY.

## 2024-09-11 DIAGNOSIS — F411 Generalized anxiety disorder: Secondary | ICD-10-CM | POA: Diagnosis not present

## 2024-09-11 DIAGNOSIS — F331 Major depressive disorder, recurrent, moderate: Secondary | ICD-10-CM | POA: Diagnosis not present

## 2024-10-06 ENCOUNTER — Encounter: Admitting: Psychology

## 2024-10-06 ENCOUNTER — Encounter: Payer: Self-pay | Admitting: Family Medicine

## 2024-10-06 ENCOUNTER — Ambulatory Visit: Admitting: Family Medicine

## 2024-10-06 VITALS — BP 120/80 | HR 76 | Temp 98.0°F | Ht 68.0 in | Wt 195.0 lb

## 2024-10-06 DIAGNOSIS — F331 Major depressive disorder, recurrent, moderate: Secondary | ICD-10-CM

## 2024-10-06 DIAGNOSIS — E559 Vitamin D deficiency, unspecified: Secondary | ICD-10-CM | POA: Diagnosis not present

## 2024-10-06 DIAGNOSIS — F411 Generalized anxiety disorder: Secondary | ICD-10-CM | POA: Diagnosis not present

## 2024-10-06 DIAGNOSIS — Z1231 Encounter for screening mammogram for malignant neoplasm of breast: Secondary | ICD-10-CM | POA: Diagnosis not present

## 2024-10-06 DIAGNOSIS — R0982 Postnasal drip: Secondary | ICD-10-CM | POA: Diagnosis not present

## 2024-10-06 DIAGNOSIS — Z0000 Encounter for general adult medical examination without abnormal findings: Secondary | ICD-10-CM

## 2024-10-06 DIAGNOSIS — E782 Mixed hyperlipidemia: Secondary | ICD-10-CM | POA: Diagnosis not present

## 2024-10-06 MED ORDER — ROSUVASTATIN CALCIUM 5 MG PO TABS: 5.0000 mg | ORAL_TABLET | Freq: Every day | ORAL | 3 refills | Status: AC

## 2024-10-06 MED ORDER — TRETINOIN 0.05 % EX CREA: TOPICAL_CREAM | Freq: Every day | CUTANEOUS | 1 refills | Status: AC

## 2024-10-06 MED ORDER — PANTOPRAZOLE SODIUM 40 MG PO TBEC: 40.0000 mg | DELAYED_RELEASE_TABLET | Freq: Every day | ORAL | 1 refills | Status: AC

## 2024-10-06 MED ORDER — IPRATROPIUM BROMIDE 0.03 % NA SOLN: 2.0000 | Freq: Two times a day (BID) | NASAL | 3 refills | Status: AC

## 2024-10-06 MED ORDER — MONTELUKAST SODIUM 10 MG PO TABS: 10.0000 mg | ORAL_TABLET | Freq: Every day | ORAL | 1 refills | Status: AC

## 2024-10-06 NOTE — Progress Notes (Signed)
 BP 120/80   Pulse 76   Temp 98 F (36.7 C) (Oral)   Ht 5' 8 (1.727 m)   Wt 195 lb (88.5 kg)   SpO2 98%   BMI 29.65 kg/m    Subjective:    Patient ID: Sherri Kelly, female    DOB: 06-18-1976, 48 y.o.   MRN: [id number]  HPI: Sherri Kelly is a 48 y.o. female presenting on 10/06/2024 for comprehensive medical examination. Current medical complaints include:  Did well with her medicine for a couple of weeks following her medication, but then started feeling worse again in the last 2 weeks. She denies feeling sick. She notes that her nose gets really stuffy and she will cough.   ANXIETY/DEPRESSION- following with psychiatry now. Doing well Duration: chronic Status:better Anxious mood: yes  Excessive worrying: no Irritability: no  Sweating: no Nausea: no Palpitations:no Hyperventilation: no Panic attacks: no Agoraphobia: no  Obscessions/compulsions: no Depressed mood: yes    09/01/2024    8:52 AM 03/29/2024    1:30 PM 03/19/2024   10:43 AM 02/18/2024    8:02 AM 02/06/2024    1:54 PM  Depression screen PHQ 2/9  Decreased Interest 1 1 3 3 3   Down, Depressed, Hopeless 1 3 3 1 3   PHQ - 2 Score 2 4 6 4 6   Altered sleeping 2 3 3 3 3   Tired, decreased energy 3 3 3 3 3   Change in appetite 1  0 1 2  Feeling bad or failure about yourself  1 1 2 2 3   Trouble concentrating 1 1 3 3 3   Moving slowly or fidgety/restless 2 1 1 1 1   Suicidal thoughts 0 0 0 0 0  PHQ-9 Score 12 13  18  17  21    Difficult doing work/chores Somewhat difficult Very difficult Extremely dIfficult Very difficult Extremely dIfficult     Data saved with a previous flowsheet row definition   Anhedonia: no Weight changes: no Insomnia: no   Hypersomnia: no Fatigue/loss of energy: no Feelings of worthlessness: no Feelings of guilt: no Impaired concentration/indecisiveness: no Suicidal ideations: no  Crying spells: no Recent Stressors/Life Changes: no   Relationship problems: no   Family stress: no      Financial stress: no    Job stress: no    Recent death/loss: no  HYPERLIPIDEMIA Hyperlipidemia status: excellent compliance Satisfied with current treatment?  yes Side effects:  no Medication compliance: excellent compliance Past cholesterol meds: crestor  Supplements: none Aspirin:  no The 10-year ASCVD risk score (Arnett DK, et al., 2019) is: 0.4%   Values used to calculate the score:     Age: 31 years     Clinically relevant sex: Female     Is Non-Hispanic African American: No     Diabetic: No     Tobacco smoker: No     Systolic Blood Pressure: 120 mmHg     Is BP treated: No     HDL Cholesterol: 72 mg/dL     Total Cholesterol: 142 mg/dL Chest pain:  no Coronary artery disease:  no  Menopausal Symptoms: no  Depression Screen done today and results listed below:     09/01/2024    8:52 AM 03/29/2024    1:30 PM 03/19/2024   10:43 AM 02/18/2024    8:02 AM 02/06/2024    1:54 PM  Depression screen PHQ 2/9  Decreased Interest 1 1 3 3 3   Down, Depressed, Hopeless 1 3 3 1 3   PHQ - 2  Score 2 4 6 4 6   Altered sleeping 2 3 3 3 3   Tired, decreased energy 3 3 3 3 3   Change in appetite 1  0 1 2  Feeling bad or failure about yourself  1 1 2 2 3   Trouble concentrating 1 1 3 3 3   Moving slowly or fidgety/restless 2 1 1 1 1   Suicidal thoughts 0 0 0 0 0  PHQ-9 Score 12 13  18  17  21    Difficult doing work/chores Somewhat difficult Very difficult Extremely dIfficult Very difficult Extremely dIfficult     Data saved with a previous flowsheet row definition    Past Medical History:  Past Medical History:  Diagnosis Date   Anxiety 2020   Depression 2020   Mixed hyperlipidemia 03/29/2024   Plantar fasciitis    Sleep apnea 2024   currently untreated    Surgical History:  Past Surgical History:  Procedure Laterality Date   NO PAST SURGERIES      Medications:  Current Outpatient Medications on File Prior to Visit  Medication Sig   ergocalciferol  (VITAMIN D2) 1.25 MG (50000  UT) capsule Take 1 capsule (50,000 Units total) by mouth once a week.   traZODone  (DESYREL ) 100 MG tablet Take 100 mg by mouth at bedtime.   TRINTELLIX 20 MG TABS tablet Take 20 mg by mouth daily.   buPROPion  (WELLBUTRIN  SR) 200 MG 12 hr tablet Take 1 tablet (200 mg total) by mouth 2 (two) times daily.   No current facility-administered medications on file prior to visit.    Allergies:  Allergies[1]  Social History:  Social History   Socioeconomic History   Marital status: Single    Spouse name: Not on file   Number of children: Not on file   Years of education: Not on file   Highest education level: GED or equivalent  Occupational History   Not on file  Tobacco Use   Smoking status: Never   Smokeless tobacco: Never   Tobacco comments:    Have never smoked a cigarette.  Vaping Use   Vaping status: Never Used  Substance and Sexual Activity   Alcohol use: Yes    Comment: Occasionally   Drug use: Never   Sexual activity: Not Currently    Birth control/protection: Abstinence, Post-menopausal    Comment: Havent had a period in several years  Other Topics Concern   Not on file  Social History Narrative   Not on file   Social Drivers of Health   Tobacco Use: Low Risk (10/06/2024)   Patient History    Smoking Tobacco Use: Never    Smokeless Tobacco Use: Never    Passive Exposure: Not on file  Financial Resource Strain: Medium Risk (08/31/2024)   Overall Financial Resource Strain (CARDIA)    Difficulty of Paying Living Expenses: Somewhat hard  Food Insecurity: Food Insecurity Present (08/31/2024)   Epic    Worried About Programme Researcher, Broadcasting/film/video in the Last Year: Sometimes true    Ran Out of Food in the Last Year: Never true  Transportation Needs: No Transportation Needs (08/31/2024)   Epic    Lack of Transportation (Medical): No    Lack of Transportation (Non-Medical): No  Physical Activity: Insufficiently Active (10/06/2024)   Exercise Vital Sign    Days of Exercise  per Week: 2 days    Minutes of Exercise per Session: 20 min  Stress: Stress Concern Present (08/31/2024)   [employer] of Occupational Health - Occupational Stress Questionnaire  Feeling of Stress: To some extent  Social Connections: Moderately Isolated (08/31/2024)   Social Connection and Isolation Panel    Frequency of Communication with Friends and Family: More than three times a week    Frequency of Social Gatherings with Friends and Family: More than three times a week    Attends Religious Services: 1 to 4 times per year    Active Member of [employer] or Organizations: No    Attends Engineer, Structural: Not on file    Marital Status: Never married  Intimate Partner Violence: Not At Risk (10/06/2024)   Epic    Fear of Current or Ex-Partner: No    Emotionally Abused: No    Physically Abused: No    Sexually Abused: No  Depression (PHQ2-9): High Risk (09/01/2024)   Depression (PHQ2-9)    PHQ-2 Score: 12  Alcohol Screen: Low Risk (08/31/2024)   Alcohol Screen    Last Alcohol Screening Score (AUDIT): 1  Housing: High Risk (08/31/2024)   Epic    Unable to Pay for Housing in the Last Year: Yes    Number of Times Moved in the Last Year: 0    Homeless in the Last Year: No  Utilities: Not At Risk (10/06/2024)   Epic    Threatened with loss of utilities: No  Health Literacy: Adequate Health Literacy (10/06/2024)   B1300 Health Literacy    Frequency of need for help with medical instructions: Never   Tobacco Use History[2] Social History   Substance and Sexual Activity  Alcohol Use Yes   Comment: Occasionally    Family History:  Family History  Problem Relation Age of Onset   Cancer Mother 20       Lung   Alcohol abuse Mother    Varicose Veins Mother    Hypertension Father    Alcohol abuse Father    Depression Father    Obesity Father    Stroke Father    Varicose Veins Father    Thyroid  disease Sister    ADD / ADHD Sister    ADD / ADHD Sister    ADD /  ADHD Daughter    ADD / ADHD Daughter    Anxiety disorder Paternal Aunt    COPD Paternal Aunt    Depression Paternal Aunt    Diabetes Maternal Grandmother    Diabetes Paternal Grandmother    Obesity Paternal Grandmother    Heart disease Paternal Grandmother    Rheum arthritis Brother    Gallbladder disease Brother    Arthritis Brother    Heart attack Paternal Grandfather        Was a heavy drinker from what I am told. Died way before I was born.   Breast cancer Neg Hx     Past medical history, surgical history, medications, allergies, family history and social history reviewed with patient today and changes made to appropriate areas of the chart.   Review of Systems  Constitutional:  Positive for diaphoresis. Negative for chills, fever, malaise/fatigue and weight loss.  HENT:  Positive for congestion. Negative for ear discharge, ear pain, hearing loss, nosebleeds, sinus pain, sore throat and tinnitus.   Eyes: Negative.   Respiratory:  Positive for cough. Negative for hemoptysis, sputum production, shortness of breath, wheezing and stridor.   Cardiovascular: Negative.   Gastrointestinal:  Positive for abdominal pain. Negative for blood in stool, constipation, diarrhea, heartburn, melena, nausea and vomiting.  Genitourinary: Negative.   Musculoskeletal: Negative.   Skin: Negative.   Neurological: Negative.  Endo/Heme/Allergies:  Positive for environmental allergies. Negative for polydipsia. Does not bruise/bleed easily.  Psychiatric/Behavioral:  Negative for depression, hallucinations, memory loss, substance abuse and suicidal ideas. The patient is nervous/anxious. The patient does not have insomnia.    All other ROS negative except what is listed above and in the HPI.      Objective:    BP 120/80   Pulse 76   Temp 98 F (36.7 C) (Oral)   Ht 5' 8 (1.727 m)   Wt 195 lb (88.5 kg)   SpO2 98%   BMI 29.65 kg/m   Wt Readings from Last 3 Encounters:  10/06/24 195 lb (88.5 kg)   09/01/24 196 lb 6.4 oz (89.1 kg)  03/29/24 187 lb (84.8 kg)    Physical Exam Vitals and nursing note reviewed.  Constitutional:      General: She is not in acute distress.    Appearance: Normal appearance. She is not ill-appearing, toxic-appearing or diaphoretic.  HENT:     Head: Normocephalic and atraumatic.     Right Ear: Tympanic membrane, ear canal and external ear normal. There is no impacted cerumen.     Left Ear: Tympanic membrane, ear canal and external ear normal. There is no impacted cerumen.     Nose: Nose normal. No congestion or rhinorrhea.     Mouth/Throat:     Mouth: Mucous membranes are moist.     Pharynx: Oropharynx is clear. No oropharyngeal exudate or posterior oropharyngeal erythema.  Eyes:     General: No scleral icterus.       Right eye: No discharge.        Left eye: No discharge.     Extraocular Movements: Extraocular movements intact.     Conjunctiva/sclera: Conjunctivae normal.     Pupils: Pupils are equal, round, and reactive to light.  Neck:     Vascular: No carotid bruit.  Cardiovascular:     Rate and Rhythm: Normal rate and regular rhythm.     Pulses: Normal pulses.     Heart sounds: No murmur heard.    No friction rub. No gallop.  Pulmonary:     Effort: Pulmonary effort is normal. No respiratory distress.     Breath sounds: Normal breath sounds. No stridor. No wheezing, rhonchi or rales.  Chest:     Chest wall: No tenderness.  Abdominal:     General: Abdomen is flat. Bowel sounds are normal. There is no distension.     Palpations: Abdomen is soft. There is no mass.     Tenderness: There is no abdominal tenderness. There is no right CVA tenderness, left CVA tenderness, guarding or rebound.     Hernia: No hernia is present.  Genitourinary:    Comments: Breast and pelvic exams deferred with shared decision making Musculoskeletal:        General: No swelling, tenderness, deformity or signs of injury.     Cervical back: Normal range of motion  and neck supple. No rigidity. No muscular tenderness.     Right lower leg: No edema.     Left lower leg: No edema.  Lymphadenopathy:     Cervical: No cervical adenopathy.  Skin:    General: Skin is warm and dry.     Capillary Refill: Capillary refill takes less than 2 seconds.     Coloration: Skin is not jaundiced or pale.     Findings: No bruising, erythema, lesion or rash.  Neurological:     General: No focal deficit present.  Mental Status: She is alert and oriented to person, place, and time. Mental status is at baseline.     Cranial Nerves: No cranial nerve deficit.     Sensory: No sensory deficit.     Motor: No weakness.     Coordination: Coordination normal.     Gait: Gait normal.     Deep Tendon Reflexes: Reflexes normal.  Psychiatric:        Mood and Affect: Mood normal.        Behavior: Behavior normal.        Thought Content: Thought content normal.        Judgment: Judgment normal.     Results for orders placed or performed in visit on 03/29/24  Comprehensive metabolic panel with GFR   Collection Time: 03/29/24  1:19 PM  Result Value Ref Range   Glucose 95 70 - 99 mg/dL   BUN 13 6 - 24 mg/dL   Creatinine, Ser 9.07 0.57 - 1.00 mg/dL   eGFR 77 >40 fO/fpw/8.26   BUN/Creatinine Ratio 14 9 - 23   Sodium 140 134 - 144 mmol/L   Potassium 4.0 3.5 - 5.2 mmol/L   Chloride 102 96 - 106 mmol/L   CO2 23 20 - 29 mmol/L   Calcium  9.8 8.7 - 10.2 mg/dL   Total Protein 7.1 6.0 - 8.5 g/dL   Albumin 4.3 3.9 - 4.9 g/dL   Globulin, Total 2.8 1.5 - 4.5 g/dL   Bilirubin Total 0.4 0.0 - 1.2 mg/dL   Alkaline Phosphatase 94 44 - 121 IU/L   AST 18 0 - 40 IU/L   ALT 13 0 - 32 IU/L  CBC with Differential/Platelet   Collection Time: 03/29/24  1:19 PM  Result Value Ref Range   WBC 4.9 3.4 - 10.8 x10E3/uL   RBC 4.21 3.77 - 5.28 x10E6/uL   Hemoglobin 12.6 11.1 - 15.9 g/dL   Hematocrit 61.2 65.9 - 46.6 %   MCV 92 79 - 97 fL   MCH 29.9 26.6 - 33.0 pg   MCHC 32.6 31.5 - 35.7 g/dL    RDW 88.2 88.2 - 84.5 %   Platelets 265 150 - 450 x10E3/uL   Neutrophils 60 Not Estab. %   Lymphs 31 Not Estab. %   Monocytes 7 Not Estab. %   Eos 1 Not Estab. %   Basos 1 Not Estab. %   Neutrophils Absolute 2.9 1.4 - 7.0 x10E3/uL   Lymphocytes Absolute 1.5 0.7 - 3.1 x10E3/uL   Monocytes Absolute 0.3 0.1 - 0.9 x10E3/uL   EOS (ABSOLUTE) 0.1 0.0 - 0.4 x10E3/uL   Basophils Absolute 0.0 0.0 - 0.2 x10E3/uL   Immature Granulocytes 0 Not Estab. %   Immature Grans (Abs) 0.0 0.0 - 0.1 x10E3/uL  VITAMIN D  25 Hydroxy (Vit-D Deficiency, Fractures)   Collection Time: 03/29/24  1:19 PM  Result Value Ref Range   Vit D, 25-Hydroxy 27.4 (L) 30.0 - 100.0 ng/mL  TSH   Collection Time: 03/29/24  1:19 PM  Result Value Ref Range   TSH 2.110 0.450 - 4.500 uIU/mL  B12   Collection Time: 03/29/24  1:19 PM  Result Value Ref Range   Vitamin B-12 298 232 - 1,245 pg/mL  Lipid Panel w/o Chol/HDL Ratio   Collection Time: 03/29/24  1:19 PM  Result Value Ref Range   Cholesterol, Total 142 100 - 199 mg/dL   Triglycerides 92 0 - 149 mg/dL   HDL 72 >60 mg/dL   VLDL Cholesterol Cal 17 5 -  40 mg/dL   LDL Chol Calc (NIH) 53 0 - 99 mg/dL      Assessment & Plan:   Problem List Items Addressed This Visit       Other   Generalized anxiety disorder   Following with psych. Continue to monitor with them. Call with any concerns.       Relevant Medications   buPROPion  (WELLBUTRIN  SR) 200 MG 12 hr tablet   Moderate recurrent major depression (HCC)   Following with psych. Continue to monitor with them. Call with any concerns.       Relevant Medications   buPROPion  (WELLBUTRIN  SR) 200 MG 12 hr tablet   Mixed hyperlipidemia   Under good control on current regimen. Continue current regimen. Continue to monitor. Call with any concerns. Refills given. Labs drawn today.        Relevant Medications   rosuvastatin  (CRESTOR ) 5 MG tablet   Other Visit Diagnoses       Routine general medical examination at a  health care facility    -  Primary   Vaccines up to date/declined. Screening labs checked today. Continue diet and exercise. Call with any concerns.   Relevant Orders   CBC with Differential/Platelet   Comprehensive metabolic panel with GFR   Lipid Panel w/o Chol/HDL Ratio   TSH   Hepatitis B surface antibody,quantitative     Post-nasal drip       Was better, then got worse again. Will give it another few weeks, if not better, will refer to ENT/allergy.     Vitamin D  deficiency       Rechecking labs today. Await results. Treat as needed.   Relevant Orders   VITAMIN D  25 Hydroxy (Vit-D Deficiency, Fractures)     Encounter for screening mammogram for malignant neoplasm of breast       Mammo ordered today.   Relevant Orders   MM 3D SCREENING MAMMOGRAM BILATERAL BREAST        Follow up plan: Return in about 1 year (around 10/06/2025) for physical.   LABORATORY TESTING:  - Pap smear: up to date  IMMUNIZATIONS:   - Tdap: Tetanus vaccination status reviewed: last tetanus booster within 10 years. - Influenza: Refused - Prevnar: Refused - COVID: Refused - HPV: Not applicable - Shingrix vaccine: Not applicable  SCREENING: -Mammogram: Ordered today  - Colonoscopy: Up to date    PATIENT COUNSELING:   Advised to take 1 mg of folate supplement per day if capable of pregnancy.   Sexuality: Discussed sexually transmitted diseases, partner selection, use of condoms, avoidance of unintended pregnancy  and contraceptive alternatives.   Advised to avoid cigarette smoking.  I discussed with the patient that most people either abstain from alcohol or drink within safe limits (<=14/week and <=4 drinks/occasion for males, <=7/weeks and <= 3 drinks/occasion for females) and that the risk for alcohol disorders and other health effects rises proportionally with the number of drinks per week and how often a drinker exceeds daily limits.  Discussed cessation/primary prevention of drug use and  availability of treatment for abuse.   Diet: Encouraged to adjust caloric intake to maintain  or achieve ideal body weight, to reduce intake of dietary saturated fat and total fat, to limit sodium intake by avoiding high sodium foods and not adding table salt, and to maintain adequate dietary potassium and calcium  preferably from fresh fruits, vegetables, and low-fat dairy products.    stressed the importance of regular exercise  Injury prevention: Discussed safety belts, safety  helmets, smoke detector, smoking near bedding or upholstery.   Dental health: Discussed importance of regular tooth brushing, flossing, and dental visits.    NEXT PREVENTATIVE PHYSICAL DUE IN 1 YEAR. Return in about 1 year (around 10/06/2025) for physical.              [1] No Known Allergies [2]  Social History Tobacco Use  Smoking Status Never  Smokeless Tobacco Never  Tobacco Comments   Have never smoked a cigarette.

## 2024-10-06 NOTE — Assessment & Plan Note (Signed)
 Following with psych. Continue to monitor with them. Call with any concerns.

## 2024-10-06 NOTE — Assessment & Plan Note (Signed)
 Under good control on current regimen. Continue current regimen. Continue to monitor. Call with any concerns. Refills given. Labs drawn today.

## 2024-10-07 ENCOUNTER — Telehealth: Payer: Self-pay

## 2024-10-07 ENCOUNTER — Ambulatory Visit: Payer: Self-pay | Admitting: Family Medicine

## 2024-10-07 DIAGNOSIS — E559 Vitamin D deficiency, unspecified: Secondary | ICD-10-CM

## 2024-10-07 LAB — COMPREHENSIVE METABOLIC PANEL WITH GFR
ALT: 14 IU/L (ref 0–32)
AST: 19 IU/L (ref 0–40)
Albumin: 4.1 g/dL (ref 3.9–4.9)
Alkaline Phosphatase: 98 IU/L (ref 41–116)
BUN/Creatinine Ratio: 8 — ABNORMAL LOW (ref 9–23)
BUN: 8 mg/dL (ref 6–24)
Bilirubin Total: 0.4 mg/dL (ref 0.0–1.2)
CO2: 26 mmol/L (ref 20–29)
Calcium: 9.6 mg/dL (ref 8.7–10.2)
Chloride: 105 mmol/L (ref 96–106)
Creatinine, Ser: 0.99 mg/dL (ref 0.57–1.00)
Globulin, Total: 2.3 g/dL (ref 1.5–4.5)
Glucose: 83 mg/dL (ref 70–99)
Potassium: 3.7 mmol/L (ref 3.5–5.2)
Sodium: 143 mmol/L (ref 134–144)
Total Protein: 6.4 g/dL (ref 6.0–8.5)
eGFR: 70 mL/min/1.73 (ref >59)

## 2024-10-07 LAB — CBC WITH DIFFERENTIAL/PLATELET
Basophils Absolute: 0.1 x10E3/uL (ref 0.0–0.2)
Basos: 1 %
EOS (ABSOLUTE): 0.1 x10E3/uL (ref 0.0–0.4)
Eos: 3 %
Hematocrit: 37.9 % (ref 34.0–46.6)
Hemoglobin: 12.8 g/dL (ref 11.1–15.9)
Immature Grans (Abs): 0 x10E3/uL (ref 0.0–0.1)
Immature Granulocytes: 0 %
Lymphocytes Absolute: 1 x10E3/uL (ref 0.7–3.1)
Lymphs: 25 %
MCH: 30.3 pg (ref 26.6–33.0)
MCHC: 33.8 g/dL (ref 31.5–35.7)
MCV: 90 fL (ref 79–97)
Monocytes Absolute: 0.3 x10E3/uL (ref 0.1–0.9)
Monocytes: 7 %
Neutrophils Absolute: 2.7 x10E3/uL (ref 1.4–7.0)
Neutrophils: 64 %
Platelets: 318 x10E3/uL (ref 150–450)
RBC: 4.22 x10E6/uL (ref 3.77–5.28)
RDW: 12 % (ref 11.7–15.4)
WBC: 4.2 x10E3/uL (ref 3.4–10.8)

## 2024-10-07 LAB — LIPID PANEL W/O CHOL/HDL RATIO
Cholesterol, Total: 150 mg/dL (ref 100–199)
HDL: 74 mg/dL (ref >39)
LDL Chol Calc (NIH): 63 mg/dL (ref 0–99)
Triglycerides: 66 mg/dL (ref 0–149)
VLDL Cholesterol Cal: 13 mg/dL (ref 5–40)

## 2024-10-07 LAB — TSH: TSH: 1.59 u[IU]/mL (ref 0.450–4.500)

## 2024-10-07 LAB — VITAMIN D 25 HYDROXY (VIT D DEFICIENCY, FRACTURES): Vit D, 25-Hydroxy: 19.3 ng/mL — ABNORMAL LOW (ref 30.0–100.0)

## 2024-10-07 LAB — HEPATITIS B SURFACE ANTIBODY, QUANTITATIVE: Hepatitis B Surf Ab Quant: 369 m[IU]/mL

## 2024-10-07 MED ORDER — ERGOCALCIFEROL 1.25 MG (50000 UT) PO CAPS: 50000.0000 [IU] | ORAL_CAPSULE | ORAL | 3 refills | Status: AC

## 2024-10-07 NOTE — Telephone Encounter (Signed)
 Pharmacy Patient Advocate Encounter   Received notification from Onbase that prior authorization for Tretinoin  0.05% cream is required/requested.   Insurance verification completed.   The patient is insured through [facility]   Per test claim: PA required; PA submitted to above mentioned insurance via Latent Key/confirmation #/EOC [id number] Status is pending

## 2024-10-08 ENCOUNTER — Other Ambulatory Visit (HOSPITAL_COMMUNITY): Payer: Self-pay

## 2024-10-08 NOTE — Telephone Encounter (Signed)
 Pharmacy Patient Advocate Encounter  Received notification from [facility] that Prior Authorization for Tretinoin  0.05% cream has been DENIED.  Full denial letter will be uploaded to the media tab. See denial reason below.    PA #/Case ID/Reference #: Full denial letter has been uploaded to pt's media tab

## 2024-10-24 ENCOUNTER — Encounter: Payer: Self-pay | Admitting: Family Medicine

## 2024-10-25 NOTE — Telephone Encounter (Signed)
 appt

## 2024-10-25 NOTE — Telephone Encounter (Signed)
"  Appt scheduled  "

## 2024-10-29 ENCOUNTER — Ambulatory Visit: Admitting: Family Medicine

## 2024-10-29 ENCOUNTER — Encounter: Payer: Self-pay | Admitting: Family Medicine

## 2024-10-29 VITALS — BP 118/79 | HR 67 | Temp 98.4°F | Ht 68.0 in | Wt 186.0 lb

## 2024-10-29 DIAGNOSIS — N951 Menopausal and female climacteric states: Secondary | ICD-10-CM | POA: Diagnosis not present

## 2024-10-29 MED ORDER — COMBIPATCH 0.05-0.14 MG/DAY TD PTTW: 1.0000 | MEDICATED_PATCH | TRANSDERMAL | 12 refills | Status: DC

## 2024-10-29 NOTE — Assessment & Plan Note (Signed)
 Will treat with combipatch  and recheck in about a month. Call with any concerns.

## 2024-10-29 NOTE — Progress Notes (Signed)
 "  BP 118/79   Pulse 67   Temp 98.4 F (36.9 C) (Oral)   Ht 5' 8 (1.727 m)   Wt 186 lb (84.4 kg)   SpO2 98%   BMI 28.28 kg/m    Subjective:    Patient ID: Sherri Kelly, female    DOB: [birth year], 49 y.o.   MRN: [id number]  HPI: Sherri Kelly is a 49 y.o. female  Chief Complaint  Patient presents with   Menopause   MENOPAUSAL SYMPTOMS- has been having issues with depression and anxiety since she started having issues with menopause. She notes that she has been talking with friends and would like to do something to treat it.  Duration: about 5-7 years Duration: chronic Status: exacerbated Symptom severity: moderate Hot flashes: yes- 1x a week Night sweats: yes- a couple a week Sleep disturbances: yes Vaginal dryness: yes Dyspareunia: N/A Decreased libido: no Emotional lability: yes Stress incontinence: yes Previous HRT/pharmacotherapy: no Hysterectomy: no Average interval between menses: about 2 weeks before IUD Length of menses: at least 7 years ago Absolute Contraindications to Hormonal Therapy:     Undiagnosed vaginal bleeding: no    Breast cancer: no    Endometrial cancer: no    Coronary disease: no    Cerebrovascular disease: no    Venous thromboembolic disease: no   Relevant past medical, surgical, family and social history reviewed and updated as indicated. Interim medical history since our last visit reviewed. Allergies and medications reviewed and updated.  Review of Systems  Constitutional: Negative.   Respiratory: Negative.    Cardiovascular: Negative.   Gastrointestinal: Negative.   Musculoskeletal: Negative.   Skin: Negative.   Psychiatric/Behavioral:  Positive for dysphoric mood and sleep disturbance. Negative for agitation, behavioral problems, confusion, decreased concentration, hallucinations, self-injury and suicidal ideas. The patient is nervous/anxious. The patient is not hyperactive.     Per HPI unless specifically indicated above      Objective:    BP 118/79   Pulse 67   Temp 98.4 F (36.9 C) (Oral)   Ht 5' 8 (1.727 m)   Wt 186 lb (84.4 kg)   SpO2 98%   BMI 28.28 kg/m   Wt Readings from Last 3 Encounters:  10/29/24 186 lb (84.4 kg)  10/06/24 195 lb (88.5 kg)  09/01/24 196 lb 6.4 oz (89.1 kg)    Physical Exam Vitals and nursing note reviewed.  Constitutional:      General: She is not in acute distress.    Appearance: Normal appearance. She is not ill-appearing, toxic-appearing or diaphoretic.  HENT:     Head: Normocephalic and atraumatic.     Right Ear: External ear normal.     Left Ear: External ear normal.     Nose: Nose normal.     Mouth/Throat:     Mouth: Mucous membranes are moist.     Pharynx: Oropharynx is clear.  Eyes:     General: No scleral icterus.       Right eye: No discharge.        Left eye: No discharge.     Extraocular Movements: Extraocular movements intact.     Conjunctiva/sclera: Conjunctivae normal.     Pupils: Pupils are equal, round, and reactive to light.  Cardiovascular:     Rate and Rhythm: Normal rate and regular rhythm.     Pulses: Normal pulses.     Heart sounds: Normal heart sounds. No murmur heard.    No friction rub. No gallop.  Pulmonary:  Effort: Pulmonary effort is normal. No respiratory distress.     Breath sounds: Normal breath sounds. No stridor. No wheezing, rhonchi or rales.  Chest:     Chest wall: No tenderness.  Musculoskeletal:        General: Normal range of motion.     Cervical back: Normal range of motion and neck supple.  Skin:    General: Skin is warm and dry.     Capillary Refill: Capillary refill takes less than 2 seconds.     Coloration: Skin is not jaundiced or pale.     Findings: No bruising, erythema, lesion or rash.  Neurological:     General: No focal deficit present.     Mental Status: She is alert and oriented to person, place, and time. Mental status is at baseline.  Psychiatric:        Mood and Affect: Mood normal.         Behavior: Behavior normal.        Thought Content: Thought content normal.        Judgment: Judgment normal.     Results for orders placed or performed in visit on 10/06/24  CBC with Differential/Platelet   Collection Time: 10/06/24 10:44 AM  Result Value Ref Range   WBC 4.2 3.4 - 10.8 x10E3/uL   RBC 4.22 3.77 - 5.28 x10E6/uL   Hemoglobin 12.8 11.1 - 15.9 g/dL   Hematocrit 62.0 65.9 - 46.6 %   MCV 90 79 - 97 fL   MCH 30.3 26.6 - 33.0 pg   MCHC 33.8 31.5 - 35.7 g/dL   RDW 87.9 88.2 - 84.5 %   Platelets 318 150 - 450 x10E3/uL   Neutrophils 64 Not Estab. %   Lymphs 25 Not Estab. %   Monocytes 7 Not Estab. %   Eos 3 Not Estab. %   Basos 1 Not Estab. %   Neutrophils Absolute 2.7 1.4 - 7.0 x10E3/uL   Lymphocytes Absolute 1.0 0.7 - 3.1 x10E3/uL   Monocytes Absolute 0.3 0.1 - 0.9 x10E3/uL   EOS (ABSOLUTE) 0.1 0.0 - 0.4 x10E3/uL   Basophils Absolute 0.1 0.0 - 0.2 x10E3/uL   Immature Granulocytes 0 Not Estab. %   Immature Grans (Abs) 0.0 0.0 - 0.1 x10E3/uL  Comprehensive metabolic panel with GFR   Collection Time: 10/06/24 10:44 AM  Result Value Ref Range   Glucose 83 70 - 99 mg/dL   BUN 8 6 - 24 mg/dL   Creatinine, Ser 9.00 0.57 - 1.00 mg/dL   eGFR 70 >40 fO/fpw/8.26   BUN/Creatinine Ratio 8 (L) 9 - 23   Sodium 143 134 - 144 mmol/L   Potassium 3.7 3.5 - 5.2 mmol/L   Chloride 105 96 - 106 mmol/L   CO2 26 20 - 29 mmol/L   Calcium  9.6 8.7 - 10.2 mg/dL   Total Protein 6.4 6.0 - 8.5 g/dL   Albumin 4.1 3.9 - 4.9 g/dL   Globulin, Total 2.3 1.5 - 4.5 g/dL   Bilirubin Total 0.4 0.0 - 1.2 mg/dL   Alkaline Phosphatase 98 41 - 116 IU/L   AST 19 0 - 40 IU/L   ALT 14 0 - 32 IU/L  Lipid Panel w/o Chol/HDL Ratio   Collection Time: 10/06/24 10:44 AM  Result Value Ref Range   Cholesterol, Total 150 100 - 199 mg/dL   Triglycerides 66 0 - 149 mg/dL   HDL 74 >60 mg/dL   VLDL Cholesterol Cal 13 5 - 40 mg/dL   LDL Chol Calc (  NIH) 63 0 - 99 mg/dL  TSH   Collection Time: 10/06/24 10:44 AM   Result Value Ref Range   TSH 1.590 0.450 - 4.500 uIU/mL  Hepatitis B surface antibody,quantitative   Collection Time: 10/06/24 10:44 AM  Result Value Ref Range   Hepatitis B Surf Ab Quant 369.0 Immunity>10 mIU/mL  VITAMIN D  25 Hydroxy (Vit-D Deficiency, Fractures)   Collection Time: 10/06/24 10:44 AM  Result Value Ref Range   Vit D, 25-Hydroxy 19.3 (L) 30.0 - 100.0 ng/mL      Assessment & Plan:   Problem List Items Addressed This Visit       Other   Menopausal symptom - Primary   Will treat with combipatch  and recheck in about a month. Call with any concerns.         Follow up plan: Return in about 6 weeks (around 12/10/2024).      "

## 2024-11-01 ENCOUNTER — Telehealth: Payer: Self-pay

## 2024-11-01 ENCOUNTER — Other Ambulatory Visit (HOSPITAL_COMMUNITY): Payer: Self-pay

## 2024-11-01 NOTE — Telephone Encounter (Signed)
 Copied from CRM #[id number]. Topic: Clinical - Medication Prior Auth >> Nov 01, 2024 10:27 AM Myrick T wrote: Reason for CRM: patient called stated insurance needs a prior auth for the script estradiol -norethindrone (COMBIPATCH ) 0.05-0.14 MG/DAY. Please advise.

## 2024-11-01 NOTE — Telephone Encounter (Signed)
 Pharmacy Patient Advocate Encounter   Received notification from Pt Calls Messages that prior authorization for CombiPatch  0.05-0.14MG /DAY biweekly patches is required/requested.   Insurance verification completed.   The patient is insured through [facility].   Per test claim: PA required; PA submitted to above mentioned insurance via Latent Key/confirmation #/EOC [id number] Status is pending

## 2024-11-02 ENCOUNTER — Other Ambulatory Visit (HOSPITAL_COMMUNITY): Payer: Self-pay

## 2024-11-04 ENCOUNTER — Other Ambulatory Visit: Payer: Self-pay | Admitting: Family Medicine

## 2024-11-04 MED ORDER — CLIMARA PRO 0.045-0.015 MG/DAY TD PTWK: 1.0000 | MEDICATED_PATCH | TRANSDERMAL | 12 refills | Status: AC

## 2024-11-09 NOTE — Telephone Encounter (Signed)
 Pharmacy Patient Advocate Encounter  Received notification from [facility] that Prior Authorization for CombiPatch  0.05-0.14MG /DAY biweekly patches  has been DENIED.  Full denial letter will be uploaded to the media tab. See denial reason below.   PA #/Case ID/Reference #: [id number]

## 2024-11-12 ENCOUNTER — Ambulatory Visit: Admitting: Family Medicine

## 2024-12-01 ENCOUNTER — Ambulatory Visit: Admitting: Family Medicine

## 2025-01-19 ENCOUNTER — Ambulatory Visit: Admitting: Family Medicine

## 5848-06-21 DEATH — deceased
